# Patient Record
Sex: Female | Born: 1965 | Race: White | Hispanic: No | Smoking: Never smoker
Health system: Southern US, Community
[De-identification: ages and names within clinical notes are randomized; demographics above are authoritative.]

## PROBLEM LIST (undated history)

## (undated) DIAGNOSIS — C50919 Malignant neoplasm of unspecified site of unspecified female breast: Secondary | ICD-10-CM

## (undated) DIAGNOSIS — I509 Heart failure, unspecified: Secondary | ICD-10-CM

## (undated) DIAGNOSIS — D7581 Myelofibrosis: Secondary | ICD-10-CM

## (undated) DIAGNOSIS — C9590 Leukemia, unspecified not having achieved remission: Secondary | ICD-10-CM

## (undated) HISTORY — DX: Leukemia, unspecified not having achieved remission: C95.90

## (undated) HISTORY — PX: BREAST SURGERY: SHX581

## (undated) HISTORY — PX: ABDOMINAL HYSTERECTOMY: SHX81

---

## 1998-12-31 ENCOUNTER — Ambulatory Visit (HOSPITAL_COMMUNITY): Admission: RE | Admit: 1998-12-31 | Discharge: 1998-12-31 | Payer: Self-pay | Admitting: *Deleted

## 1999-01-09 ENCOUNTER — Inpatient Hospital Stay (HOSPITAL_COMMUNITY): Admission: RE | Admit: 1999-01-09 | Discharge: 1999-01-10 | Payer: Self-pay | Admitting: *Deleted

## 1999-02-18 ENCOUNTER — Encounter: Payer: Self-pay | Admitting: Oncology

## 1999-02-18 ENCOUNTER — Ambulatory Visit (HOSPITAL_COMMUNITY): Admission: RE | Admit: 1999-02-18 | Discharge: 1999-02-18 | Payer: Self-pay | Admitting: Oncology

## 1999-08-05 ENCOUNTER — Other Ambulatory Visit: Admission: RE | Admit: 1999-08-05 | Discharge: 1999-08-05 | Payer: Self-pay | Admitting: Obstetrics & Gynecology

## 1999-09-25 ENCOUNTER — Ambulatory Visit (HOSPITAL_BASED_OUTPATIENT_CLINIC_OR_DEPARTMENT_OTHER): Admission: RE | Admit: 1999-09-25 | Discharge: 1999-09-25 | Payer: Self-pay | Admitting: Plastic Surgery

## 2000-10-04 ENCOUNTER — Other Ambulatory Visit: Admission: RE | Admit: 2000-10-04 | Discharge: 2000-10-04 | Payer: Self-pay | Admitting: Obstetrics & Gynecology

## 2001-04-15 ENCOUNTER — Encounter (INDEPENDENT_AMBULATORY_CARE_PROVIDER_SITE_OTHER): Payer: Self-pay

## 2001-04-15 ENCOUNTER — Observation Stay (HOSPITAL_COMMUNITY): Admission: RE | Admit: 2001-04-15 | Discharge: 2001-04-16 | Payer: Self-pay | Admitting: Obstetrics & Gynecology

## 2002-08-17 ENCOUNTER — Other Ambulatory Visit: Admission: RE | Admit: 2002-08-17 | Discharge: 2002-08-17 | Payer: Self-pay | Admitting: Obstetrics & Gynecology

## 2003-10-05 ENCOUNTER — Other Ambulatory Visit: Admission: RE | Admit: 2003-10-05 | Discharge: 2003-10-05 | Payer: Self-pay | Admitting: Obstetrics & Gynecology

## 2004-10-28 ENCOUNTER — Other Ambulatory Visit: Admission: RE | Admit: 2004-10-28 | Discharge: 2004-10-28 | Payer: Self-pay | Admitting: Obstetrics & Gynecology

## 2004-10-29 ENCOUNTER — Ambulatory Visit: Payer: Self-pay | Admitting: Oncology

## 2005-04-29 ENCOUNTER — Ambulatory Visit: Payer: Self-pay | Admitting: Oncology

## 2005-06-26 ENCOUNTER — Ambulatory Visit: Payer: Self-pay | Admitting: Oncology

## 2005-09-10 ENCOUNTER — Ambulatory Visit: Payer: Self-pay | Admitting: Oncology

## 2005-11-23 DIAGNOSIS — C9590 Leukemia, unspecified not having achieved remission: Secondary | ICD-10-CM

## 2005-11-23 HISTORY — DX: Leukemia, unspecified not having achieved remission: C95.90

## 2005-12-15 ENCOUNTER — Other Ambulatory Visit: Admission: RE | Admit: 2005-12-15 | Discharge: 2005-12-15 | Payer: Self-pay | Admitting: Obstetrics & Gynecology

## 2006-01-04 ENCOUNTER — Ambulatory Visit: Payer: Self-pay | Admitting: Oncology

## 2006-02-19 ENCOUNTER — Ambulatory Visit: Payer: Self-pay | Admitting: Oncology

## 2006-05-17 ENCOUNTER — Ambulatory Visit: Payer: Self-pay | Admitting: Oncology

## 2006-05-20 LAB — CBC WITH DIFFERENTIAL/PLATELET
Basophils Absolute: 0 10*3/uL (ref 0.0–0.1)
Eosinophils Absolute: 0.1 10*3/uL (ref 0.0–0.5)
HCT: 37.5 % (ref 34.8–46.6)
HGB: 12.8 g/dL (ref 11.6–15.9)
MONO#: 0.4 10*3/uL (ref 0.1–0.9)
NEUT#: 5.4 10*3/uL (ref 1.5–6.5)
NEUT%: 70.7 % (ref 39.6–76.8)
WBC: 7.6 10*3/uL (ref 3.9–10.0)
lymph#: 1.8 10*3/uL (ref 0.9–3.3)

## 2006-09-14 ENCOUNTER — Ambulatory Visit: Payer: Self-pay | Admitting: Oncology

## 2006-09-16 LAB — CBC WITH DIFFERENTIAL/PLATELET
BASO%: 0.2 % (ref 0.0–2.0)
HCT: 36.3 % (ref 34.8–46.6)
LYMPH%: 22.5 % (ref 14.0–48.0)
MCH: 29 pg (ref 26.0–34.0)
MCHC: 34.2 g/dL (ref 32.0–36.0)
MONO#: 0.3 10*3/uL (ref 0.1–0.9)
NEUT%: 71.6 % (ref 39.6–76.8)
Platelets: 989 10*3/uL — ABNORMAL HIGH (ref 145–400)
WBC: 6.6 10*3/uL (ref 3.9–10.0)

## 2006-09-20 LAB — COMPREHENSIVE METABOLIC PANEL
ALT: 20 U/L (ref 0–40)
BUN: 11 mg/dL (ref 6–23)
CO2: 25 mEq/L (ref 19–32)
Creatinine, Ser: 0.65 mg/dL (ref 0.40–1.20)
Total Bilirubin: 0.7 mg/dL (ref 0.3–1.2)

## 2006-09-20 LAB — LACTATE DEHYDROGENASE: LDH: 440 U/L — ABNORMAL HIGH (ref 94–250)

## 2006-09-20 LAB — JAK2 GENOTYPR

## 2007-02-17 ENCOUNTER — Ambulatory Visit: Payer: Self-pay | Admitting: Oncology

## 2007-03-14 LAB — CBC WITH DIFFERENTIAL/PLATELET
Basophils Absolute: 0.1 10*3/uL (ref 0.0–0.1)
EOS%: 0.8 % (ref 0.0–7.0)
Eosinophils Absolute: 0.1 10*3/uL (ref 0.0–0.5)
HCT: 34.9 % (ref 34.8–46.6)
HGB: 12 g/dL (ref 11.6–15.9)
MCH: 29.4 pg (ref 26.0–34.0)
MCV: 85.4 fL (ref 81.0–101.0)
MONO%: 5.6 % (ref 0.0–13.0)
NEUT#: 5.4 10*3/uL (ref 1.5–6.5)
NEUT%: 73.7 % (ref 39.6–76.8)

## 2007-09-08 ENCOUNTER — Ambulatory Visit: Payer: Self-pay | Admitting: Oncology

## 2007-11-15 ENCOUNTER — Ambulatory Visit: Payer: Self-pay | Admitting: Oncology

## 2007-11-21 LAB — CBC WITH DIFFERENTIAL/PLATELET
Basophils Absolute: 0.1 10*3/uL (ref 0.0–0.1)
EOS%: 0.6 % (ref 0.0–7.0)
HGB: 11.2 g/dL — ABNORMAL LOW (ref 11.6–15.9)
LYMPH%: 14.6 % (ref 14.0–48.0)
MCH: 29.8 pg (ref 26.0–34.0)
MCV: 86.6 fL (ref 81.0–101.0)
MONO%: 4.1 % (ref 0.0–13.0)
Platelets: 502 10*3/uL — ABNORMAL HIGH (ref 145–400)
RBC: 3.77 10*6/uL (ref 3.70–5.32)
RDW: 17.9 % — ABNORMAL HIGH (ref 11.3–14.5)

## 2007-11-23 ENCOUNTER — Other Ambulatory Visit: Admission: RE | Admit: 2007-11-23 | Discharge: 2007-11-23 | Payer: Self-pay | Admitting: Oncology

## 2007-11-23 ENCOUNTER — Encounter: Payer: Self-pay | Admitting: Oncology

## 2007-11-23 LAB — COMPREHENSIVE METABOLIC PANEL
ALT: 12 U/L (ref 0–35)
AST: 16 U/L (ref 0–37)
Albumin: 4.4 g/dL (ref 3.5–5.2)
Alkaline Phosphatase: 50 U/L (ref 39–117)
BUN: 8 mg/dL (ref 6–23)
CO2: 25 mEq/L (ref 19–32)
Calcium: 9.3 mg/dL (ref 8.4–10.5)
Chloride: 104 mEq/L (ref 96–112)
Creatinine, Ser: 0.61 mg/dL (ref 0.40–1.20)
Glucose, Bld: 105 mg/dL — ABNORMAL HIGH (ref 70–99)
Potassium: 3.9 mEq/L (ref 3.5–5.3)
Sodium: 141 mEq/L (ref 135–145)
Total Bilirubin: 0.6 mg/dL (ref 0.3–1.2)
Total Protein: 7 g/dL (ref 6.0–8.3)

## 2007-12-23 LAB — CBC & DIFF AND RETIC
Basophils Absolute: 0 10*3/uL (ref 0.0–0.1)
Eosinophils Absolute: 0 10*3/uL (ref 0.0–0.5)
HCT: 33.7 % — ABNORMAL LOW (ref 34.8–46.6)
HGB: 11.3 g/dL — ABNORMAL LOW (ref 11.6–15.9)
LYMPH%: 19.3 % (ref 14.0–48.0)
MONO#: 0.3 10*3/uL (ref 0.1–0.9)
NEUT#: 5.4 10*3/uL (ref 1.5–6.5)
NEUT%: 75.4 % (ref 39.6–76.8)
Platelets: 555 10*3/uL — ABNORMAL HIGH (ref 145–400)
RBC: 3.88 10*6/uL (ref 3.70–5.32)
Retic %: 2.8 % — ABNORMAL HIGH (ref 0.4–2.3)
WBC: 7.1 10*3/uL (ref 3.9–10.0)

## 2007-12-23 LAB — CHCC SMEAR

## 2008-01-18 ENCOUNTER — Ambulatory Visit: Payer: Self-pay | Admitting: Oncology

## 2008-02-23 ENCOUNTER — Ambulatory Visit: Payer: Self-pay | Admitting: Internal Medicine

## 2008-02-23 DIAGNOSIS — L03211 Cellulitis of face: Secondary | ICD-10-CM

## 2008-02-23 DIAGNOSIS — L0201 Cutaneous abscess of face: Secondary | ICD-10-CM | POA: Insufficient documentation

## 2008-02-23 DIAGNOSIS — D7581 Myelofibrosis: Secondary | ICD-10-CM | POA: Insufficient documentation

## 2008-03-09 ENCOUNTER — Telehealth (INDEPENDENT_AMBULATORY_CARE_PROVIDER_SITE_OTHER): Payer: Self-pay | Admitting: *Deleted

## 2008-04-17 ENCOUNTER — Ambulatory Visit: Payer: Self-pay | Admitting: Oncology

## 2008-04-19 LAB — CBC WITH DIFFERENTIAL/PLATELET
Eosinophils Absolute: 0.1 10*3/uL (ref 0.0–0.5)
HCT: 32.4 % — ABNORMAL LOW (ref 34.8–46.6)
LYMPH%: 16.9 % (ref 14.0–48.0)
MONO#: 0.4 10*3/uL (ref 0.1–0.9)
NEUT#: 5.5 10*3/uL (ref 1.5–6.5)
NEUT%: 76.1 % (ref 39.6–76.8)
Platelets: 501 10*3/uL — ABNORMAL HIGH (ref 145–400)
WBC: 7.3 10*3/uL (ref 3.9–10.0)

## 2008-07-17 ENCOUNTER — Ambulatory Visit: Payer: Self-pay | Admitting: Oncology

## 2008-07-19 LAB — CBC WITH DIFFERENTIAL/PLATELET
Basophils Absolute: 0 10*3/uL (ref 0.0–0.1)
EOS%: 0.6 % (ref 0.0–7.0)
Eosinophils Absolute: 0.1 10*3/uL (ref 0.0–0.5)
HGB: 11.4 g/dL — ABNORMAL LOW (ref 11.6–15.9)
LYMPH%: 15.4 % (ref 14.0–48.0)
MCH: 30.3 pg (ref 26.0–34.0)
MCV: 87.8 fL (ref 81.0–101.0)
MONO%: 6.4 % (ref 0.0–13.0)
Platelets: 440 10*3/uL — ABNORMAL HIGH (ref 145–400)
RBC: 3.78 10*6/uL (ref 3.70–5.32)
RDW: 17.3 % — ABNORMAL HIGH (ref 11.3–14.5)

## 2008-10-05 ENCOUNTER — Ambulatory Visit: Payer: Self-pay | Admitting: Oncology

## 2008-10-09 ENCOUNTER — Ambulatory Visit (HOSPITAL_COMMUNITY): Admission: RE | Admit: 2008-10-09 | Discharge: 2008-10-09 | Payer: Self-pay | Admitting: Oncology

## 2008-10-09 LAB — CBC WITH DIFFERENTIAL/PLATELET
BASO%: 1 % (ref 0.0–2.0)
Basophils Absolute: 0.1 10*3/uL (ref 0.0–0.1)
EOS%: 0.6 % (ref 0.0–7.0)
HCT: 35.2 % (ref 34.8–46.6)
HGB: 11.8 g/dL (ref 11.6–15.9)
LYMPH%: 16.9 % (ref 14.0–48.0)
MCH: 29.4 pg (ref 26.0–34.0)
MCHC: 33.5 g/dL (ref 32.0–36.0)
MCV: 87.6 fL (ref 81.0–101.0)
NEUT%: 76.5 % (ref 39.6–76.8)
Platelets: 371 10*3/uL (ref 145–400)

## 2008-12-06 ENCOUNTER — Ambulatory Visit: Payer: Self-pay | Admitting: Oncology

## 2008-12-10 LAB — CBC WITH DIFFERENTIAL/PLATELET
Basophils Absolute: 0 10*3/uL (ref 0.0–0.1)
Eosinophils Absolute: 0.1 10*3/uL (ref 0.0–0.5)
HCT: 33.7 % — ABNORMAL LOW (ref 34.8–46.6)
HGB: 11.4 g/dL — ABNORMAL LOW (ref 11.6–15.9)
LYMPH%: 16.5 % (ref 14.0–48.0)
MCHC: 33.8 g/dL (ref 32.0–36.0)
MONO#: 0.1 10*3/uL (ref 0.1–0.9)
NEUT#: 5.9 10*3/uL (ref 1.5–6.5)
NEUT%: 80.4 % — ABNORMAL HIGH (ref 39.6–76.8)
Platelets: 421 10*3/uL — ABNORMAL HIGH (ref 145–400)
WBC: 7.3 10*3/uL (ref 3.9–10.0)

## 2009-03-15 ENCOUNTER — Ambulatory Visit: Payer: Self-pay | Admitting: Oncology

## 2009-03-15 LAB — CBC WITH DIFFERENTIAL/PLATELET
BASO%: 0 % (ref 0.0–2.0)
Basophils Absolute: 0 10*3/uL (ref 0.0–0.1)
EOS%: 0.3 % (ref 0.0–7.0)
HCT: 31.9 % — ABNORMAL LOW (ref 34.8–46.6)
HGB: 11 g/dL — ABNORMAL LOW (ref 11.6–15.9)
LYMPH%: 15.3 % (ref 14.0–49.7)
MCH: 29.6 pg (ref 25.1–34.0)
MCHC: 34.5 g/dL (ref 31.5–36.0)
NEUT%: 78.7 % — ABNORMAL HIGH (ref 38.4–76.8)
Platelets: 344 10*3/uL (ref 145–400)
lymph#: 1.1 10*3/uL (ref 0.9–3.3)

## 2009-06-06 ENCOUNTER — Ambulatory Visit: Payer: Self-pay | Admitting: Oncology

## 2009-06-25 LAB — CBC WITH DIFFERENTIAL/PLATELET
Basophils Absolute: 0 10*3/uL (ref 0.0–0.1)
Eosinophils Absolute: 0.1 10*3/uL (ref 0.0–0.5)
HCT: 35.2 % (ref 34.8–46.6)
HGB: 11.9 g/dL (ref 11.6–15.9)
LYMPH%: 17.4 % (ref 14.0–49.7)
MCV: 88.1 fL (ref 79.5–101.0)
MONO#: 0.5 10*3/uL (ref 0.1–0.9)
MONO%: 5.5 % (ref 0.0–14.0)
NEUT#: 6.6 10*3/uL — ABNORMAL HIGH (ref 1.5–6.5)
NEUT%: 76 % (ref 38.4–76.8)
Platelets: 393 10*3/uL (ref 145–400)
WBC: 8.7 10*3/uL (ref 3.9–10.3)

## 2009-10-22 ENCOUNTER — Ambulatory Visit: Payer: Self-pay | Admitting: Oncology

## 2009-10-24 LAB — CBC WITH DIFFERENTIAL/PLATELET
BASO%: 0.7 % (ref 0.0–2.0)
Basophils Absolute: 0.1 10*3/uL (ref 0.0–0.1)
Eosinophils Absolute: 0 10*3/uL (ref 0.0–0.5)
HCT: 33.6 % — ABNORMAL LOW (ref 34.8–46.6)
HGB: 10.9 g/dL — ABNORMAL LOW (ref 11.6–15.9)
LYMPH%: 15 % (ref 14.0–49.7)
MCHC: 32.4 g/dL (ref 31.5–36.0)
MONO#: 0.5 10*3/uL (ref 0.1–0.9)
NEUT%: 78.2 % — ABNORMAL HIGH (ref 38.4–76.8)
Platelets: 321 10*3/uL (ref 145–400)
WBC: 8.1 10*3/uL (ref 3.9–10.3)
lymph#: 1.2 10*3/uL (ref 0.9–3.3)

## 2009-12-11 ENCOUNTER — Ambulatory Visit: Payer: Self-pay | Admitting: Family Medicine

## 2009-12-11 DIAGNOSIS — R030 Elevated blood-pressure reading, without diagnosis of hypertension: Secondary | ICD-10-CM | POA: Insufficient documentation

## 2009-12-11 DIAGNOSIS — S20229A Contusion of unspecified back wall of thorax, initial encounter: Secondary | ICD-10-CM | POA: Insufficient documentation

## 2010-02-20 ENCOUNTER — Ambulatory Visit: Payer: Self-pay | Admitting: Oncology

## 2010-02-24 LAB — CBC WITH DIFFERENTIAL/PLATELET
HCT: 34.8 % (ref 34.8–46.6)
HGB: 12.1 g/dL (ref 11.6–15.9)
MCH: 31.6 pg (ref 25.1–34.0)
MCHC: 34.7 g/dL (ref 31.5–36.0)
MCV: 91.4 fL (ref 79.5–101.0)
Platelets: 369 10*3/uL (ref 145–400)
RBC: 3.81 10*6/uL (ref 3.70–5.45)
RDW: 17 % — ABNORMAL HIGH (ref 11.2–14.5)
WBC: 9.1 10*3/uL (ref 3.9–10.3)

## 2010-02-24 LAB — MANUAL DIFFERENTIAL
ALC: 1.1 10*3/uL (ref 0.9–3.3)
ANC (CHCC manual diff): 7.4 10*3/uL — ABNORMAL HIGH (ref 1.5–6.5)
Band Neutrophils: 16 % — ABNORMAL HIGH (ref 0–10)
Blasts: 1 % — ABNORMAL HIGH (ref 0–0)
LYMPH: 12 % — ABNORMAL LOW (ref 14–49)
Other Cell: 0 % (ref 0–0)
PROMYELO: 0 % (ref 0–0)
SEG: 53 % (ref 38–77)
Variant Lymph: 0 % (ref 0–0)
nRBC: 1 % — ABNORMAL HIGH (ref 0–0)

## 2010-02-24 LAB — CHCC SMEAR

## 2010-09-18 ENCOUNTER — Ambulatory Visit: Payer: Self-pay | Admitting: Oncology

## 2010-09-19 ENCOUNTER — Ambulatory Visit: Payer: Self-pay | Admitting: Internal Medicine

## 2010-09-22 LAB — MANUAL DIFFERENTIAL
ALC: 1.4 10*3/uL (ref 0.9–3.3)
ANC (CHCC manual diff): 7.5 10*3/uL — ABNORMAL HIGH (ref 1.5–6.5)
Band Neutrophils: 22 % — ABNORMAL HIGH (ref 0–10)
Basophil: 0 % (ref 0–2)
Blasts: 2 % — ABNORMAL HIGH (ref 0–0)
EOS: 0 % (ref 0–7)
LYMPH: 15 % (ref 14–49)
MONO: 5 % (ref 0–14)
Metamyelocytes: 8 % — ABNORMAL HIGH (ref 0–0)
Myelocytes: 8 % — ABNORMAL HIGH (ref 0–0)
Other Cell: 0 % (ref 0–0)
PLT EST: ADEQUATE
PROMYELO: 0 % (ref 0–0)
SEG: 40 % (ref 38–77)
Variant Lymph: 0 % (ref 0–0)
nRBC: 4 % — ABNORMAL HIGH (ref 0–0)

## 2010-09-22 LAB — CBC WITH DIFFERENTIAL/PLATELET
HCT: 37.4 % (ref 34.8–46.6)
HGB: 12.2 g/dL (ref 11.6–15.9)
MCH: 29.4 pg (ref 25.1–34.0)
MCHC: 32.6 g/dL (ref 31.5–36.0)
MCV: 90.1 fL (ref 79.5–101.0)
Platelets: 250 10*3/uL (ref 145–400)
RBC: 4.15 10*6/uL (ref 3.70–5.45)
RDW: 16.4 % — ABNORMAL HIGH (ref 11.2–14.5)
WBC: 9.6 10*3/uL (ref 3.9–10.3)

## 2010-12-11 ENCOUNTER — Ambulatory Visit: Payer: Self-pay | Admitting: Oncology

## 2010-12-15 LAB — CBC WITH DIFFERENTIAL/PLATELET
HCT: 33.8 % — ABNORMAL LOW (ref 34.8–46.6)
HGB: 11.4 g/dL — ABNORMAL LOW (ref 11.6–15.9)
MCH: 29.7 pg (ref 25.1–34.0)
MCHC: 33.9 g/dL (ref 31.5–36.0)
MCV: 87.6 fL (ref 79.5–101.0)
Platelets: 265 10*3/uL (ref 145–400)
RBC: 3.86 10*6/uL (ref 3.70–5.45)
RDW: 17 % — ABNORMAL HIGH (ref 11.2–14.5)
WBC: 9.4 10*3/uL (ref 3.9–10.3)

## 2010-12-15 LAB — MANUAL DIFFERENTIAL
ALC: 1.3 10*3/uL (ref 0.9–3.3)
ANC (CHCC manual diff): 7.4 10*3/uL — ABNORMAL HIGH (ref 1.5–6.5)
Band Neutrophils: 24 % — ABNORMAL HIGH (ref 0–10)
Basophil: 1 % (ref 0–2)
Blasts: 2 % — ABNORMAL HIGH (ref 0–0)
EOS: 2 % (ref 0–7)
LYMPH: 14 % (ref 14–49)
MONO: 2 % (ref 0–14)
Metamyelocytes: 4 % — ABNORMAL HIGH (ref 0–0)
Myelocytes: 5 % — ABNORMAL HIGH (ref 0–0)
Other Cell: 0 % (ref 0–0)
PLT EST: ADEQUATE
PROMYELO: 0 % (ref 0–0)
SEG: 46 % (ref 38–77)
Variant Lymph: 0 % (ref 0–0)
nRBC: 4 % — ABNORMAL HIGH (ref 0–0)

## 2010-12-25 NOTE — Assessment & Plan Note (Signed)
Summary: FALL / BACK PAIN // RS   Vital Signs:  Patient profile:   45 year old female BP sitting:   170 / 80  Serial Vital Signs/Assessments:  Time      Position  BP       Pulse  Resp  Temp     By                     170/80                         Evelena Peat MD   History of Present Illness: Acute visit. Larey Seat last Friday on ice and landed on her sacrum and lumbar area. Had some bruising and swelling right away. Pain is actually somewhat better today. Initially had some radiation down right lower extremity but no radiation now. Denies any numbness, weakness, or incontinence symptoms. Advil helps. Waking up frequently at night with pain at times when rolling over in bed.  Allergies: 1)  ! 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  2002 for uterine fibroids  Past Surgical History: Last updated: 02/23/2008 status post left mastectomy 2000 Hysterectomy 02 Mastectomy  00  Review of Systems      See HPI  Physical Exam  General:  Well-developed,well-nourished,in no acute distress; alert,appropriate and cooperative throughout examination Msk:  patient has fairly extensive bruising over the lower lumbar and sacral area. Localized diffuse tenderness. Straight leg raise is negative bilaterally. Able to flex and extend back. Neurologic:  full strength lower extremities and symmetric reflexes. Normal sensory function.   Impression & Recommendations:  Problem # 1:  CONTUSION OF BACK (ICD-922.31) suspect contusion. She is improved today compared a few days ago. Vicodin as needed for pain relief  Problem # 2:  ELEVATED BLOOD PRESSURE (ICD-796.2) Rec she have rechecked within the next couple of weeks and f/u with primary physician if still up.  Complete Medication  List: 1)  Avelox 400 Mg Tabs (Moxifloxacin hcl) 2)  Doxycycline Hyclate 100 Mg Caps (Doxycycline hyclate) .... One two times a day #20 3)  Hydrocodone-acetaminophen 5-325 Mg Tabs (Hydrocodone-acetaminophen) .Marland Kitchen.. 1-2 by mouth  q 4-6 hours as needed pain.  Patient Instructions: 1)  Followup with your primary physician in a couple weeks if no better. Prescriptions: HYDROCODONE-ACETAMINOPHEN 5-325 MG TABS (HYDROCODONE-ACETAMINOPHEN) 1-2 by mouth  q 4-6 hours as needed pain.  #30 x 0   Entered and Authorized by:   Evelena Peat MD   Signed by:   Evelena Peat MD on 12/11/2009   Method used:   Print then Give to Patient   RxID:   [id number]

## 2010-12-25 NOTE — Assessment & Plan Note (Signed)
Summary: BP F/U // RS----PT [facility] // RS   Vital Signs:  Patient profile:   45 year old female Weight:      165 pounds BMI:     25.94 Temp:     98.2 degrees F oral BP sitting:   162 / 84  (right arm) Cuff size:   regular  Vitals Entered By: Alfred Levins, CMA (September 19, 2010 1:04 PM) CC: bp f/u   CC:  bp f/u.  History of Present Illness: 45 year old patient who has enjoyed excellent health, although there have been some blood pressure concerns over the years.  She is followed closely by oncology due to myelofibrosis.  Serial blood pressure readings over the past couple weeks.  Have revealed high blood pressure readings approximately 50% of the time.  She has a strong family history of hypertension with her brother and both parents affected.  She describes her salt intake as generous  Current Medications (verified): 1)  None  Allergies (verified): 1)  ! Biaxin (Clarithromycin)  Past History:  Past Medical History: Reviewed history from 02/23/2008 and no changes required. 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  2002 for uterine fibroids  Past Surgical History: Reviewed history from 02/23/2008 and no changes required. status post left mastectomy 2000 Hysterectomy 02 Mastectomy  00  Physical Exam  General:  Well-developed,well-nourished,in no acute distress; alert,appropriate and cooperative throughout examination; 140/90 Head:  Normocephalic and atraumatic without obvious abnormalities. No apparent alopecia or balding. Eyes:  No corneal or conjunctival inflammation noted. EOMI. Perrla. Funduscopic exam benign, without hemorrhages, exudates or papilledema. Vision grossly normal. Mouth:  Oral mucosa and oropharynx without lesions or exudates.  Teeth in good repair. Neck:  No deformities, masses, or tenderness noted. Lungs:  Normal respiratory effort,  chest expands symmetrically. Lungs are clear to auscultation, no crackles or wheezes. Heart:  Normal rate and regular rhythm. S1 and S2 normal without gallop, murmur, click, rub or other extra sounds. Abdomen:  marked splenomegaly   Impression & Recommendations:  Problem # 1:  ELEVATED BLOOD PRESSURE (ICD-796.2) options were discussed, including initiating blood pressure medications today.  Have elected to moderate her salt intake and attempt to exercise more regularly.  She will continue to monitor her blood pressure carefully and if consistently higher than 150/9.  He will contact the office and initiate treatment.  Otherwise, will reassess in 6 weeks.  She has been asked to keep a diary of her blood pressure readings and to bring her home blood pressure monitor on her next visit  Patient Instructions: 1)  Limit your Sodium (Salt). 2)  It is important that you exercise regularly at least 20 minutes 5 times a week. If you develop chest pain, have severe difficulty breathing, or feel very tired , stop exercising immediately and seek medical attention. 3)  Check your Blood Pressure regularly. If it is above: 150/90 you should make an appointment. 4)  Please schedule a follow-up appointment in 2 months.   Orders Added: 1)  Est. Patient Level III [16109]

## 2011-03-23 ENCOUNTER — Other Ambulatory Visit: Payer: Self-pay | Admitting: Oncology

## 2011-03-23 ENCOUNTER — Encounter (HOSPITAL_BASED_OUTPATIENT_CLINIC_OR_DEPARTMENT_OTHER): Payer: PRIVATE HEALTH INSURANCE | Admitting: Oncology

## 2011-03-23 DIAGNOSIS — R161 Splenomegaly, not elsewhere classified: Secondary | ICD-10-CM

## 2011-03-23 DIAGNOSIS — Z853 Personal history of malignant neoplasm of breast: Secondary | ICD-10-CM

## 2011-03-23 DIAGNOSIS — D7581 Myelofibrosis: Secondary | ICD-10-CM

## 2011-03-23 LAB — MANUAL DIFFERENTIAL
Band Neutrophils: 23 % — ABNORMAL HIGH (ref 0–10)
LYMPH: 18 % (ref 14–49)
Metamyelocytes: 7 % — ABNORMAL HIGH (ref 0–0)
SEG: 37 % — ABNORMAL LOW (ref 38–77)

## 2011-03-23 LAB — CBC WITH DIFFERENTIAL/PLATELET
HCT: 31.6 % — ABNORMAL LOW (ref 34.8–46.6)
HGB: 10.7 g/dL — ABNORMAL LOW (ref 11.6–15.9)
MCH: 29.2 pg (ref 25.1–34.0)
MCV: 86.3 fL (ref 79.5–101.0)
Platelets: 264 10*3/uL (ref 145–400)
RDW: 16 % — ABNORMAL HIGH (ref 11.2–14.5)
WBC: 6.4 10*3/uL (ref 3.9–10.3)

## 2011-04-10 NOTE — Op Note (Signed)
[facility]  Patient:    [name], [name]                     MRN: [id number] Proc. Date: 04/15/01 Adm. Date:  [id number] Disc. Date: [id number] Attending:  Minette Headland                           Operative Report  PREOPERATIVE DIAGNOSES:       Uterine leiomyomata.  POSTOPERATIVE DIAGNOSES:      Uterine leiomyomata.  PROCEDURE:                    Laparoscopically assisted vaginal hysterectomy.  SURGEON:                      Freddy Finner, M.D.  ASSISTANT:                    Marcelle Overlie, M.D.  ESTIMATED BLOOD LOSS:         700 cc.  COMPLICATIONS:                None.  INDICATIONS:                  The patient is a 45 year old breast cancer survivor who has had significant dysfunctional bleeding with large uterine leiomyomata, particularly one which appeared to be degenerating and was approximately 10 cm and submucous in location.  She is admitted now for LAVH.  DESCRIPTION OF PROCEDURE:     Intraoperative findings were a very enlarged uterus, but no other abnormal pelvic findings were noted.  Tubes and ovaries were normal.  The appendix was normal.  There was no apparent abnormality of the upper abdomen.  The patient was admitted on the morning of surgery, brought to the operating room, placed under adequate general endotracheal anesthesia, placed in the dorsal lithotomy position using the Allen stirrups system.  Betadine prep of abdomen, perineum, and vagina was carried out in the usual fashion with scrub followed by solution.  Bladder was evacuated with a Robinson catheter.  A Hulka tenaculum was attached to the cervix.  Sterile drapes were applied.  A total of three incisions were made in the abdomen, one at the umbilicus, one just above the symphysis, and one in the right lower quadrant which was done while transilluminating the abdominal wall.  Through the umbilical incision an 11 mm disposable trocar was placed while elevating  the anterior abdominal wall manually.  Direct inspection revealed adequate placement with no evidence of injury on entry.  Pneumoperitoneum was allowed to accumulate.  A 5 mm trocar was placed through the lower incision and a blunt probe and Leonard grasping forceps used through this trocar site.  After assessing the abdominal and pelvic contents it was elected to proceed with the laparoscopic portion of the procedure and a third incision was made in the right lower quadrant through which a 5 mm trocar was placed under direct visualization and a 5 mm laparoscope used while using the LigaSure forceps at the umbilical port to develop the broad ligaments on each side.  This was done without significant difficulty.  Attention was then turned vaginally.  The large cervix compromised the exposure to some degree but with great care and persistence the procedure was completed vaginally.  Initially the cervix was grasped with a Christella Hartigan tenaculum.  A colpotomy incision was made while tenting the  mucosa posterior to the cervix.  The cervix was circumscribed with a scalpel to release the mucosa with progressive pedicles.  Using the LigaSure system, the uterosacrals, bladder pillars, cardinal ligament pedicles, and vessel pedicles were developed.  The peritoneum anteriorly was entered.  The uterus was morcellated and cored to allow complete removal.  This was eventually accomplished without significant complications.  There was a significant blood loss related to this but no other complications.  After removing the uterus, the angles of the vagina were anchored to the uterosacrals with 0 Monocryl and mattress suture.  Uterosacrals were plicated with 0 Monocryl suture closing the posterior peritoneum.  The cuff was closed vertically with figure-of-eights of 0 Monocryl.  A Foley catheter was placed.  ______ abdominally was carried out and a small bleeding source at the right uterosacral ligament area of  pedicles was controlled with the bipolar forceps. Irrigation was carried out using the Nezhat irrigation system.  With complete hemostasis, all instruments were removed, gas was allowed to escape from the abdomen.  The skin incisions were closed with interrupted subcuticular sutures of 3-0 Dexon. Steri-Strips were applied to the two 5 mm incisions also. Patient tolerated the procedure well and was taken to the recovery room in good condition. DD:  04/15/01 TD:  04/15/01 Job: [id number]

## 2011-04-10 NOTE — Discharge Summary (Signed)
[facility]  Patient:    [name], [name]                     MRN: [id number] Adm. Date:  [id number] Disc. Date: 04/16/01 Attending:  Minette Headland CC:         Leighton Roach. Truett Perna, M.D.   Discharge Summary  DISCHARGE DIAGNOSES:          Uterine leiomyomata.  PROCEDURE:                    Laparoscopically assisted vaginal hysterectomy.  COMPLICATIONS:                None.  DISPOSITION:                  Patient is in satisfactory and improved condition at the time of her discharge.  She is to have progressively increasing activity, but no vaginal entry, no heavy lifting.  She is to report fever above 100.  She is to report severe pain or heavy bleeding.  She is to return to my office in two weeks for postoperative followup.  She is to take a regular diet.  She is given Percocet 5 mg to be taken one to two q.4h. p.r.n. for pain.  She is to continue with her Tamoxifen.  SECONDARY DIAGNOSIS:          Breast cancer, treated with no evidence of recurrence.  HISTORY OF PRESENT ILLNESS:   Details of the present illness are recorded in the admission note.  Briefly, the patient has a history of dysfunctional bleeding and has large uterine leiomyomata including one which appeared to be degenerating and was measured at 10 cm.  PHYSICAL EXAMINATION  BREASTS:                      Her physical findings on admission were primarily remarkable for the reconstructed left breast.  Right breast was normal.  PELVIC:                       Other physical findings were normal except for the pelvic examination with marked enlargement of the uterus.  LABORATORIES:                 Normal CBC on admission with hemoglobin of 13.9, platelet count was slightly elevated at 410,000.  Postoperatively her hemoglobin was 10.4 on the day of surgery at approximately 4 p.m. which was approximately six hours postoperative.  On the following morning the first postoperative  day, the day of discharge the hemoglobin was 10.5.  Platelet count on both occasions was normal.  Admission prothrombin time, PTT, and INR were normal.  Admission urinalysis was normal.  HOSPITAL COURSE:              Patient was admitted on the morning of surgery. She was treated perioperatively with IV Cefotan.  She had PAS compression hose intraoperatively and postoperatively until the morning after surgery.  The above described procedure was accomplished without difficulty.  Her postoperative course was unremarkable except for some nausea which was fairly profound on the morning after surgery, but resolved with Zofran IV.  By the afternoon of the first postoperative day her condition was considered to be satisfactory for discharge and she was discharged home with disposition as noted above. DD:  04/16/01 TD:  04/16/01 Job: [id number]

## 2011-06-15 ENCOUNTER — Other Ambulatory Visit: Payer: Self-pay | Admitting: Oncology

## 2011-06-15 ENCOUNTER — Encounter (HOSPITAL_BASED_OUTPATIENT_CLINIC_OR_DEPARTMENT_OTHER): Payer: PRIVATE HEALTH INSURANCE | Admitting: Oncology

## 2011-06-15 DIAGNOSIS — D7581 Myelofibrosis: Secondary | ICD-10-CM

## 2011-06-15 DIAGNOSIS — R161 Splenomegaly, not elsewhere classified: Secondary | ICD-10-CM

## 2011-06-15 DIAGNOSIS — Z853 Personal history of malignant neoplasm of breast: Secondary | ICD-10-CM

## 2011-06-15 LAB — CBC WITH DIFFERENTIAL/PLATELET
HGB: 10.8 g/dL — ABNORMAL LOW (ref 11.6–15.9)
Platelets: 237 10*3/uL (ref 145–400)
RBC: 3.63 10*6/uL — ABNORMAL LOW (ref 3.70–5.45)
RDW: 16.9 % — ABNORMAL HIGH (ref 11.2–14.5)
WBC: 7.6 10*3/uL (ref 3.9–10.3)

## 2011-06-15 LAB — MANUAL DIFFERENTIAL
ALC: 1.1 10*3/uL (ref 0.9–3.3)
Band Neutrophils: 17 % — ABNORMAL HIGH (ref 0–10)
MONO: 3 % (ref 0–14)
Myelocytes: 6 % — ABNORMAL HIGH (ref 0–0)
Other Cell: 0 % (ref 0–0)
PROMYELO: 0 % (ref 0–0)
SEG: 51 % (ref 38–77)
Variant Lymph: 0 % (ref 0–0)

## 2011-08-28 LAB — CHROMOSOME ANALYSIS, BONE MARROW

## 2011-08-28 LAB — BONE MARROW EXAM

## 2011-10-13 ENCOUNTER — Other Ambulatory Visit: Payer: Self-pay | Admitting: Radiology

## 2011-10-19 ENCOUNTER — Other Ambulatory Visit (HOSPITAL_BASED_OUTPATIENT_CLINIC_OR_DEPARTMENT_OTHER): Payer: PRIVATE HEALTH INSURANCE | Admitting: Lab

## 2011-10-19 ENCOUNTER — Telehealth: Payer: Self-pay | Admitting: Oncology

## 2011-10-19 ENCOUNTER — Other Ambulatory Visit: Payer: Self-pay | Admitting: Oncology

## 2011-10-19 ENCOUNTER — Ambulatory Visit: Payer: PRIVATE HEALTH INSURANCE | Admitting: Oncology

## 2011-10-19 DIAGNOSIS — Z853 Personal history of malignant neoplasm of breast: Secondary | ICD-10-CM

## 2011-10-19 DIAGNOSIS — D7581 Myelofibrosis: Secondary | ICD-10-CM

## 2011-10-19 DIAGNOSIS — R161 Splenomegaly, not elsewhere classified: Secondary | ICD-10-CM

## 2011-10-19 LAB — MANUAL DIFFERENTIAL
ALC: 1.1 10*3/uL (ref 0.9–3.3)
ANC (CHCC manual diff): 5.9 10*3/uL (ref 1.5–6.5)
Blasts: 1 % — ABNORMAL HIGH (ref 0–0)
LYMPH: 14 % (ref 14–49)
Metamyelocytes: 5 % — ABNORMAL HIGH (ref 0–0)
Myelocytes: 3 % — ABNORMAL HIGH (ref 0–0)
Variant Lymph: 0 % (ref 0–0)

## 2011-10-19 LAB — CBC WITH DIFFERENTIAL/PLATELET
HCT: 32.5 % — ABNORMAL LOW (ref 34.8–46.6)
MCHC: 34.2 g/dL (ref 31.5–36.0)
WBC: 8.2 10*3/uL (ref 3.9–10.3)

## 2011-10-19 NOTE — Telephone Encounter (Signed)
Pt came in late and was not able to be seen. Pt had lbs and f/u appt was r/s to 1/18 @ 11:30 am. D/t per pt.

## 2011-10-27 ENCOUNTER — Telehealth: Payer: Self-pay | Admitting: *Deleted

## 2011-10-27 NOTE — Telephone Encounter (Signed)
Patient notified that 11/26 CBC is stable. Follow up as scheduled.

## 2011-11-20 ENCOUNTER — Ambulatory Visit: Payer: PRIVATE HEALTH INSURANCE | Admitting: Family Medicine

## 2011-11-29 ENCOUNTER — Other Ambulatory Visit: Payer: Self-pay | Admitting: Oncology

## 2011-11-29 DIAGNOSIS — Z853 Personal history of malignant neoplasm of breast: Secondary | ICD-10-CM

## 2011-11-29 DIAGNOSIS — D473 Essential (hemorrhagic) thrombocythemia: Secondary | ICD-10-CM

## 2011-12-11 ENCOUNTER — Ambulatory Visit: Payer: PRIVATE HEALTH INSURANCE | Admitting: Oncology

## 2012-02-19 ENCOUNTER — Other Ambulatory Visit: Payer: Self-pay | Admitting: Oncology

## 2012-02-22 ENCOUNTER — Telehealth: Payer: Self-pay | Admitting: Oncology

## 2012-02-22 ENCOUNTER — Other Ambulatory Visit: Payer: Self-pay | Admitting: *Deleted

## 2012-02-22 DIAGNOSIS — D7581 Myelofibrosis: Secondary | ICD-10-CM

## 2012-02-22 NOTE — Telephone Encounter (Signed)
pt called and r/s missed appt on 01/18 to 04/29

## 2012-03-21 ENCOUNTER — Telehealth: Payer: Self-pay | Admitting: Oncology

## 2012-03-21 ENCOUNTER — Ambulatory Visit: Payer: PRIVATE HEALTH INSURANCE | Admitting: Oncology

## 2012-03-21 ENCOUNTER — Ambulatory Visit (HOSPITAL_BASED_OUTPATIENT_CLINIC_OR_DEPARTMENT_OTHER): Payer: PRIVATE HEALTH INSURANCE | Admitting: Nurse Practitioner

## 2012-03-21 ENCOUNTER — Other Ambulatory Visit (HOSPITAL_BASED_OUTPATIENT_CLINIC_OR_DEPARTMENT_OTHER): Payer: PRIVATE HEALTH INSURANCE | Admitting: Lab

## 2012-03-21 VITALS — BP 153/91 | HR 76 | Temp 97.1°F | Ht 67.0 in | Wt 168.8 lb

## 2012-03-21 DIAGNOSIS — Z853 Personal history of malignant neoplasm of breast: Secondary | ICD-10-CM

## 2012-03-21 DIAGNOSIS — D7581 Myelofibrosis: Secondary | ICD-10-CM

## 2012-03-21 DIAGNOSIS — R509 Fever, unspecified: Secondary | ICD-10-CM

## 2012-03-21 DIAGNOSIS — M899 Disorder of bone, unspecified: Secondary | ICD-10-CM

## 2012-03-21 NOTE — Telephone Encounter (Signed)
lab was closed pt will come back in 05/01 to have labs done. gv pt appt for [id number]

## 2012-03-21 NOTE — Progress Notes (Signed)
OFFICE PROGRESS NOTE  Interval history:  [name] is a 46 year old woman with myelofibrosis. She also has a history of stage II breast cancer February 2000. She is seen today for scheduled followup.  [name] reports that overall she feels well. Aside from a "stomach bug", interim illnesses or infections. She has intermittent left-sided abdominal pain. No nausea or vomiting. No fevers or sweats. She continues to have diffuse bone pain. The pain is relieved with ibuprofen daily.   Objective: Blood pressure 153/91, pulse 76, temperature 97.1 F (36.2 C), temperature source Oral, height 5\' 7"  (1.702 m), weight 168 lb 12.8 oz (76.567 kg).  Oropharynx is without thrush. No palpable cervical, supraclavicular or axillary lymph nodes. Status post left mastectomy with implant in place. No evidence for chest wall tumor recurrence. No mass palpated in the right breast. Lungs clear. Regular cardiac rhythm. Abdomen is soft. No hepatomegaly. Liver is palpable throughout the left abdomen extending to one to 2 finger breaths below the umbilicus. Extremities are without edema.  Lab Results: Lab Results  Component Value Date   WBC 8.2 10/19/2011   HGB 11.1* 10/19/2011   HCT 32.5* 10/19/2011   MCV 84.7 10/19/2011   PLT 179 10/19/2011    Chemistry:    Chemistry      Component Value Date/Time   NA 141 11/23/2007 0935   K 3.9 11/23/2007 0935   CL 104 11/23/2007 0935   CO2 25 11/23/2007 0935   BUN 8 11/23/2007 0935   CREATININE 0.61 11/23/2007 0935      Component Value Date/Time   CALCIUM 9.3 11/23/2007 0935   ALKPHOS 50 11/23/2007 0935   AST 16 11/23/2007 0935   ALT 12 11/23/2007 0935   BILITOT 0.6 11/23/2007 0935       Studies/Results: No results found.  Medications: I have reviewed the patient's current medications.  Assessment/Plan:  1. Stage II left-sided breast cancer diagnosed in February 2000. Screening mammogram 10/05/2011 with suggestion of a subcentimeter oval mass on  the left superiorly and no suspicious masses, calcifications or architectural distortion in the right breast. She underwent biopsy of the "asymmetric density of concern in the superior aspect of the right breast" on 10/13/2011. Pathology showed a fibroadenoma. We will contact the mammogram facility for clarification on left versus right. 2. 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 She will return to the lab for a CBC today. 3. 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 4. Intermittent bone pain relieved with ibuprofen, likely a rheumatic manifestation of myelofibrosis. 5. History of intermittent low-grade fever and "night sweats," likely systemic manifestations of myelofibrosis.  Disposition-[name] appears stable. She will return to the lab today for a CBC. We will contact the mammogram facility for clarification on the mammogram report 10/05/2011 as noted above. She will return for a lab visit in 3 months and a followup visit in 6 months. She will contact the office in the interim with any problems.  Plan reviewed with Dr. Truett Perna.  Lonna Cobb ANP/GNP-BC

## 2012-03-23 ENCOUNTER — Other Ambulatory Visit: Payer: PRIVATE HEALTH INSURANCE | Admitting: Lab

## 2012-03-23 DIAGNOSIS — D7581 Myelofibrosis: Secondary | ICD-10-CM

## 2012-03-23 LAB — MANUAL DIFFERENTIAL
Basophil: 0 % (ref 0–2)
EOS: 0 % (ref 0–7)
Metamyelocytes: 6 % — ABNORMAL HIGH (ref 0–0)
Myelocytes: 3 % — ABNORMAL HIGH (ref 0–0)
Other Cell: 0 % (ref 0–0)
PLT EST: ADEQUATE
PROMYELO: 0 % (ref 0–0)
SEG: 44 % (ref 38–77)

## 2012-03-23 LAB — CBC WITH DIFFERENTIAL/PLATELET
MCHC: 32.8 g/dL (ref 31.5–36.0)
MCV: 84.6 fL (ref 79.5–101.0)
RBC: 3.89 10*6/uL (ref 3.70–5.45)
RDW: 17.4 % — ABNORMAL HIGH (ref 11.2–14.5)
WBC: 8.2 10*3/uL (ref 3.9–10.3)

## 2012-03-28 ENCOUNTER — Telehealth: Payer: Self-pay | Admitting: *Deleted

## 2012-03-28 NOTE — Telephone Encounter (Signed)
Notified of CBC results--stable. F/U as scheduled.

## 2012-05-11 ENCOUNTER — Other Ambulatory Visit: Payer: Self-pay | Admitting: Oncology

## 2012-05-11 DIAGNOSIS — D7581 Myelofibrosis: Secondary | ICD-10-CM

## 2012-06-24 ENCOUNTER — Other Ambulatory Visit: Payer: PRIVATE HEALTH INSURANCE | Admitting: Lab

## 2012-09-19 ENCOUNTER — Telehealth: Payer: Self-pay | Admitting: Oncology

## 2012-09-19 ENCOUNTER — Other Ambulatory Visit (HOSPITAL_BASED_OUTPATIENT_CLINIC_OR_DEPARTMENT_OTHER): Payer: PRIVATE HEALTH INSURANCE | Admitting: Lab

## 2012-09-19 ENCOUNTER — Ambulatory Visit (HOSPITAL_BASED_OUTPATIENT_CLINIC_OR_DEPARTMENT_OTHER): Payer: PRIVATE HEALTH INSURANCE | Admitting: Oncology

## 2012-09-19 VITALS — BP 122/75 | HR 80 | Temp 97.5°F | Resp 20 | Ht 67.0 in | Wt 166.7 lb

## 2012-09-19 DIAGNOSIS — D7581 Myelofibrosis: Secondary | ICD-10-CM

## 2012-09-19 DIAGNOSIS — M949 Disorder of cartilage, unspecified: Secondary | ICD-10-CM

## 2012-09-19 DIAGNOSIS — D649 Anemia, unspecified: Secondary | ICD-10-CM

## 2012-09-19 DIAGNOSIS — Z853 Personal history of malignant neoplasm of breast: Secondary | ICD-10-CM

## 2012-09-19 LAB — MANUAL DIFFERENTIAL
Basophil: 1 % (ref 0–2)
EOS: 1 % (ref 0–7)
MONO: 5 % (ref 0–14)
Metamyelocytes: 5 % — ABNORMAL HIGH (ref 0–0)
Myelocytes: 2 % — ABNORMAL HIGH (ref 0–0)

## 2012-09-19 LAB — CBC WITH DIFFERENTIAL/PLATELET
MCH: 28.8 pg (ref 25.1–34.0)
MCHC: 33.9 g/dL (ref 31.5–36.0)
MCV: 85.1 fL (ref 79.5–101.0)
Platelets: 157 10*3/uL (ref 145–400)
RBC: 3.96 10*6/uL (ref 3.70–5.45)

## 2012-09-19 NOTE — Progress Notes (Signed)
   [facility]    OFFICE PROGRESS NOTE   INTERVAL HISTORY:   She returns as scheduled. She continues to have diffuse "aching ". This is relieved with 2 ibuprofen tablets daily. Occasional night sweats. No fever, nausea, or abdominal pain. No change at the left chest wall.  Objective:  Vital signs in last 24 hours:  Blood pressure 122/75, pulse 80, temperature 97.5 F (36.4 C), temperature source Oral, resp. rate 20, height 5\' 7"  (1.702 m), weight 166 lb 11.2 oz (75.615 kg).    HEENT: Neck without mass Lymphatics: No cervical, supraclavicular, or axillary nodes Resp: Lungs clear bilaterally Cardio: Regular rate and rhythm GI: No hepatomegaly. The spleen is palpable throughout the left abdomen and extends to below the umbilicus. Vascular: No leg edema   Lab Results:  Lab Results  Component Value Date   WBC 8.3 09/19/2012   HGB 11.4* 09/19/2012   HCT 33.7* 09/19/2012   MCV 85.1 09/19/2012   PLT 157 09/19/2012   ANC 6.1   Medications: I have reviewed the patient's current medications.  Assessment/Plan: 1. Stage II left-sided breast cancer diagnosed in February 2000. Screening mammogram 10/05/2011 with suggestion of a subcentimeter oval mass on the left superiorly and no suspicious masses, calcifications or architectural distortion in the right breast. She underwent biopsy of the "asymmetric density of concern in the superior aspect of the right breast" on 10/13/2011. Pathology showed a fibroadenoma. We will contact the mammogram facility for clarification on left versus right. 2. 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 Stable. 3. 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 4. Intermittent bone pain relieved with ibuprofen, likely a rheumatic manifestation of myelofibrosis. 5. History of intermittent low-grade fever and "night sweats," likely systemic manifestations of myelofibrosis.   Disposition:  She is stable from a  hematologic standpoint. We again discussed the indication for [facility] therapy. She would like to hold on this for now. Ms. Marlett will return for an office visit and CBC in 6 months she will contact us in the interim for new symptoms.  She will schedule a right mammogram for November of 2013.   Thornton Papas, MD  09/19/2012  10:33 AM

## 2012-09-19 NOTE — Telephone Encounter (Signed)
Printed and gv pt appt schedule for April 2014 °

## 2012-10-28 ENCOUNTER — Other Ambulatory Visit: Payer: Self-pay | Admitting: Oncology

## 2012-11-07 ENCOUNTER — Emergency Department (HOSPITAL_BASED_OUTPATIENT_CLINIC_OR_DEPARTMENT_OTHER)
Admission: EM | Admit: 2012-11-07 | Discharge: 2012-11-08 | Disposition: A | Discharge status: Home / Self Care | Payer: No Typology Code available for payment source | Attending: Emergency Medicine | Admitting: Emergency Medicine

## 2012-11-07 ENCOUNTER — Emergency Department (HOSPITAL_BASED_OUTPATIENT_CLINIC_OR_DEPARTMENT_OTHER): Payer: No Typology Code available for payment source

## 2012-11-07 ENCOUNTER — Encounter (HOSPITAL_BASED_OUTPATIENT_CLINIC_OR_DEPARTMENT_OTHER): Payer: Self-pay | Admitting: *Deleted

## 2012-11-07 DIAGNOSIS — Z853 Personal history of malignant neoplasm of breast: Secondary | ICD-10-CM | POA: Insufficient documentation

## 2012-11-07 DIAGNOSIS — F411 Generalized anxiety disorder: Secondary | ICD-10-CM | POA: Insufficient documentation

## 2012-11-07 DIAGNOSIS — R0789 Other chest pain: Secondary | ICD-10-CM | POA: Insufficient documentation

## 2012-11-07 DIAGNOSIS — R161 Splenomegaly, not elsewhere classified: Secondary | ICD-10-CM | POA: Insufficient documentation

## 2012-11-07 DIAGNOSIS — R51 Headache: Secondary | ICD-10-CM | POA: Insufficient documentation

## 2012-11-07 DIAGNOSIS — R109 Unspecified abdominal pain: Secondary | ICD-10-CM | POA: Insufficient documentation

## 2012-11-07 DIAGNOSIS — D7581 Myelofibrosis: Secondary | ICD-10-CM | POA: Insufficient documentation

## 2012-11-07 HISTORY — DX: Malignant neoplasm of unspecified site of unspecified female breast: C50.919

## 2012-11-07 HISTORY — DX: Myelofibrosis: D75.81

## 2012-11-07 LAB — COMPREHENSIVE METABOLIC PANEL WITH GFR
ALT: 25 U/L (ref 0–35)
AST: 23 U/L (ref 0–37)
CO2: 27 meq/L (ref 19–32)
Calcium: 9.4 mg/dL (ref 8.4–10.5)
GFR calc non Af Amer: >90 mL/min (ref >90)
Potassium: 3.9 meq/L (ref 3.5–5.1)
Sodium: 139 meq/L (ref 135–145)
Total Protein: 7.5 g/dL (ref 6.0–8.3)

## 2012-11-07 LAB — CBC WITH DIFFERENTIAL/PLATELET
Basophils Absolute: 0.1 K/uL (ref 0.0–0.1)
Basophils Relative: 1 % (ref 0–1)
Eosinophils Absolute: 0.1 10*3/uL (ref 0.0–0.7)
Eosinophils Relative: 1 % (ref 0–5)
HCT: 33.9 % — ABNORMAL LOW (ref 36.0–46.0)
Hemoglobin: 11.3 g/dL — ABNORMAL LOW (ref 12.0–15.0)
Lymphocytes Relative: 16 % (ref 12–46)
Lymphs Abs: 1.9 10*3/uL (ref 0.7–4.0)
MCH: 28.6 pg (ref 26.0–34.0)
MCHC: 33.3 g/dL (ref 30.0–36.0)
MCV: 85.8 fL (ref 78.0–100.0)
Monocytes Absolute: 1.1 10*3/uL — ABNORMAL HIGH (ref 0.1–1.0)
Monocytes Relative: 9 % (ref 3–12)
Neutro Abs: 8.5 K/uL — ABNORMAL HIGH (ref 1.7–7.7)
Neutrophils Relative %: 73 % (ref 43–77)
Platelets: 176 10*3/uL (ref 150–400)
RBC: 3.95 MIL/uL (ref 3.87–5.11)
RDW: 17.1 % — ABNORMAL HIGH (ref 11.5–15.5)
WBC: 11.7 K/uL — ABNORMAL HIGH (ref 4.0–10.5)

## 2012-11-07 LAB — COMPREHENSIVE METABOLIC PANEL
Albumin: 4.1 g/dL (ref 3.5–5.2)
Alkaline Phosphatase: 62 U/L (ref 39–117)
BUN: 9 mg/dL (ref 6–23)
Chloride: 103 mEq/L (ref 96–112)
Creatinine, Ser: 0.7 mg/dL (ref 0.50–1.10)
GFR calc Af Amer: >90 mL/min (ref >90)
Glucose, Bld: 104 mg/dL — ABNORMAL HIGH (ref 70–99)
Total Bilirubin: 0.5 mg/dL (ref 0.3–1.2)

## 2012-11-07 LAB — TROPONIN I
Troponin I: <0.3 ng/mL (ref <0.30)
Troponin I: <0.3 ng/mL (ref <0.30)

## 2012-11-07 MED ORDER — IBUPROFEN 800 MG PO TABS
800.0000 mg | ORAL_TABLET | Freq: Once | ORAL | Status: AC
  Administered 2012-11-07: 800 mg via ORAL
  Filled 2012-11-07: qty 1

## 2012-11-07 NOTE — ED Notes (Signed)
Pt requested med for HA-advised unable to give any meds until seen by EDP

## 2012-11-07 NOTE — ED Notes (Signed)
MD at bedside. 

## 2012-11-07 NOTE — ED Notes (Signed)
Pt c/o chest pain x 1 hr with SOB .

## 2012-11-07 NOTE — ED Provider Notes (Signed)
History   This chart was scribed for Olivia Mackie, MD, by Frederik Pear, ER scribe. The patient was seen in room MH09/MH09 and the patient's care was started at 2300.    CSN: [id number]  Arrival date & time 11/07/12  2122   First MD Initiated Contact with Patient 11/07/12 2300      Chief Complaint  Patient presents with  . Chest Pain    (Consider location/radiation/quality/duration/timing/severity/associated sxs/prior treatment) HPI Comments: [name] is a 46 y.o. female who presents to the Emergency Department complaining of moderate, constant central chest pain with associated abdominal pain that began at 20:00 and lasted for 20 minutes. She reports a moderate headache that has persisted all day. She treated herself with 2 xanax PTA because she thought the pain was anxiety related. She denies any diaphoresis, swelling in her legs, and pain in her calves. She has a h/o of myelofibrosis, anxiety, and breast cancer in 2000. She reports that her mylofibrosis flares when she gets stressed out, and she states that she She denies any h/o of panic attacks. Her husband reports that she has been experiencing nocturnal hyperhidrosis for the past 7-8 months. She denies any issues with cancer since then. She is a nonsmoker and denies ETOH use. She denies that she is in menopause.    PCP is Dr. Amador Cunas. OBGYN is Dr. Jennette Kettle Oncologist is Dr. Truett Perna      Past Medical History  Diagnosis Date  . Breast cancer   . Myelofibrosis     Past Surgical History  Procedure Date  . Breast surgery   . Abdominal hysterectomy     History reviewed. No pertinent family history.  History  Substance Use Topics  . Smoking status: Never Smoker   . Smokeless tobacco: Not on file  . Alcohol Use: No    OB History    Grav Para Term Preterm Abortions TAB SAB Ect Mult Living                  Review of Systems  Gastrointestinal: Positive for abdominal pain.  Neurological: Positive for  headaches.  All other systems reviewed and are negative.    Allergies  Clarithromycin  Home Medications   Current Outpatient Rx  Name  Route  Sig  Dispense  Refill  . ALPRAZOLAM 2 MG PO TABS   Oral   Take 2 mg by mouth at bedtime as needed.         . IBUPROFEN 800 MG PO TABS      TAKE 1 TABLET BY MOUTH TWICE A DAY WITH FOOD AS NEEDED FOR PAIN   180 tablet   1     BP 154/89  Pulse 102  Temp 98.4 F (36.9 C) (Oral)  Resp 16  Ht 5\' 9"  (1.753 m)  Wt 165 lb (74.844 kg)  BMI 24.37 kg/m2  SpO2 100%  Physical Exam  Nursing note and vitals reviewed. Constitutional: She is oriented to person, place, and time. She appears well-developed and well-nourished. No distress.  HENT:  Head: Normocephalic and atraumatic.  Eyes: EOM are normal. Pupils are equal, round, and reactive to light.  Neck: Normal range of motion. Neck supple. No tracheal deviation present.  Cardiovascular: Normal rate.   Pulmonary/Chest: Effort normal. No respiratory distress.  Abdominal: Soft. She exhibits no distension. There is splenomegaly.  Musculoskeletal: Normal range of motion. She exhibits no edema.  Neurological: She is alert and oriented to person, place, and time.  Skin: Skin is warm and  dry.  Psychiatric: She has a normal mood and affect. Her behavior is normal.    ED Course  Procedures (including critical care time)  DIAGNOSTIC STUDIES: Oxygen Saturation is 100% on room air, normal by my interpretation.    COORDINATION OF CARE:  23:10- Discussed planned course of treatment with the patient, including blood work and troponin, who is agreeable at this time.  Labs Reviewed  CBC WITH DIFFERENTIAL - Abnormal; Notable for the following:    WBC 11.7 (*)     Hemoglobin 11.3 (*)     HCT 33.9 (*)     RDW 17.1 (*)     Neutro Abs 8.5 (*)     Monocytes Absolute 1.1 (*)     All other components within normal limits  COMPREHENSIVE METABOLIC PANEL - Abnormal; Notable for the following:     Glucose, Bld 104 (*)     All other components within normal limits  TROPONIN I   Dg Chest 2 View  11/07/2012  *RADIOLOGY REPORT*  Clinical Data: Chest pain  CHEST - 2 VIEW  Comparison: 10/09/2008  Findings: Heart size and mediastinal contours within normal range. Surgical clips left axilla.  No pleural effusion or pneumothorax. No confluent airspace opacity.  No interval osseous change.  IMPRESSION: No radiographic evidence of acute cardiopulmonary process.   Original Report Authenticated By: Jearld Lesch, M.D.      1. Atypical chest pain   2. Headache       MDM  30 y o female with 20 minutes of chest pressure, sob that resolved with xanax.  No specific risks for CAD -no htn, dm, family history, smoking.  Do not feel sxs are due to PE.  Two sets of cardiac markers negative.  Pt has good f/u with pcm.  I personally performed the services described in this documentation, which was scribed in my presence. The recorded information has been reviewed and is accurate.         Olivia Mackie, MD 11/08/12 [phone]

## 2012-11-08 LAB — TROPONIN I: Troponin I: <0.3 ng/mL (ref <0.30)

## 2013-01-07 ENCOUNTER — Other Ambulatory Visit: Payer: Self-pay

## 2013-03-06 ENCOUNTER — Telehealth: Payer: Self-pay | Admitting: Oncology

## 2013-03-06 NOTE — Telephone Encounter (Signed)
s.w. pt and advised on appt moved to 5.9.14 due to Dr. Truett Perna being out of the office....pt ok and aware

## 2013-03-17 ENCOUNTER — Other Ambulatory Visit: Payer: PRIVATE HEALTH INSURANCE | Admitting: Lab

## 2013-03-17 ENCOUNTER — Ambulatory Visit: Payer: PRIVATE HEALTH INSURANCE | Admitting: Oncology

## 2013-03-31 ENCOUNTER — Telehealth: Payer: Self-pay | Admitting: Oncology

## 2013-03-31 ENCOUNTER — Other Ambulatory Visit (HOSPITAL_BASED_OUTPATIENT_CLINIC_OR_DEPARTMENT_OTHER): Payer: PRIVATE HEALTH INSURANCE | Admitting: Lab

## 2013-03-31 ENCOUNTER — Ambulatory Visit (HOSPITAL_BASED_OUTPATIENT_CLINIC_OR_DEPARTMENT_OTHER): Payer: PRIVATE HEALTH INSURANCE | Admitting: Oncology

## 2013-03-31 VITALS — BP 137/77 | HR 71 | Temp 96.8°F | Resp 18 | Ht 69.0 in | Wt 164.7 lb

## 2013-03-31 DIAGNOSIS — D649 Anemia, unspecified: Secondary | ICD-10-CM

## 2013-03-31 DIAGNOSIS — Z853 Personal history of malignant neoplasm of breast: Secondary | ICD-10-CM

## 2013-03-31 DIAGNOSIS — D7581 Myelofibrosis: Secondary | ICD-10-CM

## 2013-03-31 DIAGNOSIS — M899 Disorder of bone, unspecified: Secondary | ICD-10-CM

## 2013-03-31 LAB — CBC WITH DIFFERENTIAL/PLATELET
HCT: 30.1 % — ABNORMAL LOW (ref 34.8–46.6)
MCH: 28.1 pg (ref 25.1–34.0)
MCHC: 33.7 g/dL (ref 31.5–36.0)
RBC: 3.61 10*6/uL — ABNORMAL LOW (ref 3.70–5.45)
RDW: 17 % — ABNORMAL HIGH (ref 11.2–14.5)

## 2013-03-31 LAB — MANUAL DIFFERENTIAL
ALC: 1.3 10*3/uL (ref 0.9–3.3)
ANC (CHCC manual diff): 5.2 10*3/uL (ref 1.5–6.5)
Basophil: 0 % (ref 0–2)
Blasts: 1 % — ABNORMAL HIGH (ref 0–0)
LYMPH: 20 % (ref 14–49)
Metamyelocytes: 7 % — ABNORMAL HIGH (ref 0–0)
Myelocytes: 2 % — ABNORMAL HIGH (ref 0–0)
PLT EST: ADEQUATE
PROMYELO: 0 % (ref 0–0)
Variant Lymph: 0 % (ref 0–0)

## 2013-03-31 NOTE — Telephone Encounter (Signed)
gv and printed appt sched and avs for pt for Aug °

## 2013-03-31 NOTE — Progress Notes (Signed)
OFFICE PROGRESS NOTE  Interval history:  [name] returns as scheduled. Overall she feels well. She continues to note diffuse "aching" discomfort periodically. She takes ibuprofen with relief. She has a good appetite. She denies abdominal pain. No nausea or vomiting. Bowels moving regularly. No changes over the chest wall. She denies any fevers. No sweats.   Objective: Blood pressure 137/77, pulse 71, temperature 96.8 F (36 C), temperature source Oral, resp. rate 18, height 5\' 9"  (1.753 m), weight 164 lb 11.2 oz (74.707 kg).  And thrush or ulceration. No palpable cervical, supraclavicular or axillary lymph nodes. Lungs are clear. Regular cardiac rhythm. No hepatomegaly. The spleen is palpable throughout the left upper abdomen extending to just below the level of the umbilicus and to midline. Extremities without edema. No mass palpated right breast. Left breast implant.  Lab Results: Lab Results  Component Value Date   WBC 6.7 03/31/2013   HGB 10.1* 03/31/2013   HCT 30.1* 03/31/2013   MCV 83.4 03/31/2013   PLT 157 03/31/2013    Chemistry:    Chemistry      Component Value Date/Time   NA 139 11/07/2012 2150   K 3.9 11/07/2012 2150   CL 103 11/07/2012 2150   CO2 27 11/07/2012 2150   BUN 9 11/07/2012 2150   CREATININE 0.70 11/07/2012 2150      Component Value Date/Time   CALCIUM 9.4 11/07/2012 2150   ALKPHOS 62 11/07/2012 2150   AST 23 11/07/2012 2150   ALT 25 11/07/2012 2150   BILITOT 0.5 11/07/2012 2150       Studies/Results: No results found.  Medications: I have reviewed the patient's current medications.  Assessment/Plan:  1. Stage II left-sided breast cancer diagnosed in February 2000. Screening mammogram 10/05/2011 with suggestion of a subcentimeter oval mass on the left superiorly and no suspicious masses, calcifications or architectural distortion in the right breast. She underwent biopsy of the "asymmetric density of concern in the superior aspect of the right  breast" on 10/13/2011. Pathology showed a fibroadenoma.  2. 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  3. 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 4. Intermittent bone pain relieved with ibuprofen, likely a rheumatic manifestation of myelofibrosis. 5. History of intermittent low-grade fever and "night sweats," likely systemic manifestations of myelofibrosis.  Disposition-Ms. Giddings appears stable. Her hemoglobin is down slightly from baseline. We will have her return in 3 months to repeat a CBC.  She is due for a mammogram. She plans to schedule it in the near future.  We will see her in followup in 3 months. She will contact the office in the interim with any problems.  Patient seen with Dr. Truett Perna.  [name] ANP/GNP-BC

## 2013-04-06 ENCOUNTER — Other Ambulatory Visit: Payer: Self-pay | Admitting: Oncology

## 2013-04-06 DIAGNOSIS — D7581 Myelofibrosis: Secondary | ICD-10-CM

## 2013-06-26 ENCOUNTER — Telehealth: Payer: Self-pay | Admitting: Oncology

## 2013-06-26 ENCOUNTER — Ambulatory Visit (HOSPITAL_BASED_OUTPATIENT_CLINIC_OR_DEPARTMENT_OTHER): Payer: PRIVATE HEALTH INSURANCE | Admitting: Oncology

## 2013-06-26 ENCOUNTER — Other Ambulatory Visit (HOSPITAL_BASED_OUTPATIENT_CLINIC_OR_DEPARTMENT_OTHER): Payer: PRIVATE HEALTH INSURANCE | Admitting: Lab

## 2013-06-26 VITALS — BP 138/83 | HR 73 | Temp 97.9°F | Resp 18 | Ht 69.0 in | Wt 162.9 lb

## 2013-06-26 DIAGNOSIS — R040 Epistaxis: Secondary | ICD-10-CM

## 2013-06-26 DIAGNOSIS — N959 Unspecified menopausal and perimenopausal disorder: Secondary | ICD-10-CM

## 2013-06-26 DIAGNOSIS — D7581 Myelofibrosis: Secondary | ICD-10-CM

## 2013-06-26 DIAGNOSIS — R161 Splenomegaly, not elsewhere classified: Secondary | ICD-10-CM

## 2013-06-26 DIAGNOSIS — M949 Disorder of cartilage, unspecified: Secondary | ICD-10-CM

## 2013-06-26 LAB — MANUAL DIFFERENTIAL
ALC: 0.9 10*3/uL (ref 0.9–3.3)
ANC (CHCC manual diff): 4.3 10*3/uL (ref 1.5–6.5)
Blasts: 1 % — ABNORMAL HIGH (ref 0–0)
LYMPH: 16 % (ref 14–49)
MONO: 2 % (ref 0–14)
Metamyelocytes: 13 % — ABNORMAL HIGH (ref 0–0)
Other Cell: 0 % (ref 0–0)
SEG: 45 % (ref 38–77)
Variant Lymph: 0 % (ref 0–0)

## 2013-06-26 LAB — CBC WITH DIFFERENTIAL/PLATELET
HCT: 31 % — ABNORMAL LOW (ref 34.8–46.6)
HGB: 10.5 g/dL — ABNORMAL LOW (ref 11.6–15.9)
MCHC: 33.9 g/dL (ref 31.5–36.0)
Platelets: 151 10*3/uL (ref 145–400)
WBC: 5.4 10*3/uL (ref 3.9–10.3)

## 2013-06-26 NOTE — Progress Notes (Signed)
   [facility]    OFFICE PROGRESS NOTE   INTERVAL HISTORY:   She returns as scheduled. She has developed increased night sweats over the past month. She reports having sweats 3-4 nights per week. No fever or anorexia. No abdominal pain. She has a nosebleed every 3-4 weeks. The bleeding is from the right nostril. No other bleeding."aching "is relieved with twice daily ibuprofen.  Objective:  Vital signs in last 24 hours:  Blood pressure 138/83, pulse 73, temperature 97.9 F (36.6 C), temperature source Oral, resp. rate 18, height 5\' 9"  (1.753 m), weight 162 lb 14.4 oz (73.891 kg). Resp: lungs clear bilaterally Cardio: regular rate and rhythm GI: the spleen is palpable in the left abdomen extending below the umbilicus and to the right of the midline, no hepatomegaly Vascular: no leg edema   Lab Results:  Lab Results  Component Value Date   WBC 5.4 06/26/2013   HGB 10.5* 06/26/2013   HCT 31.0* 06/26/2013   MCV 85.2 06/26/2013   PLT 151 06/26/2013      Medications: I have reviewed the patient's current medications.  Assessment/Plan: 1. Stage II left-sided breast cancer diagnosed in February 2000. Screening mammogram 10/05/2011 with suggestion of a subcentimeter oval mass on the left superiorly and no suspicious masses, calcifications or architectural distortion in the right breast. She underwent biopsy of the "asymmetric density of concern in the superior aspect of the right breast" on 10/13/2011. Pathology showed a fibroadenoma.  2. 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  3. 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 4. Intermittent bone pain relieved with ibuprofen, likely a rheumatic manifestation of myelofibrosis. 5. History of intermittent low-grade fever and "night sweats," likely systemic manifestations of myelofibrosis.the night sweats have increased. 6. Nose bleeding-? Related to platelet dysfunction complicated by ibuprofen  use   Disposition:  She is stable from a hematologic standpoint. The increased night sweats are likely related to myelofibrosis. She does not wish to begin systemic therapy. She will discontinue ibuprofen and try twice daily naproxen for relief of the night sweats/bone pain.  She will contact us if her symptoms do not improve. We will make an ENT referral if she has continued nose bleeding.  Ms. Feltes will return for an office visit in 3 months. We will check an FSH and estradiol level when she returns in 3 months.   Thornton Papas, MD  06/26/2013  8:16 PM

## 2013-06-26 NOTE — Telephone Encounter (Signed)
gv and printed appt sched and avs of rpt. °

## 2013-09-28 ENCOUNTER — Other Ambulatory Visit: Payer: Self-pay

## 2013-10-02 ENCOUNTER — Ambulatory Visit: Payer: PRIVATE HEALTH INSURANCE | Admitting: Internal Medicine

## 2013-10-02 ENCOUNTER — Other Ambulatory Visit: Payer: PRIVATE HEALTH INSURANCE | Admitting: Lab

## 2013-10-02 ENCOUNTER — Other Ambulatory Visit: Payer: PRIVATE HEALTH INSURANCE

## 2013-10-04 ENCOUNTER — Telehealth: Payer: Self-pay | Admitting: Oncology

## 2013-10-04 NOTE — Telephone Encounter (Signed)
returned call to pt who had left vm stating wanted to rs 11/17 appts RS to 12/04/2013 sw pt and mailed cal shh

## 2013-10-09 ENCOUNTER — Other Ambulatory Visit: Payer: PRIVATE HEALTH INSURANCE | Admitting: Lab

## 2013-10-09 ENCOUNTER — Ambulatory Visit: Payer: PRIVATE HEALTH INSURANCE | Admitting: Oncology

## 2013-10-23 ENCOUNTER — Other Ambulatory Visit: Payer: Self-pay | Admitting: *Deleted

## 2013-10-23 DIAGNOSIS — D7581 Myelofibrosis: Secondary | ICD-10-CM

## 2013-10-23 MED ORDER — IBUPROFEN 800 MG PO TABS: ORAL_TABLET | ORAL | Status: DC

## 2013-12-04 ENCOUNTER — Telehealth: Payer: Self-pay | Admitting: Oncology

## 2013-12-04 ENCOUNTER — Other Ambulatory Visit (HOSPITAL_BASED_OUTPATIENT_CLINIC_OR_DEPARTMENT_OTHER): Payer: PRIVATE HEALTH INSURANCE

## 2013-12-04 ENCOUNTER — Ambulatory Visit (HOSPITAL_BASED_OUTPATIENT_CLINIC_OR_DEPARTMENT_OTHER): Payer: PRIVATE HEALTH INSURANCE | Admitting: Oncology

## 2013-12-04 VITALS — BP 149/79 | HR 72 | Temp 98.3°F | Resp 20 | Ht 69.0 in | Wt 166.0 lb

## 2013-12-04 DIAGNOSIS — D7581 Myelofibrosis: Secondary | ICD-10-CM

## 2013-12-04 DIAGNOSIS — M949 Disorder of cartilage, unspecified: Secondary | ICD-10-CM

## 2013-12-04 DIAGNOSIS — R161 Splenomegaly, not elsewhere classified: Secondary | ICD-10-CM

## 2013-12-04 DIAGNOSIS — Z853 Personal history of malignant neoplasm of breast: Secondary | ICD-10-CM

## 2013-12-04 DIAGNOSIS — M899 Disorder of bone, unspecified: Secondary | ICD-10-CM

## 2013-12-04 LAB — CBC WITH DIFFERENTIAL/PLATELET
HCT: 35.7 % (ref 34.8–46.6)
HEMOGLOBIN: 11.7 g/dL (ref 11.6–15.9)
MCH: 28.2 pg (ref 25.1–34.0)
MCHC: 32.8 g/dL (ref 31.5–36.0)
MCV: 86 fL (ref 79.5–101.0)
Platelets: 191 10*3/uL (ref 145–400)
RBC: 4.15 10*6/uL (ref 3.70–5.45)
RDW: 16.8 % — AB (ref 11.2–14.5)
WBC: 7.1 10*3/uL (ref 3.9–10.3)

## 2013-12-04 LAB — COMPREHENSIVE METABOLIC PANEL (CC13)
ALK PHOS: 59 U/L (ref 40–150)
ALT: 22 U/L (ref 0–55)
AST: 20 U/L (ref 5–34)
Albumin: 4.2 g/dL (ref 3.5–5.0)
Anion Gap: 10 mEq/L (ref 3–11)
BILIRUBIN TOTAL: 0.66 mg/dL (ref 0.20–1.20)
BUN: 11.8 mg/dL (ref 7.0–26.0)
CO2: 24 mEq/L (ref 22–29)
CREATININE: 0.8 mg/dL (ref 0.6–1.1)
Calcium: 9.4 mg/dL (ref 8.4–10.4)
Chloride: 105 mEq/L (ref 98–109)
Glucose: 75 mg/dl (ref 70–140)
POTASSIUM: 4.3 meq/L (ref 3.5–5.1)
Sodium: 139 mEq/L (ref 136–145)
Total Protein: 7 g/dL (ref 6.4–8.3)

## 2013-12-04 LAB — MANUAL DIFFERENTIAL
ALC: 0.9 10*3/uL (ref 0.9–3.3)
ANC (CHCC MAN DIFF): 5.6 10*3/uL (ref 1.5–6.5)
BAND NEUTROPHILS: 12 % — AB (ref 0–10)
BLASTS: 2 % — AB (ref 0–0)
Basophil: 1 % (ref 0–2)
EOS: 1 % (ref 0–7)
LYMPH: 13 % — AB (ref 14–49)
MONO: 4 % (ref 0–14)
Metamyelocytes: 4 % — ABNORMAL HIGH (ref 0–0)
Myelocytes: 4 % — ABNORMAL HIGH (ref 0–0)
NRBC: 3 % — AB (ref 0–0)
Other Cell: 0 % (ref 0–0)
PLT EST: ADEQUATE
PROMYELO: 0 % (ref 0–0)
SEG: 59 % (ref 38–77)
VARIANT LYMPH: 0 % (ref 0–0)

## 2013-12-04 LAB — LACTATE DEHYDROGENASE (CC13): LDH: 756 U/L — AB (ref 125–245)

## 2013-12-04 LAB — FOLLICLE STIMULATING HORMONE: FSH: 77.8 m[IU]/mL

## 2013-12-04 NOTE — Progress Notes (Signed)
   [place]    OFFICE PROGRESS NOTE   INTERVAL HISTORY:   She returns for scheduled follow up of myelofibrosis. She continues to have intermittent night sweats and bone pain. The bone pain is relieved with twice daily ibuprofen. She notes splenomegaly, but this is not bothersome. She reports having a mammogram in November.  She requests we consider BRCA testing sense several additional family members have a history of breast cancer. A maternal aunt, maternal great, and maternal cousin have a history of breast cancer. No family history of ovarian or uterine cancer.  Objective:  Vital signs in last 24 hours:  Blood pressure 149/79, pulse 72, temperature 98.3 F (36.8 C), temperature source Oral, resp. rate 20, height _0  (1.753 m), weight 166 lb (75.297 kg).    Lymphatics: No cervical, supraclavicular, or axillary nodes Resp: Lungs clear bilaterally Cardio: Regular rate and rhythm GI: The spleen is palpable in the left lower abdomen. No hepatomegaly Vascular: No leg edema    Lab Results:  Lab Results  Component Value Date   WBC 7.1 12/04/2013   HGB 11.7 12/04/2013   HCT 35.7 12/04/2013   MCV 86.0 12/04/2013   PLT 191 12/04/2013   NEUTROABS 8.5* 11/07/2012      Medications: I have reviewed the patient's current medications.  Assessment/Plan: 1. Stage II left-sided breast cancer diagnosed in February 2000. Screening mammogram 10/05/2011 with suggestion of a subcentimeter oval mass on the left superiorly and no suspicious masses, calcifications or architectural distortion in the right breast. She underwent biopsy of the "asymmetric density of concern in the superior aspect of the right breast" on 10/13/2011. Pathology showed a fibroadenoma.  2. 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  3. 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 4. Intermittent bone pain relieved with ibuprofen, likely a rheumatic manifestation of  myelofibrosis. 5. History of intermittent low-grade fever and "night sweats," likely systemic manifestations of myelofibrosis. 6. History of Nose bleeding-she did not report nose bleeding today 7. Personal and family history breast cancer-we made a referral to the genetics screening clinic to consider the indication for genetic test  Disposition:  She is stable from a hematologic standpoint. She does not wish to begin systemic therapy for the myelofibrosis at present. She will continue ibuprofen as needed for the bone pain and night sweats. Ms. Show will return for an office visit and CBC in 4 months. we made a referral to the genetic screening clinic.   Betsy Coder, MD  12/04/2013  11:13 AM

## 2013-12-04 NOTE — Telephone Encounter (Signed)
gave pt appt for lab,md and genetics for march and May 2015

## 2013-12-13 LAB — ESTRADIOL, ULTRA SENS: ESTRADIOL, ULTRA SENSITIVE: 5 pg/mL

## 2013-12-14 ENCOUNTER — Telehealth: Payer: Self-pay | Admitting: *Deleted

## 2013-12-14 NOTE — Telephone Encounter (Signed)
Called pt with hormone test results: Consistent with menopause, per Dr. Benay Spice. Pt is asking if there is anything she can take for the night sweats? Per Dr. Benay Spice: See GYN to discuss hormones. Treatment of myelofibrosis may help the night sweats. Pt voiced understanding. She will contact GYN.

## 2014-01-02 ENCOUNTER — Other Ambulatory Visit: Payer: Self-pay | Admitting: Oncology

## 2014-01-02 DIAGNOSIS — D7581 Myelofibrosis: Secondary | ICD-10-CM

## 2014-02-15 ENCOUNTER — Other Ambulatory Visit: Payer: 59

## 2014-02-15 ENCOUNTER — Encounter: Payer: Self-pay | Admitting: Genetic Counselor

## 2014-02-15 ENCOUNTER — Ambulatory Visit (HOSPITAL_BASED_OUTPATIENT_CLINIC_OR_DEPARTMENT_OTHER): Payer: PRIVATE HEALTH INSURANCE | Admitting: Genetic Counselor

## 2014-02-15 DIAGNOSIS — IMO0002 Reserved for concepts with insufficient information to code with codable children: Secondary | ICD-10-CM

## 2014-02-15 DIAGNOSIS — Z803 Family history of malignant neoplasm of breast: Secondary | ICD-10-CM

## 2014-02-15 DIAGNOSIS — Z853 Personal history of malignant neoplasm of breast: Secondary | ICD-10-CM

## 2014-02-15 DIAGNOSIS — D7581 Myelofibrosis: Secondary | ICD-10-CM

## 2014-02-15 DIAGNOSIS — C50919 Malignant neoplasm of unspecified site of unspecified female breast: Secondary | ICD-10-CM | POA: Insufficient documentation

## 2014-02-15 DIAGNOSIS — Z806 Family history of leukemia: Secondary | ICD-10-CM

## 2014-02-15 NOTE — Progress Notes (Signed)
Dr.  Betsy Coder requested a consultation for genetic counseling and risk assessment for [name], a 48 y.o. female, for discussion of her personal history of breast cancer and mylefibrosis.  She presents to clinic today to discuss the possibility of a genetic predisposition to cancer, and to further clarify her risks, as well as her family members' risks for cancer.   HISTORY OF PRESENT ILLNESS: In 1997, at the age of 12, [name] was diagnosed with breast cancer of the left breast. This was treated with chemotherapy and mastectomy.  She took tamoxifen for 5 years.  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  This has been followed by blood work, but is not currently being treated.  She has never had a colonoscopy.    Past Medical History  Diagnosis Date  . Myelofibrosis   . 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 Breast cancer     Past Surgical History  Procedure Laterality Date  . Breast surgery    . Abdominal hysterectomy      History   Social History  . Marital Status: Married    Spouse Name: N/A    Number of Children: 1  . Years of Education: N/A   Occupational History  . SECRETARY    Social History Main Topics  . Smoking status: Never Smoker   . Smokeless tobacco: None  . Alcohol Use: No  . Drug Use: No  . Sexual Activity: None   Other Topics Concern  . None   Social History Narrative  . None    REPRODUCTIVE HISTORY AND PERSONAL RISK ASSESSMENT FACTORS: Menarche was at age 43.   perimenopausal Uterus Intact: no Ovaries Intact: yes G1P1A0, first live birth at age 75  She has not previously undergone treatment for infertility.   Oral Contraceptive use: 7 years   She has not used HRT in the past.    FAMILY HISTORY:  We obtained a detailed, 4-generation family history.  Significant diagnoses are listed below: Family History  Problem Relation Age of Onset  . Breast cancer Maternal Aunt     dx in her 47s  . Lung cancer  Maternal Grandmother     dx in her 40x - smoker  . Lung cancer Maternal Aunt     dx in her 8s - smoker  . Leukemia Cousin 4    maternal first cousin - child of aunt with breast cancer  . Breast cancer Cousin     dx in her 25s - daughter of aunt with lung cancer  . Colon cancer Cousin     paternal first cousin dx in his 88s  . Brain cancer Cousin 23    paternal cousin  . Breast cancer Other     MGM's sister dx in her 44s  . Breast cancer Other     mother's maternal first cousin dx in her 29s - BRCA neg  . Prostate cancer Other     MGM's brother    Patient's ancestors are of Caucasian descent. There is no reported Ashkenazi Jewish ancestry. There is no known consanguinity.  GENETIC COUNSELING ASSESSMENT: [name] is a 48 y.o. female with a personal history of early onset breast cancer and mylefibrosis which somewhat suggestive of a hereditary cancer syndrome and predisposition to cancer. We, therefore, discussed and recommended the following at today's visit.   DISCUSSION: We reviewed the characteristics, features and inheritance patterns of hereditary cancer syndromes. We also discussed  genetic testing, including the appropriate family members to test, the process of testing, insurance coverage and turn-around-time for results. We reviewed BRCA mutations as the most common form of hereditary breast cancer.  However, individuals who are diagnosed at age 85 or younger with breast cancer are at an increased risk for having a TP53 mutation.  Since there are several breast cancers and leukemias in her family we also reviewed ATM mutations.    PLAN: After considering the risks, benefits, and limitations, [name] provided informed consent to pursue genetic testing and the blood sample will be sent to [facility] for analysis of the Breast/Ovarian Cancer Panel. We discussed the implications of a positive, negative and/ or variant of uncertain significance genetic test  result. Results should be available within approximately 3 weeks' time, at which point they will be disclosed by telephone to [name], as will any additional recommendations warranted by these results. [name] will receive a summary of her genetic counseling visit and a copy of her results once available. This information will also be available in Epic. We encouraged [name] to remain in contact with cancer genetics annually so that we can continuously update the family history and inform her of any changes in cancer genetics and testing that may be of benefit for her family. [name]'s questions were answered to her satisfaction today. Our contact information was provided should additional questions or concerns arise.  The patient was seen for a total of 45 minutes, greater than 50% of which was spent face-to-face counseling.  This note will also be sent to the referring provider via the electronic medical record. The patient will be supplied with a summary of this genetic counseling discussion as well as educational information on the discussed hereditary cancer syndromes following the conclusion of their visit.   Patient was discussed with Dr. Marcy Panning.   _______________________________________________________________________ For Office Staff:  Number of people involved in session: 1 Was an Intern/ student involved with case: no

## 2014-03-09 ENCOUNTER — Encounter: Payer: Self-pay | Admitting: Genetic Counselor

## 2014-03-09 NOTE — Progress Notes (Signed)
HISTORY OF PRESENT ILLNESS: HPI:  Ms. Skiff was previously seen in the [place] clinic due to a personal and family history of cancer and concerns regarding a hereditary predisposition to cancer. Please refer to our prior cancer genetics clinic note for more information regarding Ms. Weisensel's medical, social and family histories, and our assessment and recommendations, at the time. Ms. Lingenfelter recent genetic test results were disclosed to her, as were recommendations warranted by these results. These results and recommendations are discussed in more detail below.  GENETIC TEST RESULTS: At the time of Ms. Sidener's visit, we recommended she pursue genetic testing of the Breast/Ovarian cancer gene panel. This test, which included sequencing and deletion/duplication analysis of the genes listed on the test report, was performed at [facility]. 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 Based on these results, we discussed and recommended the following for Ms. Swaney.   CANCER SCREENING AND RECOMMENDATIONS:  1. We first discussed with Ms. Dinino that we are generally reassured by her genetic test results, in that she does not have a pathogenic mutation in one of the known, highly penetrant hereditary cancer genes, such as BRCA1, BRCA2 or TP53. Therefore, we do not feel she is at a significant increased genetic risk for cancer related to a mutation in one of these highly penetrant genes.   2. With respect to her test results indicating she carries one ATM deleterious mutation, we discussed that currently, there are no specific medical management guidelines for cancer risk in ATM heterozygotes. In addition, while it is thought that there is an elevated risk for breast cancer, and potentially pancreatic cancer, in ATM heterozygotes, the  exact cancer risks for these types of cancer have not been well defined. Thus, there remains uncertainty in the medical community in how to interpret these results, and we must be careful to not over interpret such results.   3. We discussed that given the uncertainty of the implications of this ATM gene mutation regarding cancer risk, Ms. Gradel should continue to follow cancer screening guidelines based on her personal and family history of cancer, at least until we are able to learn more about cancer risks in ATM heterozygotes. We, therefore recommend Ms. Kishi meet with a provider to discuss high risk breast cancer risk surveillance. Consideration of annual breast MRI in addition to annual mammograms beginning in the 20s is warranted in this family and on Ms. Stillson's maternal side of the family, given the early onset breast cancer diagnosis.  At this time, screening for pancreatic cancer is not recommended, as again, the exact risk for pancreatic cancer in ATM heterozygotes is unknown, and there is no pancreatic screening shown to be effective in changing prognosis. It would be reasonable for family members to have a baseline colonoscopy in the 7s given the maternal family history of early-onset colon cancer.   4. Genetic testing can sort out who in this family may have inherited the ATM pathogenic mutation and who has not. We would be happy to help meet with and coordinate genetic testing for any relative that is interested in testing.  5. At this time, we would not change medical management due to the BARD1 variant identified. We suspect, with time, this variant will be reclassified by the lab and we will try to recontact Ms. Crochet at that time to discuss implications, if any, of this reclassified variant.  Ms. Gammon understood and agreed with our conversation today, and her questions were answered to her satisfaction. We strongly encouraged Ms. Lingle to remain in contact with cancer  genetics annually so that we can continuously update the family history and inform her of any changes in cancer genetics and testing that may be of benefit for her and her family. Our contact information was provided should additional questions or concerns arise.   Catherine A. Fine, MS, [facility] [employer] [phone]

## 2014-04-06 ENCOUNTER — Other Ambulatory Visit (HOSPITAL_BASED_OUTPATIENT_CLINIC_OR_DEPARTMENT_OTHER): Payer: PRIVATE HEALTH INSURANCE

## 2014-04-06 ENCOUNTER — Ambulatory Visit (HOSPITAL_BASED_OUTPATIENT_CLINIC_OR_DEPARTMENT_OTHER): Payer: PRIVATE HEALTH INSURANCE | Admitting: Oncology

## 2014-04-06 ENCOUNTER — Telehealth: Payer: Self-pay | Admitting: Oncology

## 2014-04-06 VITALS — BP 141/71 | HR 76 | Temp 98.6°F | Resp 18 | Ht 69.0 in | Wt 170.7 lb

## 2014-04-06 DIAGNOSIS — D7581 Myelofibrosis: Secondary | ICD-10-CM

## 2014-04-06 DIAGNOSIS — Z853 Personal history of malignant neoplasm of breast: Secondary | ICD-10-CM

## 2014-04-06 LAB — CBC WITH DIFFERENTIAL/PLATELET
BASO%: 1.1 % (ref 0.0–2.0)
Basophils Absolute: 0.1 10*3/uL (ref 0.0–0.1)
EOS ABS: 0 10*3/uL (ref 0.0–0.5)
EOS%: 0.5 % (ref 0.0–7.0)
HEMATOCRIT: 32.6 % — AB (ref 34.8–46.6)
HGB: 10.5 g/dL — ABNORMAL LOW (ref 11.6–15.9)
LYMPH#: 1.1 10*3/uL (ref 0.9–3.3)
LYMPH%: 19.5 % (ref 14.0–49.7)
MCH: 27.7 pg (ref 25.1–34.0)
MCHC: 32.2 g/dL (ref 31.5–36.0)
MCV: 86 fL (ref 79.5–101.0)
MONO#: 0.4 10*3/uL (ref 0.1–0.9)
MONO%: 7.3 % (ref 0.0–14.0)
NEUT%: 71.6 % (ref 38.4–76.8)
NEUTROS ABS: 4.1 10*3/uL (ref 1.5–6.5)
NRBC: 2 % — AB (ref 0–0)
PLATELETS: 157 10*3/uL (ref 145–400)
RBC: 3.79 10*6/uL (ref 3.70–5.45)
RDW: 16.7 % — ABNORMAL HIGH (ref 11.2–14.5)
WBC: 5.7 10*3/uL (ref 3.9–10.3)

## 2014-04-06 LAB — TECHNOLOGIST REVIEW

## 2014-04-06 NOTE — Telephone Encounter (Signed)
gv dn prnted appt sched and avs for ptf or SEpt

## 2014-04-06 NOTE — Progress Notes (Signed)
  [place] OFFICE PROGRESS NOTE   Diagnosis: Breast cancer, myelofibrosis  INTERVAL HISTORY:   [name] returns as scheduled. The arthralgias and bone pain are relieved with twice daily ibuprofen. She continues to have intermittent night sweats and daytime "hot flashes ". She is working. She reports malaise after working 4 hours. No change in the splenomegaly. No fever.  Objective:  Vital signs in last 24 hours:  Blood pressure 141/71, pulse 76, temperature 98.6 F (37 C), temperature source Oral, resp. rate 18, height $RemoveBe'5\' 9"'UPPkFKCko$  (1.753 m), weight 170 lb 11.2 oz (77.429 kg), SpO2 99.00%.    HEENT: Neck without mass Lymphatics: No cervical, supraclavicular, or axillary nodes Resp: Lungs clear bilaterally Cardio: Regular rate and rhythm GI: No hepatomegaly, the spleen is palpable throughout the left abdomen extending to the umbilicus and across the midline in the upper abdomen Vascular: No leg edema Breasts: Right breast without mass. Status post left mastectomy with an implant in place. No evidence for chest wall tumor recurrence.    Lab Results:  Lab Results  Component Value Date   WBC 5.7 04/06/2014   HGB 10.5* 04/06/2014   HCT 32.6* 04/06/2014   MCV 86.0 04/06/2014   PLT 157 04/06/2014   NEUTROABS 4.1 04/06/2014    Medications: I have reviewed the patient's current medications.  Assessment/Plan: 1. Stage II left-sided breast cancer diagnosed in February 2000. Screening mammogram 10/05/2011 with suggestion of a subcentimeter oval mass on the left superiorly and no suspicious masses, calcifications or architectural distortion in the right breast. She underwent biopsy of the "asymmetric density of concern in the superior aspect of the right breast" on 10/13/2011. Pathology showed a fibroadenoma.  2. 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  3. 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 4. Intermittent bone pain relieved with ibuprofen,  likely a rheumatic manifestation of myelofibrosis. 5. History of intermittent low-grade fever and "night sweats," likely systemic manifestations of myelofibrosis. 6. History of Nose bleeding-she did not report nose bleeding today       7.   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    Disposition:  [name] is stable from a hematologic standpoint. We discussed Jak inhibitor therapy. This could improve the hot flashes, bone pain, and splenomegaly. She does not wish to consider systemic therapy for the myelofibrosis at present.  She continues yearly mammography. She will share the genetic testing results with her family and they're physicians.  [name] will return for an office visit in 4 months.  Ladell Pier, MD  04/06/2014  10:31 AM

## 2014-05-28 ENCOUNTER — Encounter: Payer: Self-pay | Admitting: Internal Medicine

## 2014-05-28 ENCOUNTER — Ambulatory Visit (INDEPENDENT_AMBULATORY_CARE_PROVIDER_SITE_OTHER): Payer: PRIVATE HEALTH INSURANCE | Admitting: Internal Medicine

## 2014-05-28 VITALS — BP 168/90 | HR 81 | Temp 97.9°F | Resp 20 | Ht 69.0 in | Wt 175.0 lb

## 2014-05-28 DIAGNOSIS — C50919 Malignant neoplasm of unspecified site of unspecified female breast: Secondary | ICD-10-CM

## 2014-05-28 DIAGNOSIS — C50912 Malignant neoplasm of unspecified site of left female breast: Secondary | ICD-10-CM

## 2014-05-28 DIAGNOSIS — R030 Elevated blood-pressure reading, without diagnosis of hypertension: Secondary | ICD-10-CM

## 2014-05-28 DIAGNOSIS — D7581 Myelofibrosis: Secondary | ICD-10-CM

## 2014-05-28 MED ORDER — LISINOPRIL-HYDROCHLOROTHIAZIDE 20-25 MG PO TABS: 1.0000 | ORAL_TABLET | Freq: Every day | ORAL | Status: DC

## 2014-05-28 NOTE — Patient Instructions (Addendum)
Limit your sodium (Salt) intake  Please check your blood pressure on a regular basis.  If it is consistently greater than 150/90, please make an office appointment.    It is important that you exercise regularly, at least 20 minutes 3 to 4 times per week.  If you develop chest pain or shortness of breath seek  medical attention.  Return in 6 weeks for followupDASH Eating Plan DASH stands for "Dietary Approaches to Stop Hypertension." The DASH eating plan is a healthy eating plan that has been shown to reduce high blood pressure (hypertension). Additional health benefits may include reducing the risk of type 2 diabetes mellitus, heart disease, and stroke. The DASH eating plan may also help with weight loss. WHAT DO I NEED TO KNOW ABOUT THE DASH EATING PLAN? For the DASH eating plan, you will follow these general guidelines:  Choose foods with a percent daily value for sodium of less than 5% (as listed on the food label).  Use salt-free seasonings or herbs instead of table salt or sea salt.  Check with your health care provider or pharmacist before using salt substitutes.  Eat lower-sodium products, often labeled as "lower sodium" or "no salt added."  Eat fresh foods.  Eat more vegetables, fruits, and low-fat dairy products.  Choose whole grains. Look for the word "whole" as the first word in the ingredient list.  Choose fish and skinless chicken or [place] more often than red meat. Limit fish, poultry, and meat to 6 oz (170 g) each day.  Limit sweets, desserts, sugars, and sugary drinks.  Choose heart-healthy fats.  Limit cheese to 1 oz (28 g) per day.  Eat more home-cooked food and less restaurant, buffet, and fast food.  Limit fried foods.  Cook foods using methods other than frying.  Limit canned vegetables. If you do use them, rinse them well to decrease the sodium.  When eating at a restaurant, ask that your food be prepared with less salt, or no salt if possible. WHAT  FOODS CAN I EAT? Seek help from a dietitian for individual calorie needs. Grains Whole grain or whole wheat bread. Brown rice. Whole grain or whole wheat pasta. Quinoa, bulgur, and whole grain cereals. Low-sodium cereals. Corn or whole wheat flour tortillas. Whole grain cornbread. Whole grain crackers. Low-sodium crackers. Vegetables Fresh or frozen vegetables (raw, steamed, roasted, or grilled). Low-sodium or reduced-sodium tomato and vegetable juices. Low-sodium or reduced-sodium tomato sauce and paste. Low-sodium or reduced-sodium canned vegetables.  Fruits All fresh, canned (in natural juice), or frozen fruits. Meat and Other Protein Products Ground beef (85% or leaner), grass-fed beef, or beef trimmed of fat. Skinless chicken or [place]. Ground chicken or [place]. Pork trimmed of fat. All fish and seafood. Eggs. Dried beans, peas, or lentils. Unsalted nuts and seeds. Unsalted canned beans. Dairy Low-fat dairy products, such as skim or 1% milk, 2% or reduced-fat cheeses, low-fat ricotta or cottage cheese, or plain low-fat yogurt. Low-sodium or reduced-sodium cheeses. Fats and Oils Tub margarines without trans fats. Light or reduced-fat mayonnaise and salad dressings (reduced sodium). Avocado. Safflower, olive, or canola oils. Natural peanut or almond butter. Other Unsalted popcorn and pretzels. The items listed above may not be a complete list of recommended foods or beverages. Contact your dietitian for more options. WHAT FOODS ARE NOT RECOMMENDED? Grains White bread. White pasta. White rice. Refined cornbread. Bagels and croissants. Crackers that contain trans fat. Vegetables Creamed or fried vegetables. Vegetables in a cheese sauce. Regular canned vegetables. Regular canned tomato  sauce and paste. Regular tomato and vegetable juices. Fruits Dried fruits. Canned fruit in light or heavy syrup. Fruit juice. Meat and Other Protein Products Fatty cuts of meat. Ribs, chicken wings, bacon,  sausage, bologna, salami, chitterlings, fatback, hot dogs, bratwurst, and packaged luncheon meats. Salted nuts and seeds. Canned beans with salt. Dairy Whole or 2% milk, cream, half-and-half, and cream cheese. Whole-fat or sweetened yogurt. Full-fat cheeses or blue cheese. Nondairy creamers and whipped toppings. Processed cheese, cheese spreads, or cheese curds. Condiments Onion and garlic salt, seasoned salt, table salt, and sea salt. Canned and packaged gravies. Worcestershire sauce. Tartar sauce. Barbecue sauce. Teriyaki sauce. Soy sauce, including reduced sodium. Steak sauce. Fish sauce. Oyster sauce. Cocktail sauce. Horseradish. Ketchup and mustard. Meat flavorings and tenderizers. Bouillon cubes. Hot sauce. Tabasco sauce. Marinades. Taco seasonings. Relishes. Fats and Oils Butter, stick margarine, lard, shortening, ghee, and bacon fat. Coconut, palm kernel, or palm oils. Regular salad dressings. Other Pickles and olives. Salted popcorn and pretzels. The items listed above may not be a complete list of foods and beverages to avoid. Contact your dietitian for more information. WHERE CAN I FIND MORE INFORMATION? National Heart, Lung, and Blood Institute: travelstabloid.com Document Released: 10/29/2011 Document Revised: 11/14/2013 Document Reviewed: 09/13/2013 [facility] Patient Information 2015 [place], [place]. This information is not intended to replace advice given to you by your health care provider. Make sure you discuss any questions you have with your health care provider.

## 2014-05-28 NOTE — Progress Notes (Signed)
Pre visit review using our clinic review tool, if applicable. No additional management support is needed unless otherwise documented below in the visit note. 

## 2014-05-28 NOTE — Progress Notes (Signed)
Subjective:    Patient ID: [name], female    DOB: 07/21/1966, 48 y.o.   MRN: [id number]  HPI  48 year old patient who has been a hypertensive suspect for some time.  She was last seen in 2011 due to intermittently elevated blood pressure readings.  It was elected to try a nonpharmacologic approach at that time.  She has a strong family history of hypertension.  Will restart blood pressure readings have been quite elevated as high as 192 over 103.  This past weekend.  She had 4 days of headaches associated with hypertension.  Today she is headache free.  She is followed by oncology and, occasionally, she'll I am with elevated blood pressure readings.  Past Medical History  Diagnosis Date  . Myelofibrosis   . 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 Breast cancer     History   Social History  . Marital Status: Married    Spouse Name: N/A    Number of Children: 1  . Years of Education: N/A   Occupational History  . SECRETARY    Social History Main Topics  . Smoking status: Never Smoker   . Smokeless tobacco: Not on file  . Alcohol Use: No  . Drug Use: No  . Sexual Activity: Not on file   Other Topics Concern  . Not on file   Social History Narrative  . No narrative on file    Past Surgical History  Procedure Laterality Date  . Breast surgery    . Abdominal hysterectomy      Family History  Problem Relation Age of Onset  . Breast cancer Maternal Aunt     dx in her 70s  . Lung cancer Maternal Grandmother     dx in her 28x - smoker  . Lung cancer Maternal Aunt     dx in her 19s - smoker  . Leukemia Cousin 4    maternal first cousin - child of aunt with breast cancer  . Breast cancer Cousin     dx in her 22s - daughter of aunt with lung cancer  . Colon cancer Cousin     paternal first cousin dx in his 76s  . Brain cancer Cousin 62    paternal cousin  . Breast cancer Other     MGM's sister dx in her 59s  . Breast cancer Other     mother's maternal first  cousin dx in her 30s - BRCA neg  . Prostate cancer Other     MGM's brother    Allergies  Allergen Reactions  . Clarithromycin Rash    Current Outpatient Prescriptions on File Prior to Visit  Medication Sig Dispense Refill  . alprazolam (XANAX) 2 MG tablet Take 2 mg by mouth at bedtime as needed.      Marland Kitchen ibuprofen (ADVIL,MOTRIN) 800 MG tablet TAKE 1 TABLET BY MOUTH TWICE A DAY AS NEEDED FOR PAIN WITH FOOD  180 tablet  1  . zolpidem (AMBIEN) 10 MG tablet 1 tablet at bedtime as needed.       No current facility-administered medications on file prior to visit.    BP 168/90  Pulse 81  Temp(Src) 97.9 F (36.6 C) (Oral)  Resp 20  Ht 5' 9"  (1.753 m)  Wt 175 lb (79.379 kg)  BMI 25.83 kg/m2  SpO2 99%       Review of Systems  Constitutional: Negative.   HENT: Negative for congestion, dental problem, hearing loss, rhinorrhea, sinus pressure, sore  throat and tinnitus.   Eyes: Negative for pain, discharge and visual disturbance.  Respiratory: Negative for cough and shortness of breath.   Cardiovascular: Negative for chest pain, palpitations and leg swelling.  Gastrointestinal: Negative for nausea, vomiting, abdominal pain, diarrhea, constipation, blood in stool and abdominal distention.  Genitourinary: Negative for dysuria, urgency, frequency, hematuria, flank pain, vaginal bleeding, vaginal discharge, difficulty urinating, vaginal pain and pelvic pain.  Musculoskeletal: Negative for arthralgias, gait problem and joint swelling.  Skin: Negative for rash.  Neurological: Negative for dizziness, syncope, speech difficulty, weakness, numbness and headaches.  Hematological: Negative for adenopathy.  Psychiatric/Behavioral: Negative for behavioral problems, dysphoric mood and agitation. The patient is not nervous/anxious.        Objective:   Physical Exam  Constitutional: She is oriented to person, place, and time. She appears well-developed and well-nourished.  Blood pressure 180/94    HENT:  Head: Normocephalic.  Right Ear: External ear normal.  Left Ear: External ear normal.  Mouth/Throat: Oropharynx is clear and moist.  Eyes: Conjunctivae and EOM are normal. Pupils are equal, round, and reactive to light.  Neck: Normal range of motion. Neck supple. No thyromegaly present.  Cardiovascular: Normal rate, regular rhythm, normal heart sounds and intact distal pulses.   Pulmonary/Chest: Effort normal and breath sounds normal.  Abdominal: Soft. Bowel sounds are normal. She exhibits no mass. There is no tenderness.  Marked splenomegaly  Musculoskeletal: Normal range of motion.  Lymphadenopathy:    She has no cervical adenopathy.  Neurological: She is alert and oriented to person, place, and time.  Skin: Skin is warm and dry. No rash noted.  Psychiatric: She has a normal mood and affect. Her behavior is normal.          Assessment & Plan:   Hypertension.  We'll place on lisinopril, hydrochlorothiazide.  Low salt diet recommended.  Recheck 6 weeks History left breast CA History of myelofibrosis.  Followup hematology/oncology.  She is on a possible bone marrow transplant list at [facility] ([facility]) and is considering chemotherapy.  Due to increase in spleen size

## 2014-06-08 ENCOUNTER — Telehealth: Payer: Self-pay | Admitting: Internal Medicine

## 2014-06-08 MED ORDER — HYDROCHLOROTHIAZIDE 25 MG PO TABS: 25.0000 mg | ORAL_TABLET | Freq: Every day | ORAL | Status: DC

## 2014-06-08 NOTE — Telephone Encounter (Signed)
Patient Information:  Caller Name: [facility]  Phone: [phone]  Patient: [name], [name]  Gender: Female  DOB: 27-Sep-1966  Age: 48 Years  PCP: Bluford Kaufmann (Family Practice > 57yrs old)  Pregnant: No  Office Follow Up:  Does the office need to follow up with this patient?: Yes  Instructions For The Office: Please f/u with pt concerning medication and/or need for appt. Thank you.   Symptoms  Reason For Call & Symptoms: Pt reports she was given Rx Lisinopril 25 mg 05/28/14 for high blood pressure. Pt c/o having no energy, with intermittent  dizziness, lightheadness, SOB with excertion, and BP  21/30 - 110/64.  Pt would like to switch medication.  Reviewed Health History In EMR: Yes  Reviewed Medications In EMR: Yes  Reviewed Allergies In EMR: Yes  Reviewed Surgeries / Procedures: Yes  Date of Onset of Symptoms: 05/28/2014 OB / GYN:  LMP: Unknown  Guideline(s) Used:  No Protocol Available - Sick Adult  Disposition Per Guideline:   Discuss with PCP and Callback by Nurse Today  Reason For Disposition Reached:   Nursing judgment  Advice Given:  N/A  Patient Refused Recommendation:  Patient Requests Prescription  Pt would like different medication.

## 2014-06-08 NOTE — Telephone Encounter (Signed)
Spoke to pt told her to discontinue lisinopril/HCTZ combination and start HCTZ 25 mg one tablet daily. Rx sent to pharmacy. Pt verbalized understanding.

## 2014-06-08 NOTE — Telephone Encounter (Signed)
Noted, Triage note sent to Dr. Raliegh Ip.

## 2014-06-08 NOTE — Telephone Encounter (Signed)
Pt said lisinopril-hydrochlorothiazide (PRINZIDE,ZESTORETIC) 20-25 MG per tablet is making blood pressure really low the lowest she has had was 90/60 . Pt also said she has sob,dizziness,feeling light headed. This started 06/06/14 .  I transferred patient to triage

## 2014-06-08 NOTE — Telephone Encounter (Signed)
Left message on voicemail to call office.  

## 2014-06-08 NOTE — Telephone Encounter (Signed)
Please advise 

## 2014-06-08 NOTE — Telephone Encounter (Signed)
Discontinue hydrochlorothiazide/lisinopril combination  Start hydrochlorothiazide 25 mg, #90 one daily

## 2014-06-11 ENCOUNTER — Other Ambulatory Visit: Payer: Self-pay

## 2014-06-11 ENCOUNTER — Ambulatory Visit (HOSPITAL_BASED_OUTPATIENT_CLINIC_OR_DEPARTMENT_OTHER): Payer: PRIVATE HEALTH INSURANCE | Admitting: Nurse Practitioner

## 2014-06-11 ENCOUNTER — Telehealth: Payer: Self-pay | Admitting: Oncology

## 2014-06-11 ENCOUNTER — Telehealth: Payer: Self-pay | Admitting: Medical Oncology

## 2014-06-11 ENCOUNTER — Telehealth: Payer: Self-pay | Admitting: Nurse Practitioner

## 2014-06-11 ENCOUNTER — Ambulatory Visit: Payer: 59

## 2014-06-11 ENCOUNTER — Telehealth: Payer: Self-pay | Admitting: *Deleted

## 2014-06-11 VITALS — BP 128/84 | HR 93 | Temp 98.0°F | Resp 18 | Ht 69.0 in | Wt 169.3 lb

## 2014-06-11 DIAGNOSIS — N289 Disorder of kidney and ureter, unspecified: Secondary | ICD-10-CM

## 2014-06-11 DIAGNOSIS — C50912 Malignant neoplasm of unspecified site of left female breast: Secondary | ICD-10-CM

## 2014-06-11 DIAGNOSIS — R079 Chest pain, unspecified: Secondary | ICD-10-CM | POA: Insufficient documentation

## 2014-06-11 DIAGNOSIS — D7581 Myelofibrosis: Secondary | ICD-10-CM

## 2014-06-11 DIAGNOSIS — R161 Splenomegaly, not elsewhere classified: Secondary | ICD-10-CM

## 2014-06-11 DIAGNOSIS — R0602 Shortness of breath: Secondary | ICD-10-CM

## 2014-06-11 DIAGNOSIS — Z853 Personal history of malignant neoplasm of breast: Secondary | ICD-10-CM

## 2014-06-11 DIAGNOSIS — D649 Anemia, unspecified: Secondary | ICD-10-CM

## 2014-06-11 LAB — CBC WITH DIFFERENTIAL/PLATELET
HEMATOCRIT: 33.8 % — AB (ref 34.8–46.6)
HEMOGLOBIN: 11 g/dL — AB (ref 11.6–15.9)
MCH: 27.7 pg (ref 25.1–34.0)
MCHC: 32.6 g/dL (ref 31.5–36.0)
MCV: 84.8 fL (ref 79.5–101.0)
Platelets: 216 10*3/uL (ref 145–400)
RBC: 3.99 10*6/uL (ref 3.70–5.45)
RDW: 16.7 % — AB (ref 11.2–14.5)
WBC: 13.3 10*3/uL — AB (ref 3.9–10.3)

## 2014-06-11 LAB — MANUAL DIFFERENTIAL
ALC: 2.5 10*3/uL (ref 0.9–3.3)
ANC (CHCC manual diff): 9.2 10*3/uL — ABNORMAL HIGH (ref 1.5–6.5)
BASOPHIL: 2 % (ref 0–2)
Band Neutrophils: 18 % — ABNORMAL HIGH (ref 0–10)
Blasts: 2 % — ABNORMAL HIGH (ref 0–0)
EOS%: 2 % (ref 0–7)
LYMPH: 19 % (ref 14–49)
METAMYELOCYTES PCT: 3 % — AB (ref 0–0)
MONO: 6 % (ref 0–14)
MYELOCYTES: 4 % — AB (ref 0–0)
OTHER CELL: 0 % (ref 0–0)
PLT EST: ADEQUATE
PROMYELO: 0 % (ref 0–0)
SEG: 44 % (ref 38–77)
Variant Lymph: 0 % (ref 0–0)
nRBC: 0 % (ref 0–0)

## 2014-06-11 LAB — COMPREHENSIVE METABOLIC PANEL (CC13)
ALBUMIN: 4.1 g/dL (ref 3.5–5.0)
ALT: 21 U/L (ref 0–55)
ANION GAP: 8 meq/L (ref 3–11)
AST: 18 U/L (ref 5–34)
Alkaline Phosphatase: 62 U/L (ref 40–150)
BILIRUBIN TOTAL: 0.34 mg/dL (ref 0.20–1.20)
BUN: 39.6 mg/dL — ABNORMAL HIGH (ref 7.0–26.0)
CALCIUM: 9.2 mg/dL (ref 8.4–10.4)
CHLORIDE: 108 meq/L (ref 98–109)
CO2: 20 meq/L — AB (ref 22–29)
Creatinine: 1.6 mg/dL — ABNORMAL HIGH (ref 0.6–1.1)
GLUCOSE: 90 mg/dL (ref 70–140)
Potassium: 4.4 mEq/L (ref 3.5–5.1)
SODIUM: 137 meq/L (ref 136–145)
TOTAL PROTEIN: 7.2 g/dL (ref 6.4–8.3)

## 2014-06-11 LAB — PRO B NATRIURETIC PEPTIDE: PRO B NATRI PEPTIDE: 146 pg/mL — AB (ref <126)

## 2014-06-11 NOTE — Telephone Encounter (Signed)
Per Dr. Benay Spice; notified pt that [name], NP can see pt today at 2:30.  Pt verbalized understanding and confirmed she will be here at 2:30.

## 2014-06-11 NOTE — Telephone Encounter (Signed)
added pt appt....done...pt ok and aware

## 2014-06-11 NOTE — Telephone Encounter (Signed)
Pt called states "my PCP has put me on BP med and last week my BP dropped to 90/60; they did change my medicine but for 2 months now I have been SOB when walking and just giving out"  Pt states this has been getting worse over 2 month period and request to see Dr. Benay Spice sooner than 08/06/14.  Pt states "I'm coughing just a little but all the way around my chest it hurts and I'm scared the breast cancer is spreading"  Pt states pain wakes her up at night.  Right now she rates her pain "8" on scale of 1-10 and has taken Advil for the pain. Note to Dr. Benay Spice.

## 2014-06-11 NOTE — Telephone Encounter (Signed)
gv adn printed pt avs adn sent to lab

## 2014-06-11 NOTE — Telephone Encounter (Signed)
Pt called in and left a voice message and it was sent to the wrong providers nurse. I forwarded this message to Dr. Gearldine Shown nurse.

## 2014-06-12 ENCOUNTER — Other Ambulatory Visit: Payer: Self-pay | Admitting: Nurse Practitioner

## 2014-06-12 ENCOUNTER — Encounter: Payer: Self-pay | Admitting: Nurse Practitioner

## 2014-06-12 ENCOUNTER — Telehealth: Payer: Self-pay | Admitting: *Deleted

## 2014-06-12 DIAGNOSIS — N289 Disorder of kidney and ureter, unspecified: Secondary | ICD-10-CM

## 2014-06-12 NOTE — Progress Notes (Signed)
[place]   Chief Complaint  Patient presents with  . Chest Pain    HPI: [name] 48 y.o. female diagnosed with breast cancer in 2000.  She had a left mastectomy; and has completed 5 years of tamoxifen.  Just been diagnosed in the past with myelofibrosis; and continues to hold on any myelofibrosis treatment at this time.  Patient called the cancer Center today requested an urgent care visit.  She reports that she has been experiencing some mild shortness of breath with exertion for the past 2 months.  She states that she has been experiencing some circumferential low chest wall pain for the past week or so.  She states that this chest pain is nonradiating.  She denies any chest pressure whatsoever.  She also denies any fever or chills.  She denies any recent edema to her extremities.  She denies any pain with inspiration as well.  Patient also notes that her primary provider I did recently discontinue her lisinopril; stating that her blood pressure has actually become to low.  She had was having episodes of increased dizziness and weakness.  Curriculum writer has switched to HCTZ alone; but the patient has not been taking since her blood pressure has been stable with a systolic blood pressure in the 120s.   CURRENT THERAPY: No active treatment plan for patient.   Past Medical History  Diagnosis Date  . Myelofibrosis   . 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 Breast cancer     Past Surgical History  Procedure Laterality Date  . Breast surgery    . Abdominal hysterectomy      has Myelofibrosis; CELLULITIS, FACE; ELEVATED BLOOD PRESSURE; CONTUSION OF BACK; Breast cancer; Chest pain; and Renal insufficiency, mild on her problem list.     is allergic to clarithromycin.    Medication List       This list is accurate as of: 06/11/14 11:59 PM.  Always use your most recent med list.               alprazolam 2 MG tablet  Commonly known as:  XANAX  Take 2 mg  by mouth at bedtime as needed.     hydrochlorothiazide 25 MG tablet  Commonly known as:  HYDRODIURIL  Take 1 tablet (25 mg total) by mouth daily.     ibuprofen 800 MG tablet  Commonly known as:  ADVIL,MOTRIN  TAKE 1 TABLET BY MOUTH TWICE A DAY AS NEEDED FOR PAIN WITH FOOD     zolpidem 10 MG tablet  Commonly known as:  AMBIEN  1 tablet at bedtime as needed.          PHYSICAL EXAMINATION  Filed Vitals:   06/11/14 1445  BP: 128/84  Pulse: 93  Temp: 98 F (36.7 C)  Resp: 18    GENERAL:alert, healthy, no distress, well nourished, well developed and anxious SKIN: skin color, texture, turgor are normal, no rashes or significant lesions HEAD: Normocephalic, No masses, lesions, tenderness or abnormalities EYES: PERRLA, Conjunctiva are pink and non-injected OROPHARYNX:lips, buccal mucosa, and tongue normal  NECK: supple, no adenopathy, no bruits, no JVD LYMPH:  no palpable lymphadenopathy, splenomegaly noted BREAST:breasts appear normal, no suspicious masses, no skin or nipple changes or axillary nodes, left breast implant intact. LUNGS: negative findings:  chest symmetric with normal A/P diameter, no chest deformities noted, normal respiratory rate and rhythm, no chest wall tenderness, diaphragmatic excursion normal, lungs clear to auscultation HEART: regular rate & rhythm,  no murmurs and no gallops ABDOMEN:abdomen soft, non-tender, normal bowel sounds and no masses or organomegaly BACK: No CVA tenderness, Range of motion is normal EXTREMITIES:no edema, no clubbing, no cyanosis  NEURO: alert & oriented x 3 with fluent speech, gait normal  LABORATORY DATA:. CBC  Lab Results  Component Value Date   WBC 13.3* 06/11/2014   RBC 3.99 06/11/2014   HGB 11.0* 06/11/2014   HCT 33.8* 06/11/2014   PLT 216 06/11/2014   MCV 84.8 06/11/2014   MCH 27.7 06/11/2014   MCHC 32.6 06/11/2014   RDW 16.7* 06/11/2014   LYMPHSABS 1.1 04/06/2014   MONOABS 0.4 04/06/2014   EOSABS 0.0 04/06/2014    BASOSABS 0.1 04/06/2014     CMET  Lab Results  Component Value Date   NA 137 06/11/2014   K 4.4 06/11/2014   CL 103 11/07/2012   CO2 20* 06/11/2014   GLUCOSE 90 06/11/2014   BUN 39.6* 06/11/2014   CREATININE 1.6* 06/11/2014   CALCIUM 9.2 06/11/2014   PROT 7.2 06/11/2014   ALBUMIN 4.1 06/11/2014   AST 18 06/11/2014   ALT 21 06/11/2014   ALKPHOS 62 06/11/2014   BILITOT 0.34 06/11/2014   GFRNONAA >90 11/07/2012   GFRAA >90 11/07/2012   EKG:  Rate 93, Normal sinus rhythm, ? AV block, QTC 354.  RADIOGRAPHIC STUDIES: none   ASSESSMENT/PLAN:    Myelofibrosis  Assessment & Plan Patient has history of chronic myelofibrosis.  The patient has chosen to hold on any systemic therapy for her myelofibrosis at this time.  She is noted to have an elevated white count to 13.3  today; but continues to deny any recent fever/chills/other infectious type symptoms.   Breast cancer  Assessment & Plan The patient is status post left mastectomy.  She has also completed 5 years of tamoxifen.  She will continue with yearly mammograms.   Chest pain  Assessment & Plan EKG obtained today shows a rate of 93.  Normal sinus rhythm with a questionable AV block.  QTC was 354.  It appeared in no distress whatsoever today during exam.  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   Renal insufficiency, mild  Assessment & Plan The patient's creatinine has increased from 0.8-1.6.  The patient continues to deny any issues with dehydration.  As she states that she has been urinating as baseline for her.  Chills denies any recent dysuria or hematuria.  Given patient's history of chronic splenomegaly-and new onset circumferential low chest wall pain- will order a renal ultrasound for further evaluation.  Considerable amount of time spent with patient  today reviewing all of her symptoms.  Advised patient to call or go directly to the emergency department if she has any worsening symptoms whatsoever.  Also advised patient that she would need to return for a repeat labs in approximately 7 days.     Patient stated understanding of all instructions; and was in agreement with this plan of care. The patient knows to call the clinic with any problems, questions or concerns.   Review/collaboration with Dr. Benay Spice regarding all aspects of patient's visit today.   Total time spent with patient was 40 minutes;  with greater than 75 percent of that time spent in face to face counseling regarding patient's symptoms, review of EKG results, consult with Dr. Benay Spice,  and coordination of care and follow up.  Disclaimer: This note was dictated with voice recognition software. Similar sounding words can inadvertently be transcribed and may not be corrected upon review.   Drue Second, NP 06/12/2014

## 2014-06-12 NOTE — Assessment & Plan Note (Signed)
The patient's creatinine has increased from 0.8-1.6.  The patient continues to deny any issues with dehydration.  As she states that she has been urinating as baseline for her.  Chills denies any recent dysuria or hematuria.  Given patient's history of chronic splenomegaly-and new onset circumferential low chest wall pain- will order a renal ultrasound for further evaluation.  Considerable amount of time spent with patient today reviewing all of her symptoms.  Advised patient to call or go directly to the emergency department if she has any worsening symptoms whatsoever.  Also advised patient that she would need to return for a repeat labs in approximately 7 days.

## 2014-06-12 NOTE — Assessment & Plan Note (Signed)
Patient has history of chronic myelofibrosis.  The patient has chosen to hold on any systemic therapy for her myelofibrosis at this time.  She is noted to have an elevated white count to 13.3  today; but continues to deny any recent fever/chills/other infectious type symptoms.

## 2014-06-12 NOTE — Assessment & Plan Note (Signed)
EKG obtained today shows a rate of 93.  Normal sinus rhythm with a questionable AV block.  QTC was 354.  It appeared in no distress whatsoever today during exam.  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

## 2014-06-12 NOTE — Telephone Encounter (Signed)
PT. IS STILL HAVING SOME CHEST DISCOMFORT BUT IS HAS NOT WORSEN. SHE IS AWARE OF HER RENAL ULTRASOUND TOMORROW.

## 2014-06-12 NOTE — Progress Notes (Signed)
EKG dtd 06/11/14 sent to scan.

## 2014-06-12 NOTE — Assessment & Plan Note (Signed)
The patient is status post left mastectomy.  She has also completed 5 years of tamoxifen.  She will continue with yearly mammograms.

## 2014-06-13 ENCOUNTER — Ambulatory Visit (HOSPITAL_COMMUNITY)
Admission: RE | Admit: 2014-06-13 | Discharge: 2014-06-13 | Disposition: A | Discharge status: Home / Self Care | Payer: 59 | Source: Ambulatory Visit | Attending: Nurse Practitioner | Admitting: Nurse Practitioner

## 2014-06-13 DIAGNOSIS — R161 Splenomegaly, not elsewhere classified: Secondary | ICD-10-CM | POA: Diagnosis not present

## 2014-06-13 DIAGNOSIS — D7581 Myelofibrosis: Secondary | ICD-10-CM | POA: Insufficient documentation

## 2014-06-13 DIAGNOSIS — N281 Cyst of kidney, acquired: Secondary | ICD-10-CM | POA: Diagnosis not present

## 2014-06-13 DIAGNOSIS — C9590 Leukemia, unspecified not having achieved remission: Secondary | ICD-10-CM | POA: Diagnosis not present

## 2014-06-13 DIAGNOSIS — C50919 Malignant neoplasm of unspecified site of unspecified female breast: Secondary | ICD-10-CM | POA: Diagnosis not present

## 2014-06-13 DIAGNOSIS — N289 Disorder of kidney and ureter, unspecified: Secondary | ICD-10-CM | POA: Diagnosis not present

## 2014-06-13 DIAGNOSIS — K8020 Calculus of gallbladder without cholecystitis without obstruction: Secondary | ICD-10-CM | POA: Insufficient documentation

## 2014-06-14 ENCOUNTER — Telehealth: Payer: Self-pay | Admitting: Oncology

## 2014-06-14 NOTE — Telephone Encounter (Signed)
s.w. pt and advised on July lab....pt ok and aware

## 2014-06-20 ENCOUNTER — Telehealth: Payer: Self-pay | Admitting: *Deleted

## 2014-06-20 NOTE — Telephone Encounter (Signed)
Call from pt requesting renal US results. Per Dr. Benay Spice: [place] was normal, needs repeat BMET. Pt reports she can't come in for lab until 7/31 as scheduled.

## 2014-06-22 ENCOUNTER — Other Ambulatory Visit (HOSPITAL_BASED_OUTPATIENT_CLINIC_OR_DEPARTMENT_OTHER): Payer: PRIVATE HEALTH INSURANCE

## 2014-06-22 ENCOUNTER — Telehealth: Payer: Self-pay | Admitting: *Deleted

## 2014-06-22 DIAGNOSIS — D7581 Myelofibrosis: Secondary | ICD-10-CM

## 2014-06-22 DIAGNOSIS — N289 Disorder of kidney and ureter, unspecified: Secondary | ICD-10-CM

## 2014-06-22 DIAGNOSIS — C50912 Malignant neoplasm of unspecified site of left female breast: Secondary | ICD-10-CM

## 2014-06-22 DIAGNOSIS — R079 Chest pain, unspecified: Secondary | ICD-10-CM

## 2014-06-22 LAB — CBC WITH DIFFERENTIAL/PLATELET
BASO%: 0.9 % (ref 0.0–2.0)
BASOS ABS: 0.1 10*3/uL (ref 0.0–0.1)
EOS%: 0.6 % (ref 0.0–7.0)
Eosinophils Absolute: 0 10*3/uL (ref 0.0–0.5)
HEMATOCRIT: 27.7 % — AB (ref 34.8–46.6)
HEMOGLOBIN: 9 g/dL — AB (ref 11.6–15.9)
LYMPH%: 15.5 % (ref 14.0–49.7)
MCH: 28 pg (ref 25.1–34.0)
MCHC: 32.5 g/dL (ref 31.5–36.0)
MCV: 85.9 fL (ref 79.5–101.0)
MONO#: 0.4 10*3/uL (ref 0.1–0.9)
MONO%: 6.3 % (ref 0.0–14.0)
NEUT#: 5.2 10*3/uL (ref 1.5–6.5)
NEUT%: 76.7 % (ref 38.4–76.8)
PLATELETS: 158 10*3/uL (ref 145–400)
RBC: 3.23 10*6/uL — ABNORMAL LOW (ref 3.70–5.45)
RDW: 16.5 % — ABNORMAL HIGH (ref 11.2–14.5)
WBC: 6.8 10*3/uL (ref 3.9–10.3)
lymph#: 1 10*3/uL (ref 0.9–3.3)

## 2014-06-22 LAB — TECHNOLOGIST REVIEW

## 2014-06-22 LAB — BASIC METABOLIC PANEL (CC13)
Anion Gap: 10 mEq/L (ref 3–11)
BUN: 17.3 mg/dL (ref 7.0–26.0)
CALCIUM: 9.2 mg/dL (ref 8.4–10.4)
CHLORIDE: 110 meq/L — AB (ref 98–109)
CO2: 21 mEq/L — ABNORMAL LOW (ref 22–29)
CREATININE: 1.3 mg/dL — AB (ref 0.6–1.1)
Glucose: 76 mg/dl (ref 70–140)
Potassium: 3.6 mEq/L (ref 3.5–5.1)
Sodium: 141 mEq/L (ref 136–145)

## 2014-06-22 NOTE — Telephone Encounter (Signed)
Called to request lab results: made her aware that creatinine has improved from 1.6 to 1.3 today. Of note, her Hgb today was lower at 9.0, and was 11.0 1 week ago. She reports more fatigue of recent. WBC normal and platelets normal. Informed her that nothing warrants any immediate action-will probably need to monitor. Will let MD know she is aware of results and asking what should be follow up.

## 2014-06-25 ENCOUNTER — Telehealth: Payer: Self-pay | Admitting: *Deleted

## 2014-06-25 DIAGNOSIS — N289 Disorder of kidney and ureter, unspecified: Secondary | ICD-10-CM

## 2014-06-25 NOTE — Telephone Encounter (Signed)
Left message on voicemail informing pt schedulers will contact her with Lab/office visit in one month. Orders entered.

## 2014-06-27 ENCOUNTER — Telehealth: Payer: Self-pay | Admitting: Oncology

## 2014-06-27 NOTE — Telephone Encounter (Signed)
Lft msg labs/ov per 08/03 POF, mailed AVS to pt.Marland Kitchen..KJ

## 2014-06-28 ENCOUNTER — Other Ambulatory Visit: Payer: Self-pay | Admitting: *Deleted

## 2014-06-28 ENCOUNTER — Telehealth: Payer: Self-pay | Admitting: *Deleted

## 2014-06-28 NOTE — Telephone Encounter (Signed)
Call from pt asking why she has been scheduled for weekly lab. Reviewed notes, schedule. Pt should only be scheduled for Lab/office later this month. Called pt with 8/28 appts. Will cancel weekly labs. She voiced understanding.

## 2014-06-29 ENCOUNTER — Other Ambulatory Visit: Payer: PRIVATE HEALTH INSURANCE

## 2014-07-06 ENCOUNTER — Other Ambulatory Visit: Payer: PRIVATE HEALTH INSURANCE

## 2014-07-09 ENCOUNTER — Ambulatory Visit: Payer: PRIVATE HEALTH INSURANCE | Admitting: Internal Medicine

## 2014-07-13 ENCOUNTER — Other Ambulatory Visit: Payer: PRIVATE HEALTH INSURANCE

## 2014-07-16 ENCOUNTER — Telehealth: Payer: Self-pay | Admitting: Oncology

## 2014-07-16 NOTE — Telephone Encounter (Signed)
Pt cld to cancel 08/28, wants to keep 09/14 apt w/Dr. Victorino Sparrow only.Marland Kitchen..KJ

## 2014-07-17 ENCOUNTER — Other Ambulatory Visit: Payer: Self-pay | Admitting: Obstetrics & Gynecology

## 2014-07-18 LAB — CYTOLOGY - PAP

## 2014-07-20 ENCOUNTER — Other Ambulatory Visit: Payer: PRIVATE HEALTH INSURANCE

## 2014-07-20 ENCOUNTER — Ambulatory Visit: Payer: PRIVATE HEALTH INSURANCE | Admitting: Nurse Practitioner

## 2014-08-06 ENCOUNTER — Other Ambulatory Visit (HOSPITAL_BASED_OUTPATIENT_CLINIC_OR_DEPARTMENT_OTHER): Payer: PRIVATE HEALTH INSURANCE

## 2014-08-06 ENCOUNTER — Telehealth: Payer: Self-pay | Admitting: Oncology

## 2014-08-06 ENCOUNTER — Ambulatory Visit (HOSPITAL_BASED_OUTPATIENT_CLINIC_OR_DEPARTMENT_OTHER): Payer: PRIVATE HEALTH INSURANCE | Admitting: Oncology

## 2014-08-06 ENCOUNTER — Ambulatory Visit (HOSPITAL_COMMUNITY)
Admission: RE | Admit: 2014-08-06 | Discharge: 2014-08-06 | Disposition: A | Discharge status: Home / Self Care | Payer: 59 | Source: Ambulatory Visit | Attending: Oncology | Admitting: Oncology

## 2014-08-06 ENCOUNTER — Other Ambulatory Visit: Payer: Self-pay | Admitting: *Deleted

## 2014-08-06 VITALS — BP 160/77 | HR 66 | Temp 98.4°F | Resp 18 | Ht 69.0 in | Wt 171.8 lb

## 2014-08-06 DIAGNOSIS — M79609 Pain in unspecified limb: Secondary | ICD-10-CM | POA: Diagnosis not present

## 2014-08-06 DIAGNOSIS — C50919 Malignant neoplasm of unspecified site of unspecified female breast: Secondary | ICD-10-CM | POA: Insufficient documentation

## 2014-08-06 DIAGNOSIS — M949 Disorder of cartilage, unspecified: Secondary | ICD-10-CM

## 2014-08-06 DIAGNOSIS — D7581 Myelofibrosis: Secondary | ICD-10-CM

## 2014-08-06 DIAGNOSIS — C50912 Malignant neoplasm of unspecified site of left female breast: Secondary | ICD-10-CM

## 2014-08-06 DIAGNOSIS — N289 Disorder of kidney and ureter, unspecified: Secondary | ICD-10-CM

## 2014-08-06 DIAGNOSIS — D630 Anemia in neoplastic disease: Secondary | ICD-10-CM

## 2014-08-06 DIAGNOSIS — M899 Disorder of bone, unspecified: Secondary | ICD-10-CM

## 2014-08-06 DIAGNOSIS — Z853 Personal history of malignant neoplasm of breast: Secondary | ICD-10-CM

## 2014-08-06 LAB — MANUAL DIFFERENTIAL
ALC: 1.3 10*3/uL (ref 0.9–3.3)
ANC (CHCC MAN DIFF): 4.7 10*3/uL (ref 1.5–6.5)
BLASTS: 1 % — AB (ref 0–0)
Band Neutrophils: 1 % (ref 0–10)
Basophil: 0 % (ref 0–2)
EOS: 1 % (ref 0–7)
LYMPH: 20 % (ref 14–49)
MONO: 5 % (ref 0–14)
MYELOCYTES: 5 % — AB (ref 0–0)
Metamyelocytes: 2 % — ABNORMAL HIGH (ref 0–0)
OTHER CELL: 0 % (ref 0–0)
PLT EST: ADEQUATE
PROMYELO: 0 % (ref 0–0)
SEG: 65 % (ref 38–77)
VARIANT LYMPH: 0 % (ref 0–0)
nRBC: 2 % — ABNORMAL HIGH (ref 0–0)

## 2014-08-06 LAB — COMPREHENSIVE METABOLIC PANEL (CC13)
ALK PHOS: 60 U/L (ref 40–150)
ALT: 14 U/L (ref 0–55)
AST: 17 U/L (ref 5–34)
Albumin: 3.8 g/dL (ref 3.5–5.0)
Anion Gap: 9 mEq/L (ref 3–11)
BILIRUBIN TOTAL: 0.55 mg/dL (ref 0.20–1.20)
BUN: 11.9 mg/dL (ref 7.0–26.0)
CO2: 25 mEq/L (ref 22–29)
Calcium: 9.4 mg/dL (ref 8.4–10.4)
Chloride: 108 mEq/L (ref 98–109)
Creatinine: 0.8 mg/dL (ref 0.6–1.1)
Glucose: 90 mg/dl (ref 70–140)
POTASSIUM: 3.6 meq/L (ref 3.5–5.1)
SODIUM: 142 meq/L (ref 136–145)
Total Protein: 6.7 g/dL (ref 6.4–8.3)

## 2014-08-06 LAB — CBC WITH DIFFERENTIAL/PLATELET
HEMATOCRIT: 32.4 % — AB (ref 34.8–46.6)
HGB: 10.3 g/dL — ABNORMAL LOW (ref 11.6–15.9)
MCH: 28.1 pg (ref 25.1–34.0)
MCHC: 31.8 g/dL (ref 31.5–36.0)
MCV: 88.5 fL (ref 79.5–101.0)
PLATELETS: 169 10*3/uL (ref 145–400)
RBC: 3.66 10*6/uL — AB (ref 3.70–5.45)
RDW: 17.4 % — ABNORMAL HIGH (ref 11.2–14.5)
WBC: 6.4 10*3/uL (ref 3.9–10.3)

## 2014-08-06 MED ORDER — TRAMADOL HCL 50 MG PO TABS: 50.0000 mg | ORAL_TABLET | Freq: Four times a day (QID) | ORAL | Status: DC | PRN

## 2014-08-06 NOTE — Progress Notes (Addendum)
[facility] OFF[facility] PROGRESS NOTE   Diagnosis: Breast cancer, myelofibrosis  INTERVAL HISTORY:   She returns as scheduled. She reports increased diffuse bone pain, especially at the upper arms. She also has pain in the neck and back. She takes ibuprofen twice daily. No fever or sweats. She was noted to have an elevated creatinine when she was here in July for evaluation chest discomfort. A renal ultrasound was negative other than splenomegaly.  She feels the spleen is stable in size.  Objective:  Vital signs in last 24 hours:  Blood pressure 160/77, pulse 66, temperature 98.4 F (36.9 C), temperature source Oral, resp. rate 18, height 5' 9" (1.753 m), weight 171 lb 12.8 oz (77.928 kg).    HEENT: Neck without mass Lymphatics: No cervical, supraclavicular, or axillary nodes Resp: Lungs clear bilaterally Cardio: Regular rate and rhythm GI: No hepatomegaly, the spleen is palpable throughout the left abdomen extending across the midline and below the umbilicus. Vascular: No leg edema  Breasts: Right breast without mass, status post left mastectomy with implant in place. No evidence for chest wall tumor recurrence.    Lab Results:  Lab Results  Component Value Date   WBC 6.4 08/06/2014   HGB 10.3* 08/06/2014   HCT 32.4* 08/06/2014   MCV 88.5 08/06/2014   PLT 169 08/06/2014   NEUTROABS 5.2 06/22/2014     Imaging:  Dg Humerus Left  08/06/2014   CLINICAL DATA:  Pain; history of breast carcinoma  EXAM: LEFT HUMERUS - 2+ VIEW  COMPARISON:  None.  FINDINGS: Frontal and lateral views were obtained. No fracture or dislocation. No blastic or lytic bone lesions. No abnormal periosteal reaction. Joint spaces appear intact. There are surgical clips in the left axillary region.  IMPRESSION: No fracture. No blastic or lytic bone lesions. No abnormal periosteal reaction.   Electronically Signed   By: Lowella Grip M.D.   On: 08/06/2014 11:33   Dg Humerus Right  08/06/2014    CLINICAL DATA:  Arm pain; history of breast carcinoma  EXAM: RIGHT HUMERUS - 2+ VIEW  COMPARISON:  None.  FINDINGS: Frontal and lateral views were obtained. No fracture or dislocation. No blastic or lytic bone lesions. No abnormal periosteal reaction. Joint spaces appear intact.  IMPRESSION: No abnormality noted.   Electronically Signed   By: Lowella Grip M.D.   On: 08/06/2014 11:32    Medications: I have reviewed the patient's current medications.  Assessment/Plan: 1. Stage II left-sided breast cancer diagnosed in February 2000. Screening mammogram 10/05/2011 with suggestion of a subcentimeter oval mass on the left superiorly and no suspicious masses, calcifications or architectural distortion in the right breast. She underwent biopsy of the "asymmetric density of concern in the superior aspect of the right breast" on 10/13/2011. Pathology showed a fibroadenoma.  2. 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  3. 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 4. Intermittent bone pain relieved with ibuprofen, likely a rheumatic manifestation of myelofibrosis. 5. History of intermittent low-grade fever and "night sweats," likely systemic manifestations of myelofibrosis. 6. History of Nose bleeding-she did not report nose bleeding today       7.  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        8.  Elevated creatinine July 2015-normal 08/06/2014, potentially related to blood pressure medication   Disposition:  The bone pain is likely secondary to myelofibrosis. She agrees to a trial of ruxolitinib if her insurance will pay for this. If not we will consider hydroxyurea therapy. She will continue ibuprofen as needed for pain.  Ms. Amer will return for an office visit in one  month.    SHERRILL, GARY, MD  08/06/2014  5:34 PM    

## 2014-08-06 NOTE — Telephone Encounter (Signed)
gv adn printed appt sched and avs for pr to OCT....sent pt to ct

## 2014-08-07 ENCOUNTER — Telehealth: Payer: Self-pay | Admitting: *Deleted

## 2014-08-07 NOTE — Telephone Encounter (Signed)
Message copied by Tania Ade on Tue Aug 07, 2014  3:45 PM ------      Message from: Betsy Coder B      Created: Mon Aug 06, 2014  5:53 PM       Please call patient, [name] are negative and creatinine is normal ------

## 2014-08-07 NOTE — Telephone Encounter (Signed)
Notified of negative xrays and normal renal function results.

## 2014-08-24 ENCOUNTER — Other Ambulatory Visit: Payer: Self-pay | Admitting: *Deleted

## 2014-08-24 DIAGNOSIS — C50912 Malignant neoplasm of unspecified site of left female breast: Secondary | ICD-10-CM

## 2014-08-24 DIAGNOSIS — D7581 Myelofibrosis: Secondary | ICD-10-CM

## 2014-08-24 MED ORDER — RUXOLITINIB PHOSPHATE 15 MG PO TABS: 15.0000 mg | ORAL_TABLET | Freq: Two times a day (BID) | ORAL | Status: DC

## 2014-08-24 MED ORDER — TRAMADOL HCL 50 MG PO TABS: 50.0000 mg | ORAL_TABLET | Freq: Four times a day (QID) | ORAL | Status: DC | PRN

## 2014-08-24 NOTE — Progress Notes (Signed)
Per Dr. Benay Spice : Process script for Jakafi 15 mg bid to determine patient's out of pocket cost. Patient will not agree to this therapy until she knows cost. Sent to [facility].

## 2014-08-27 ENCOUNTER — Encounter: Payer: Self-pay | Admitting: *Deleted

## 2014-08-27 NOTE — Progress Notes (Signed)
RECEIVED A FAX FROM [facility] CONCERNING A CONFIRMATION OF FACSIMILE RECEIPT FOR PT. REFERRAL. 

## 2014-08-29 ENCOUNTER — Telehealth: Payer: Self-pay | Admitting: *Deleted

## 2014-08-29 NOTE — Telephone Encounter (Signed)
Called [facility] re: [name] cost. Rep stated they were unable to reach patient, co-pay is $75 per 30 day supply, with co-pay assistance available if needed. Information relayed to patient, and also gave direct number to fill prescription: [phone]. Patient voices understanding and voiced that she would able to fill prescription. Did not specify when.

## 2014-09-04 ENCOUNTER — Ambulatory Visit (HOSPITAL_BASED_OUTPATIENT_CLINIC_OR_DEPARTMENT_OTHER): Payer: PRIVATE HEALTH INSURANCE | Admitting: Oncology

## 2014-09-04 ENCOUNTER — Other Ambulatory Visit: Payer: Self-pay | Admitting: *Deleted

## 2014-09-04 ENCOUNTER — Telehealth: Payer: Self-pay | Admitting: Oncology

## 2014-09-04 ENCOUNTER — Other Ambulatory Visit (HOSPITAL_BASED_OUTPATIENT_CLINIC_OR_DEPARTMENT_OTHER): Payer: PRIVATE HEALTH INSURANCE

## 2014-09-04 VITALS — BP 152/84 | HR 98 | Temp 98.6°F | Resp 20 | Ht 69.0 in | Wt 170.9 lb

## 2014-09-04 DIAGNOSIS — D7581 Myelofibrosis: Secondary | ICD-10-CM

## 2014-09-04 DIAGNOSIS — C50912 Malignant neoplasm of unspecified site of left female breast: Secondary | ICD-10-CM

## 2014-09-04 LAB — MANUAL DIFFERENTIAL
ALC: 0.8 10*3/uL — ABNORMAL LOW (ref 0.9–3.3)
ANC (CHCC manual diff): 5.4 10*3/uL (ref 1.5–6.5)
BLASTS: 1 % — AB (ref 0–0)
Band Neutrophils: 13 % — ABNORMAL HIGH (ref 0–10)
Basophil: 0 % (ref 0–2)
EOS: 0 % (ref 0–7)
LYMPH: 12 % — ABNORMAL LOW (ref 14–49)
MONO: 4 % (ref 0–14)
Metamyelocytes: 9 % — ABNORMAL HIGH (ref 0–0)
Myelocytes: 2 % — ABNORMAL HIGH (ref 0–0)
NRBC: 0 % (ref 0–0)
Other Cell: 0 % (ref 0–0)
PLT EST: ADEQUATE
PROMYELO: 0 % (ref 0–0)
SEG: 59 % (ref 38–77)
VARIANT LYMPH: 0 % (ref 0–0)

## 2014-09-04 LAB — CBC WITH DIFFERENTIAL/PLATELET
HEMATOCRIT: 34.6 % — AB (ref 34.8–46.6)
HGB: 11 g/dL — ABNORMAL LOW (ref 11.6–15.9)
MCH: 27.6 pg (ref 25.1–34.0)
MCHC: 31.8 g/dL (ref 31.5–36.0)
MCV: 86.7 fL (ref 79.5–101.0)
Platelets: 171 10*3/uL (ref 145–400)
RBC: 3.99 10*6/uL (ref 3.70–5.45)
RDW: 16.3 % — ABNORMAL HIGH (ref 11.2–14.5)
WBC: 6.5 10*3/uL (ref 3.9–10.3)

## 2014-09-04 LAB — BASIC METABOLIC PANEL (CC13)
ANION GAP: 9 meq/L (ref 3–11)
BUN: 10.5 mg/dL (ref 7.0–26.0)
CO2: 23 mEq/L (ref 22–29)
Calcium: 9.7 mg/dL (ref 8.4–10.4)
Chloride: 107 mEq/L (ref 98–109)
Creatinine: 0.8 mg/dL (ref 0.6–1.1)
Glucose: 91 mg/dl (ref 70–140)
Potassium: 3.6 mEq/L (ref 3.5–5.1)
SODIUM: 139 meq/L (ref 136–145)

## 2014-09-04 MED ORDER — HYDROCODONE-ACETAMINOPHEN 5-325 MG PO TABS: 1.0000 | ORAL_TABLET | ORAL | Status: DC | PRN

## 2014-09-04 MED ORDER — HYDROXYUREA 500 MG PO CAPS: 500.0000 mg | ORAL_CAPSULE | ORAL | Status: DC

## 2014-09-04 NOTE — Telephone Encounter (Signed)
Pt confirmed labs/ov per 10/13 POF, gave pt AVS.... KJ °

## 2014-09-04 NOTE — Progress Notes (Signed)
  [facility] OFFICE PROGRESS NOTE   Diagnosis: Myelofibrosis  INTERVAL HISTORY:   [name] returns as scheduled. She continues to have bone pain and night sweats. Tramadol did not relieve the pain. Ibuprofen helps. She has been unable to obtain the Ruxolitinib secondary to cost. She reports the confusion recent nosebleeds.  Objective:  Vital signs in last 24 hours:  Blood pressure 152/84, pulse 98, temperature 98.6 F (37 C), temperature source Oral, resp. rate 20, height $RemoveBe'5\' 9"'FnwhdgNBt$  (1.753 m), weight 170 lb 14.4 oz (77.52 kg), SpO2 99.00%.    HEENT: Neck without mass Lymphatics: No cervical or supraclavicular nodes Resp: Lungs clear bilateral Cardio: Regular rate and rhythm GI: No hepatomegaly. The spleen is palpable a few fingers right of the midline and to the level of the umbilicus Vascular: No leg edema   Lab Results:  Lab Results  Component Value Date   WBC 6.4 08/06/2014   HGB 10.3* 08/06/2014   HCT 32.4* 08/06/2014   MCV 88.5 08/06/2014   PLT 169 08/06/2014   NEUTROABS 5.2 06/22/2014    Medications: I have reviewed the patient's current medications.  Assessment/Plan: 1. Stage II left-sided breast cancer diagnosed in February 2000. Screening mammogram 10/05/2011 with suggestion of a subcentimeter oval mass on the left superiorly and no suspicious masses, calcifications or architectural distortion in the right breast. She underwent biopsy of the "asymmetric density of concern in the superior aspect of the right breast" on 10/13/2011. Pathology showed a fibroadenoma.  2. 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  3. 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 4. Bone pain relieved with ibuprofen, likely a rheumatic manifestation of myelofibrosis. 5. History of intermittent low-grade fever and "night sweats," likely systemic manifestations of myelofibrosis. 6. History of Nose bleeding-likely related to myelofibrosis 7. 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  8. Elevated creatinine July 2015-normal 08/06/2014, potentially related to blood pressure medication    Disposition:  She remains asymptomatic from the myelofibrosis with bone pain and sweats. She has been unable to obtain the Ruxolitinib secondary to insurance cost. We decided to begin a trial of hydroxyurea. She will begin hydroxyurea dose of 500 mg every other day. We reviewed the potential toxicities associated with hydroxyurea including a chance for hematologic toxicity, nausea, mucositis, and elevated liver enzymes. She agrees to proceed.  [name] will return for a CBC in 2 weeks and an office visit in one month. We will consider pursuing patient assistance for the Roxolitinib if her symptoms do not improve with hydroxyurea. She will try hydrocodone for bone pain.  Betsy Coder, MD  09/04/2014  9:41 AM

## 2014-09-18 ENCOUNTER — Other Ambulatory Visit: Payer: PRIVATE HEALTH INSURANCE

## 2014-09-18 ENCOUNTER — Telehealth: Payer: Self-pay | Admitting: Oncology

## 2014-09-18 NOTE — Telephone Encounter (Signed)
pt called and r/s 10/27 appt to 11/3 - pt has new d/t

## 2014-09-25 ENCOUNTER — Telehealth: Payer: Self-pay | Admitting: *Deleted

## 2014-09-25 ENCOUNTER — Ambulatory Visit (HOSPITAL_BASED_OUTPATIENT_CLINIC_OR_DEPARTMENT_OTHER): Payer: BLUE CROSS/BLUE SHIELD

## 2014-09-25 DIAGNOSIS — D7581 Myelofibrosis: Secondary | ICD-10-CM

## 2014-09-25 LAB — CBC WITH DIFFERENTIAL/PLATELET
HCT: 32.6 % — ABNORMAL LOW (ref 34.8–46.6)
HGB: 10.5 g/dL — ABNORMAL LOW (ref 11.6–15.9)
MCH: 27.8 pg (ref 25.1–34.0)
MCHC: 32.2 g/dL (ref 31.5–36.0)
MCV: 86.3 fL (ref 79.5–101.0)
PLATELETS: 161 10*3/uL (ref 145–400)
RBC: 3.78 10*6/uL (ref 3.70–5.45)
RDW: 16.6 % — ABNORMAL HIGH (ref 11.2–14.5)
WBC: 5 10*3/uL (ref 3.9–10.3)

## 2014-09-25 LAB — MANUAL DIFFERENTIAL
ALC: 0.8 10*3/uL — AB (ref 0.9–3.3)
ANC (CHCC manual diff): 3.8 10*3/uL (ref 1.5–6.5)
BLASTS: 1 % — AB (ref 0–0)
Band Neutrophils: 24 % — ABNORMAL HIGH (ref 0–10)
LYMPH: 16 % (ref 14–49)
MONO: 7 % (ref 0–14)
MYELOCYTES: 5 % — AB (ref 0–0)
Metamyelocytes: 7 % — ABNORMAL HIGH (ref 0–0)
PLT EST: ADEQUATE
SEG: 40 % (ref 38–77)

## 2014-09-25 MED ORDER — HYDROCODONE-ACETAMINOPHEN 5-325 MG PO TABS: 1.0000 | ORAL_TABLET | ORAL | Status: DC | PRN

## 2014-09-25 NOTE — Telephone Encounter (Addendum)
Spoke with pt, she understands to continue Hydrea 500 mg QOD. Follow up 11/17 as scheduled. She voiced understanding. Requested refill on Hydrocodone. Rx left in prescription book for pick up.

## 2014-09-25 NOTE — Telephone Encounter (Signed)
-----   Message from Ladell Pier, MD sent at 09/25/2014  1:56 PM EST ----- Please call patient, same hydrea, f/u as scheduled

## 2014-10-02 ENCOUNTER — Emergency Department (HOSPITAL_COMMUNITY)
Admission: EM | Admit: 2014-10-02 | Discharge: 2014-10-02 | Disposition: A | Discharge status: Home / Self Care | Payer: 59 | Attending: Emergency Medicine | Admitting: Emergency Medicine

## 2014-10-02 ENCOUNTER — Encounter (HOSPITAL_COMMUNITY): Payer: Self-pay

## 2014-10-02 ENCOUNTER — Telehealth: Payer: Self-pay | Admitting: *Deleted

## 2014-10-02 DIAGNOSIS — R040 Epistaxis: Secondary | ICD-10-CM | POA: Diagnosis not present

## 2014-10-02 DIAGNOSIS — Z853 Personal history of malignant neoplasm of breast: Secondary | ICD-10-CM | POA: Insufficient documentation

## 2014-10-02 DIAGNOSIS — Z862 Personal history of diseases of the blood and blood-forming organs and certain disorders involving the immune mechanism: Secondary | ICD-10-CM | POA: Insufficient documentation

## 2014-10-02 DIAGNOSIS — Z79899 Other long term (current) drug therapy: Secondary | ICD-10-CM | POA: Insufficient documentation

## 2014-10-02 DIAGNOSIS — Z856 Personal history of leukemia: Secondary | ICD-10-CM | POA: Diagnosis not present

## 2014-10-02 LAB — CBC WITH DIFFERENTIAL/PLATELET
Basophils Absolute: 0.1 10*3/uL (ref 0.0–0.1)
Basophils Relative: 1 % (ref 0–1)
Eosinophils Absolute: 0 10*3/uL (ref 0.0–0.7)
Eosinophils Relative: 0 % (ref 0–5)
HCT: 35.7 % — ABNORMAL LOW (ref 36.0–46.0)
Hemoglobin: 11.5 g/dL — ABNORMAL LOW (ref 12.0–15.0)
Lymphocytes Relative: 23 % (ref 12–46)
Lymphs Abs: 3 10*3/uL (ref 0.7–4.0)
MCH: 28.4 pg (ref 26.0–34.0)
MCHC: 32.2 g/dL (ref 30.0–36.0)
MCV: 88.1 fL (ref 78.0–100.0)
Monocytes Absolute: 1 10*3/uL (ref 0.1–1.0)
Monocytes Relative: 8 % (ref 3–12)
Neutro Abs: 8.9 10*3/uL — ABNORMAL HIGH (ref 1.7–7.7)
Neutrophils Relative %: 68 % (ref 43–77)
Platelets: 313 10*3/uL (ref 150–400)
RBC: 4.05 MIL/uL (ref 3.87–5.11)
RDW: 17.2 % — ABNORMAL HIGH (ref 11.5–15.5)
WBC: 13 10*3/uL — ABNORMAL HIGH (ref 4.0–10.5)
nRBC: 3 /100 WBC — ABNORMAL HIGH

## 2014-10-02 MED ORDER — SODIUM CHLORIDE 0.9 % IV BOLUS (SEPSIS)
1000.0000 mL | Freq: Once | INTRAVENOUS | Status: AC
  Administered 2014-10-02: 1000 mL via INTRAVENOUS

## 2014-10-02 MED ORDER — ONDANSETRON 4 MG PO TBDP
4.0000 mg | ORAL_TABLET | Freq: Once | ORAL | Status: AC
  Administered 2014-10-02: 4 mg via ORAL
  Filled 2014-10-02: qty 1

## 2014-10-02 MED ORDER — ONDANSETRON HCL 4 MG/2ML IJ SOLN
4.0000 mg | Freq: Once | INTRAMUSCULAR | Status: AC
  Administered 2014-10-02: 4 mg via INTRAVENOUS
  Filled 2014-10-02: qty 2

## 2014-10-02 MED ORDER — CEPHALEXIN 500 MG PO CAPS: 500.0000 mg | ORAL_CAPSULE | Freq: Two times a day (BID) | ORAL | Status: DC

## 2014-10-02 NOTE — ED Notes (Signed)
Family at bedside. 

## 2014-10-02 NOTE — ED Provider Notes (Signed)
CSN: [id number]     Arrival date & time 10/02/14  1038 History   First MD Initiated Contact with Patient 10/02/14 1045     Chief Complaint  Patient presents with  . Epistaxis     (Consider location/radiation/quality/duration/timing/severity/associated sxs/prior Treatment) HPI   48 year old female with epistaxis. Onset this morning. Patient is unable to control it with direct pressure. Has had mild nosebleeds previously, but nothing to this degree. Denies any acute trauma. Denies any bleeding from anywhere else. Feels very nauseated.   Past Medical History  Diagnosis Date  . Myelofibrosis   . 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 Breast cancer    Past Surgical History  Procedure Laterality Date  . Breast surgery    . Abdominal hysterectomy     Family History  Problem Relation Age of Onset  . Breast cancer Maternal Aunt     dx in her 14s  . Lung cancer Maternal Grandmother     dx in her 52x - smoker  . Lung cancer Maternal Aunt     dx in her 60s - smoker  . Leukemia Cousin 4    maternal first cousin - child of aunt with breast cancer  . Breast cancer Cousin     dx in her 9s - daughter of aunt with lung cancer  . Colon cancer Cousin     paternal first cousin dx in his 42s  . Brain cancer Cousin 69    paternal cousin  . Breast cancer Other     MGM's sister dx in her 38s  . Breast cancer Other     mother's maternal first cousin dx in her 59s - BRCA neg  . Prostate cancer Other     MGM's brother   History  Substance Use Topics  . Smoking status: Never Smoker   . Smokeless tobacco: Not on file  . Alcohol Use: No   OB History    No data available     Review of Systems  All systems reviewed and negative, other than as noted in HPI.   Allergies  Clarithromycin  Home Medications   Prior to Admission medications   Medication Sig Start Date End Date Taking? Authorizing Provider  ALPRAZolam Duanne Moron) 0.5 MG tablet Take 0.5 mg by mouth 3 (three) times daily.   Yes  Historical Provider, MD  HYDROcodone-acetaminophen (NORCO/VICODIN) 5-325 MG per tablet Take 1 tablet by mouth every 4 (four) hours as needed for moderate pain. 09/25/14  Yes Ladell Pier, MD  hydroxyurea (HYDREA) 500 MG capsule Take 1 capsule (500 mg total) by mouth every other day. May take with food to minimize GI side effects. 09/04/14  Yes Ladell Pier, MD  ibuprofen (ADVIL,MOTRIN) 800 MG tablet Take 800 mg by mouth 2 (two) times daily.   Yes Historical Provider, MD  zolpidem (AMBIEN) 10 MG tablet Take 10 mg by mouth at bedtime as needed.  06/02/13  Yes Historical Provider, MD  alprazolam Duanne Moron) 2 MG tablet Take 2 mg by mouth at bedtime as needed.    Historical Provider, MD  cephALEXin (KEFLEX) 500 MG capsule Take 1 capsule (500 mg total) by mouth 2 (two) times daily. 10/02/14   Virgel Manifold, MD  ibuprofen (ADVIL,MOTRIN) 800 MG tablet TAKE 1 TABLET BY MOUTH TWICE A DAY AS NEEDED FOR PAIN WITH FOOD Patient not taking: Reported on 10/02/2014    Ladell Pier, MD  ruxolitinib phosphate (JAKAFI) 15 MG tablet Take 1 tablet (15 mg total) by mouth 2 (  two) times daily. Patient not taking: Reported on 10/02/2014 08/24/14   Ladell Pier, MD  traMADol (ULTRAM) 50 MG tablet Take 1 tablet (50 mg total) by mouth every 6 (six) hours as needed. Patient not taking: Reported on 10/02/2014 08/24/14   Ladell Pier, MD   BP 104/58 mmHg  Pulse 76  Temp(Src) 98.2 F (36.8 C) (Oral)  Resp 13  SpO2 97% Physical Exam  Constitutional: She appears well-developed and well-nourished. No distress.  HENT:  Head: Normocephalic and atraumatic.  Nose: Epistaxis is observed.  Very brisk bleeding from R nare. Little/no clot. Could not suction quick enough to identify discrete source of bleeding. Blood noted in posterior pharynx.   Eyes: Conjunctivae are normal. Right eye exhibits no discharge. Left eye exhibits no discharge.  Neck: Neck supple.  Cardiovascular: Normal rate, regular rhythm and normal heart  sounds.  Exam reveals no gallop and no friction rub.   No murmur heard. Pulmonary/Chest: Effort normal and breath sounds normal. No respiratory distress.  Abdominal: Soft. She exhibits no distension. There is no tenderness.  Musculoskeletal: She exhibits no edema or tenderness.  Neurological: She is alert.  Skin: Skin is warm and dry.  Psychiatric: She has a normal mood and affect. Her behavior is normal. Thought content normal.  Nursing note and vitals reviewed.   ED Course  EPISTAXIS MANAGEMENT Date/Time: 10/02/2014 2:07 PM Performed by: Virgel Manifold Authorized by: Virgel Manifold Consent: Verbal consent obtained. Risks and benefits: risks, benefits and alternatives were discussed Consent given by: patient Patient identity confirmed: verbally with patient and provided demographic data Local anesthetic: topical anesthetic Repair method: suction and merocel sponge Post-procedure assessment: bleeding stopped Treatment complexity: complex Comments: Bleeding from R nare. Suctioned but brisk bleeding to degree that couldn't identify exact source. Did not respond to pressure and administration of Afrin. Ended up packing with 10cm merocel. Bleeding controlled. Observed and no evidence of significant continued bleeding prior to discharge.    (including critical care time) Labs Review Labs Reviewed  CBC WITH DIFFERENTIAL - Abnormal; Notable for the following:    WBC 13.0 (*)    Hemoglobin 11.5 (*)    HCT 35.7 (*)    RDW 17.2 (*)    nRBC 3 (*)    Neutro Abs 8.9 (*)    All other components within normal limits    Imaging Review No results found.   EKG Interpretation None      MDM   Final diagnoses:  Epistaxis    48 year old female with epistaxis. Had to be packed. No continued bleeding on rechecks. She was recently started on hydroxyurea. May have potentially developed epistaxis related to this. I think reasonable to stop for short term until she can discuss further with  her hematologist. Suspect near syncope in ED related to n/v and then discomfort with placement of packing. HD stable. Ambulated prior to DC with no complaints. H/H stable to slightly better than baseline. Currently feeling much better.  Abx while packing in place. ENT FU.    Virgel Manifold, MD 10/04/14 [phone]

## 2014-10-02 NOTE — Discharge Instructions (Signed)

## 2014-10-02 NOTE — Telephone Encounter (Signed)
Pt left message on voicemail at [phone]- nosebleed that won't stop. Noted pt is being seen in ED. Will make Dr. Benay Spice aware.

## 2014-10-02 NOTE — ED Notes (Signed)
This Probation officer assessed this pt for the first time at 10:58am. Did not perform initial triage to room however was performed under this writer's name.

## 2014-10-02 NOTE — ED Notes (Addendum)
Pt. Walked well to the restroom she stated that she felt well. She did not feel any dizziness or sickness from walking.

## 2014-10-02 NOTE — ED Notes (Signed)
MD at bedside. 

## 2014-10-02 NOTE — ED Notes (Signed)
Per pt, states nose bleed that started this am-started on new cancer med

## 2014-10-02 NOTE — ED Notes (Signed)
MD notified, ENT cart at bedside

## 2014-10-05 ENCOUNTER — Telehealth: Payer: Self-pay | Admitting: Oncology

## 2014-10-05 ENCOUNTER — Telehealth: Payer: Self-pay | Admitting: *Deleted

## 2014-10-05 MED ORDER — HYDROCODONE-ACETAMINOPHEN 5-325 MG PO TABS: 1.0000 | ORAL_TABLET | ORAL | Status: DC | PRN

## 2014-10-05 NOTE — Telephone Encounter (Signed)
Call from pt requesting refill on Hydrocodone. She has not noticed any change in bone pain since beginning Hydrea. She reports the nosebleed on 11/10 was worse than any she'd had in the past. It was packed in ED, packing to be removed 11/16. Was told to [facility] during ED visit.  Dr. Benay Spice made aware. Rx will be left in prescription book for pick up.

## 2014-10-05 NOTE — Telephone Encounter (Signed)
Pt lft msg cancelling labs/ov per 11/17, pt will c/b to r/s .Marland Kitchen... KJ

## 2014-10-09 ENCOUNTER — Ambulatory Visit: Payer: PRIVATE HEALTH INSURANCE | Admitting: Oncology

## 2014-10-09 ENCOUNTER — Telehealth: Payer: Self-pay | Admitting: Internal Medicine

## 2014-10-09 ENCOUNTER — Other Ambulatory Visit: Payer: PRIVATE HEALTH INSURANCE

## 2014-10-09 NOTE — Telephone Encounter (Signed)
Has had bad side effects to new chemo med that is oral and she had bad nose bleeds. Has had a severe headache and they are not sure if it is a side effect of the new chemo med or if it is related to her high blood pressure. She has two medications that DR K has rx'd for her elevated BP and needs to know the name of the most recent medication to start taking it again.

## 2014-10-09 NOTE — Telephone Encounter (Signed)
Severe nose bleed that lasted 7 hours.  Pt went to the ED and a nose plug was inserted.  Pt went to ENT and they took nose plug out yesterday.  Pt has had a severe headache and at first they thought it was due to all the trama from the ED and ENT but pt was told her blood pressure was borderline.  Pt has felt better after practicing a low sodium diet but still wants to make sure.  Pt has two prescriptions Lisnopril-hctz 20-25 and HCTZ 25 mg.  Pt is not sure which one to use.  Per last note from Dr. Raliegh Ip on 7.6.15 Lisnopril-HCTZ was discontinued and changed. Advised pt's daughter that she needed to come in to be evaluated.  Called and spoke with pt's daughter and she is aware of appt on 10/10/2014 at 11:15 am.

## 2014-10-10 ENCOUNTER — Encounter: Payer: Self-pay | Admitting: Internal Medicine

## 2014-10-10 ENCOUNTER — Ambulatory Visit (INDEPENDENT_AMBULATORY_CARE_PROVIDER_SITE_OTHER): Payer: PRIVATE HEALTH INSURANCE | Admitting: Internal Medicine

## 2014-10-10 VITALS — BP 156/90 | HR 104 | Temp 98.3°F | Resp 18 | Ht 69.0 in | Wt 164.0 lb

## 2014-10-10 DIAGNOSIS — R030 Elevated blood-pressure reading, without diagnosis of hypertension: Secondary | ICD-10-CM

## 2014-10-10 MED ORDER — HYDROCHLOROTHIAZIDE 25 MG PO TABS: 25.0000 mg | ORAL_TABLET | Freq: Every day | ORAL | Status: DC

## 2014-10-10 MED ORDER — HYDROCODONE-ACETAMINOPHEN 5-325 MG PO TABS: 1.0000 | ORAL_TABLET | ORAL | Status: DC | PRN

## 2014-10-10 MED ORDER — TRAMADOL HCL 50 MG PO TABS: 50.0000 mg | ORAL_TABLET | Freq: Four times a day (QID) | ORAL | Status: DC | PRN

## 2014-10-10 NOTE — Progress Notes (Signed)
Pre visit review using our clinic review tool, if applicable. No additional management support is needed unless otherwise documented below in the visit note. 

## 2014-10-10 NOTE — Progress Notes (Signed)
Subjective:    Patient ID: [name], female    DOB: July 08, 1966, 48 y.o.   MRN: [id number]  HPI BP Readings from Last 3 Encounters:  10/10/14 156/90  10/02/14 122/68  09/04/14 75/40   48 year old patient who has a history of labile hypertension.  She was placed on combination lisinopril, hydrochlorothiazide, in July due to stage II hypertension.  She developed low readings and was switched to hydrochlorothiazide only.  She self discontinued this medication later. More recently she has been seen at the urgent care for right-sided epistaxis.  She was seen in follow-up by ENT 2 days ago.  Her packing was removed and area was cauterized.  For the past week she has had some right-sided headaches and general sense of unwellness. She has a history of myelofibrosis and is followed by oncology.  Most recent blood pressure reading was normal, although she has been intermittently  slightly elevated.  She has been on hydrocodone with minimal improvement of her pain.  No fever or purulent drainage  Past Medical History  Diagnosis Date  . Myelofibrosis   . 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 Breast cancer     History   Social History  . Marital Status: Married    Spouse Name: N/A    Number of Children: 1  . Years of Education: N/A   Occupational History  . SECRETARY    Social History Main Topics  . Smoking status: Never Smoker   . Smokeless tobacco: Not on file  . Alcohol Use: No  . Drug Use: No  . Sexual Activity: Not on file   Other Topics Concern  . Not on file   Social History Narrative    Past Surgical History  Procedure Laterality Date  . Breast surgery    . Abdominal hysterectomy      Family History  Problem Relation Age of Onset  . Breast cancer Maternal Aunt     dx in her 2s  . Lung cancer Maternal Grandmother     dx in her 54x - smoker  . Lung cancer Maternal Aunt     dx in her 24s - smoker  . Leukemia Cousin 4    maternal first cousin - child of aunt  with breast cancer  . Breast cancer Cousin     dx in her 98s - daughter of aunt with lung cancer  . Colon cancer Cousin     paternal first cousin dx in his 41s  . Brain cancer Cousin 21    paternal cousin  . Breast cancer Other     MGM's sister dx in her 68s  . Breast cancer Other     mother's maternal first cousin dx in her 58s - BRCA neg  . Prostate cancer Other     MGM's brother    Allergies  Allergen Reactions  . Clarithromycin Rash    Current Outpatient Prescriptions on File Prior to Visit  Medication Sig Dispense Refill  . ALPRAZolam (XANAX) 0.5 MG tablet Take 0.5 mg by mouth 3 (three) times daily.    Marland Kitchen zolpidem (AMBIEN) 10 MG tablet Take 10 mg by mouth at bedtime as needed.      No current facility-administered medications on file prior to visit.    BP 156/90 mmHg  Pulse 104  Temp(Src) 98.3 F (36.8 C) (Oral)  Resp 18  Ht 5' 9"  (1.753 m)  Wt 164 lb (74.39 kg)  BMI 24.21 kg/m2  SpO2 98%  Review of Systems  HENT: Positive for nosebleeds and sinus pressure. Negative for congestion, dental problem, hearing loss, rhinorrhea, sore throat and tinnitus.   Eyes: Negative for pain, discharge and visual disturbance.  Respiratory: Negative for cough and shortness of breath.   Cardiovascular: Negative for chest pain, palpitations and leg swelling.  Gastrointestinal: Negative for nausea, vomiting, abdominal pain, diarrhea, constipation, blood in stool and abdominal distention.  Genitourinary: Negative for dysuria, urgency, frequency, hematuria, flank pain, vaginal bleeding, vaginal discharge, difficulty urinating, vaginal pain and pelvic pain.  Musculoskeletal: Negative for joint swelling, arthralgias and gait problem.  Skin: Negative for rash.  Neurological: Positive for weakness and headaches. Negative for dizziness, syncope, speech difficulty and numbness.  Hematological: Negative for adenopathy.  Psychiatric/Behavioral: Negative for behavioral problems, dysphoric  mood and agitation. The patient is not nervous/anxious.        Objective:   Physical Exam  Constitutional: She is oriented to person, place, and time. She appears well-developed and well-nourished.  Blood pressure 164/84  HENT:  Head: Normocephalic.  Right Ear: External ear normal.  Left Ear: External ear normal.  Mouth/Throat: Oropharynx is clear and moist.  Some dry blood in the right nares  Eyes: Conjunctivae and EOM are normal. Pupils are equal, round, and reactive to light.  Neck: Normal range of motion. Neck supple. No thyromegaly present.  Cardiovascular: Normal rate, regular rhythm, normal heart sounds and intact distal pulses.   Pulmonary/Chest: Effort normal and breath sounds normal.  Abdominal: Soft. Bowel sounds are normal. She exhibits no mass. There is no tenderness.  Marked splenic enlargement  Musculoskeletal: Normal range of motion.  Lymphadenopathy:    She has no cervical adenopathy.  Neurological: She is alert and oriented to person, place, and time.  Skin: Skin is warm and dry. No rash noted.  Psychiatric: She has a normal mood and affect. Her behavior is normal.          Assessment & Plan:   Hypertension.  Will resume hydrochlorothiazide 25 mg daily Recent epistaxis Headaches.  Will continue symptomatic treatment Myelofibrosis.  Follow-up for ENT  Home blood pressure readings recommended Low-salt diet.  Encouraged Recheck 3 months or as needed

## 2014-10-10 NOTE — Patient Instructions (Signed)
Limit your sodium (Salt) intake  Please check your blood pressure on a regular basis.  If it is consistently greater than 150/90, please make an office appointment.  Return in 3 months for follow-up  

## 2014-10-16 ENCOUNTER — Other Ambulatory Visit: Payer: Self-pay | Admitting: Oncology

## 2014-10-22 ENCOUNTER — Other Ambulatory Visit: Payer: Self-pay | Admitting: Internal Medicine

## 2014-11-27 ENCOUNTER — Telehealth: Payer: Self-pay | Admitting: *Deleted

## 2014-11-27 NOTE — Telephone Encounter (Signed)
Received fax from [facility] that they have been trying to reach patient to arrange delivery of her Jakafi 15 mg. Called her and confirmed she never started this treatment-was trying the [facility] for now. However, she stopped the Hydrea 500 mg qod in November due to nausea and nosebleeds. Reports she was told by ER physician to stop the Hydrea until the nosebleeds resolve. She is willing to resume the Hydrea at a lower dose if possible. Feels 500 mg qod was too much-asking if she can break the pill in half and take it qod? Will follow up with pharmacy regarding breaking in pill in half ( do not do this for now ). She agrees to reschedule the missed appointment she had. Can come any day around 0900 if possible. Will make her request known to MD.

## 2014-11-29 ENCOUNTER — Telehealth: Payer: Self-pay | Admitting: *Deleted

## 2014-11-29 DIAGNOSIS — D7581 Myelofibrosis: Secondary | ICD-10-CM

## 2014-11-29 NOTE — Telephone Encounter (Signed)
Per Dr. Benay Spice: Continue Hydrea 500 mg QOD. Epistaxis is not due to Hydrea. Platelets are not working properly. Lab/office visit in one week. Pt voiced understanding. Order sent to schedulers for appt.

## 2014-11-30 ENCOUNTER — Telehealth: Payer: Self-pay | Admitting: Oncology

## 2014-11-30 NOTE — Telephone Encounter (Signed)
S/w pt confirming labs/ov per 01/07 POF..... KJ

## 2014-12-07 ENCOUNTER — Telehealth: Payer: Self-pay | Admitting: Oncology

## 2014-12-07 ENCOUNTER — Ambulatory Visit (HOSPITAL_BASED_OUTPATIENT_CLINIC_OR_DEPARTMENT_OTHER): Payer: BLUE CROSS/BLUE SHIELD | Admitting: Oncology

## 2014-12-07 ENCOUNTER — Other Ambulatory Visit: Payer: Self-pay | Admitting: *Deleted

## 2014-12-07 ENCOUNTER — Other Ambulatory Visit (HOSPITAL_BASED_OUTPATIENT_CLINIC_OR_DEPARTMENT_OTHER): Payer: BLUE CROSS/BLUE SHIELD

## 2014-12-07 VITALS — BP 136/75 | HR 81 | Temp 98.2°F | Resp 18 | Ht 69.0 in | Wt 168.1 lb

## 2014-12-07 DIAGNOSIS — D7581 Myelofibrosis: Secondary | ICD-10-CM

## 2014-12-07 LAB — COMPREHENSIVE METABOLIC PANEL (CC13)
ALT: 18 U/L (ref 0–55)
AST: 18 U/L (ref 5–34)
Albumin: 4.1 g/dL (ref 3.5–5.0)
Alkaline Phosphatase: 68 U/L (ref 40–150)
Anion Gap: 8 mEq/L (ref 3–11)
BUN: 12 mg/dL (ref 7.0–26.0)
CO2: 25 mEq/L (ref 22–29)
Calcium: 9.4 mg/dL (ref 8.4–10.4)
Chloride: 107 mEq/L (ref 98–109)
Creatinine: 0.8 mg/dL (ref 0.6–1.1)
EGFR: 83 mL/min/{1.73_m2} — AB (ref >90)
Glucose: 85 mg/dl (ref 70–140)
Potassium: 3.9 mEq/L (ref 3.5–5.1)
Sodium: 139 mEq/L (ref 136–145)
TOTAL PROTEIN: 6.8 g/dL (ref 6.4–8.3)
Total Bilirubin: 0.48 mg/dL (ref 0.20–1.20)

## 2014-12-07 LAB — CBC WITH DIFFERENTIAL/PLATELET
BASO%: 0.9 % (ref 0.0–2.0)
Basophils Absolute: 0.1 10*3/uL (ref 0.0–0.1)
EOS%: 0.5 % (ref 0.0–7.0)
Eosinophils Absolute: 0 10*3/uL (ref 0.0–0.5)
HCT: 33.4 % — ABNORMAL LOW (ref 34.8–46.6)
HGB: 10.5 g/dL — ABNORMAL LOW (ref 11.6–15.9)
LYMPH%: 20.7 % (ref 14.0–49.7)
MCH: 26.9 pg (ref 25.1–34.0)
MCHC: 31.4 g/dL — ABNORMAL LOW (ref 31.5–36.0)
MCV: 85.4 fL (ref 79.5–101.0)
MONO#: 0.5 10*3/uL (ref 0.1–0.9)
MONO%: 7.8 % (ref 0.0–14.0)
NEUT#: 4.6 10*3/uL (ref 1.5–6.5)
NEUT%: 70.1 % (ref 38.4–76.8)
Platelets: 228 10*3/uL (ref 145–400)
RBC: 3.91 10*6/uL (ref 3.70–5.45)
RDW: 17.5 % — ABNORMAL HIGH (ref 11.2–14.5)
WBC: 6.6 10*3/uL (ref 3.9–10.3)
lymph#: 1.4 10*3/uL (ref 0.9–3.3)

## 2014-12-07 LAB — TECHNOLOGIST REVIEW: Technologist Review: 1

## 2014-12-07 MED ORDER — HYDROCODONE-ACETAMINOPHEN 5-325 MG PO TABS: 1.0000 | ORAL_TABLET | ORAL | Status: DC | PRN

## 2014-12-07 MED ORDER — HYDROXYUREA 500 MG PO CAPS: 500.0000 mg | ORAL_CAPSULE | Freq: Every day | ORAL | Status: DC

## 2014-12-07 NOTE — Telephone Encounter (Signed)
gv and printeda ppt sched and avs for pt for Jan and Feb. °

## 2014-12-07 NOTE — Progress Notes (Signed)
  [place] OFFICE PROGRESS NOTE   Diagnosis: Myelofibrosis  INTERVAL HISTORY:   She was started on hydroxyurea in October. She reports taking the hydroxyurea for approximately 1 month. She noted nausea several weeks into the course of hydroxyurea. She discontinued hydroxyurea which he developed a nosebleed 10/02/2014 that required packing in the emergency room followed by a "cautery procedure "by ENT. She discontinued hydroxyurea because she felt this could be contributing to bleeding.  She reports easy bruising. No further nose bleeding. Hydrocodone helps the bone pain. She continues to have sweats and aching in the bones.  Objective:  Vital signs in last 24 hours:  Blood pressure 136/75, pulse 81, temperature 98.2 F (36.8 C), temperature source Oral, resp. rate 18, height $RemoveBe'5\' 9"'JZvdleiHt$  (1.753 m), weight 168 lb 1.6 oz (76.25 kg), SpO2 100 %.    HEENT: No thrush or ulcers Resp: Clear bilaterally Cardio: Regular rate and rhythm GI: The spleen is palpable in the left abdomen extending to the umbilicus and slightly across the midline Vascular: No leg edema   Lab Results:  Lab Results  Component Value Date   WBC 13.0* 10/02/2014   HGB 11.5* 10/02/2014   HCT 35.7* 10/02/2014   MCV 88.1 10/02/2014   PLT 313 10/02/2014   NEUTROABS 8.9* 10/02/2014   Medications: I have reviewed the patient's current medications.  Assessment/Plan: 1. Stage II left-sided breast cancer diagnosed in February 2000. Screening mammogram 10/05/2011 with suggestion of a subcentimeter oval mass on the left superiorly and no suspicious masses, calcifications or architectural distortion in the right breast. She underwent biopsy of the "asymmetric density of concern in the superior aspect of the right breast" on 10/13/2011. Pathology showed a fibroadenoma.  2. 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  3. 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 4. Bone pain, likely a  rheumatic manifestation of myelofibrosis. 5. History of intermittent low-grade fever and "night sweats," likely systemic manifestations of myelofibrosis. 6. History of Nose bleeding-likely related to myelofibrosis with dysfunctional platelets 7. 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  8. Elevated creatinine July 2015-normal 08/06/2014, potentially related to blood pressure medication    Disposition:  She is stable from a hematologic standpoint. I explained the nose bleeding was unlikely related to hydroxyurea, but most likely secondary to dysfunctional platelets. I recommended she resume hydroxyurea at a dose of 500 mg daily in an effort to improve the splenomegaly and systemic symptoms related to myelofibrosis. She will return for a CBC in 2 weeks and an office visit in 4 weeks.  She will proceed with an attempt to obtain insurance coverage for Ruxolitinib if the hydroxyurea is not helpful.  Betsy Coder, MD  12/07/2014  8:52 AM

## 2014-12-21 ENCOUNTER — Telehealth: Payer: Self-pay | Admitting: *Deleted

## 2014-12-21 ENCOUNTER — Other Ambulatory Visit (HOSPITAL_BASED_OUTPATIENT_CLINIC_OR_DEPARTMENT_OTHER): Payer: BLUE CROSS/BLUE SHIELD

## 2014-12-21 DIAGNOSIS — D7581 Myelofibrosis: Secondary | ICD-10-CM

## 2014-12-21 LAB — CBC WITH DIFFERENTIAL/PLATELET
BASO%: 1 % (ref 0.0–2.0)
Basophils Absolute: 0 10*3/uL (ref 0.0–0.1)
EOS ABS: 0 10*3/uL (ref 0.0–0.5)
EOS%: 0.6 % (ref 0.0–7.0)
HCT: 29.9 % — ABNORMAL LOW (ref 34.8–46.6)
HGB: 9.6 g/dL — ABNORMAL LOW (ref 11.6–15.9)
LYMPH%: 20.6 % (ref 14.0–49.7)
MCH: 27.4 pg (ref 25.1–34.0)
MCHC: 32.1 g/dL (ref 31.5–36.0)
MCV: 85.4 fL (ref 79.5–101.0)
MONO#: 0.3 10*3/uL (ref 0.1–0.9)
MONO%: 6.4 % (ref 0.0–14.0)
NEUT#: 2.9 10*3/uL (ref 1.5–6.5)
NEUT%: 71.4 % (ref 38.4–76.8)
Platelets: 143 10*3/uL — ABNORMAL LOW (ref 145–400)
RBC: 3.5 10*6/uL — ABNORMAL LOW (ref 3.70–5.45)
RDW: 17.8 % — ABNORMAL HIGH (ref 11.2–14.5)
WBC: 4 10*3/uL (ref 3.9–10.3)
lymph#: 0.8 10*3/uL — ABNORMAL LOW (ref 0.9–3.3)

## 2014-12-21 LAB — TECHNOLOGIST REVIEW

## 2014-12-21 LAB — LACTATE DEHYDROGENASE (CC13): LDH: 548 U/L — ABNORMAL HIGH (ref 125–245)

## 2014-12-21 NOTE — Telephone Encounter (Signed)
Called pt, informed her counts look OK. Continue Hydrea and follow up as scheduled- per Dr. Benay Spice. She voiced understanding.

## 2014-12-21 NOTE — Telephone Encounter (Signed)
-----   Message from Ladell Pier, MD sent at 12/21/2014  1:10 PM EST ----- Please call patient, counts ok, continue hydrea, f/u as scheduled

## 2015-01-04 ENCOUNTER — Telehealth: Payer: Self-pay | Admitting: Oncology

## 2015-01-04 ENCOUNTER — Other Ambulatory Visit (HOSPITAL_BASED_OUTPATIENT_CLINIC_OR_DEPARTMENT_OTHER): Payer: BLUE CROSS/BLUE SHIELD

## 2015-01-04 ENCOUNTER — Other Ambulatory Visit: Payer: BLUE CROSS/BLUE SHIELD

## 2015-01-04 ENCOUNTER — Ambulatory Visit (HOSPITAL_BASED_OUTPATIENT_CLINIC_OR_DEPARTMENT_OTHER): Payer: BLUE CROSS/BLUE SHIELD | Admitting: Oncology

## 2015-01-04 ENCOUNTER — Ambulatory Visit: Payer: BLUE CROSS/BLUE SHIELD | Admitting: Nurse Practitioner

## 2015-01-04 VITALS — BP 139/81 | HR 79 | Temp 98.0°F | Resp 18 | Ht 69.0 in | Wt 169.4 lb

## 2015-01-04 DIAGNOSIS — Z853 Personal history of malignant neoplasm of breast: Secondary | ICD-10-CM

## 2015-01-04 DIAGNOSIS — D7581 Myelofibrosis: Secondary | ICD-10-CM

## 2015-01-04 DIAGNOSIS — M898X9 Other specified disorders of bone, unspecified site: Secondary | ICD-10-CM

## 2015-01-04 LAB — MANUAL DIFFERENTIAL
ALC: 0.6 10*3/uL — ABNORMAL LOW (ref 0.9–3.3)
ANC (CHCC manual diff): 3.6 10*3/uL (ref 1.5–6.5)
BASOPHIL: 2 % (ref 0–2)
Band Neutrophils: 0 % (ref 0–10)
Blasts: 2 % — ABNORMAL HIGH (ref 0–0)
EOS: 1 % (ref 0–7)
LYMPH: 13 % — ABNORMAL LOW (ref 14–49)
MONO: 10 % (ref 0–14)
Metamyelocytes: 3 % — ABNORMAL HIGH (ref 0–0)
Myelocytes: 4 % — ABNORMAL HIGH (ref 0–0)
NRBC: 1 % — AB (ref 0–0)
Other Cell: 0 % (ref 0–0)
PLT EST: ADEQUATE
PROMYELO: 0 % (ref 0–0)
SEG: 65 % (ref 38–77)
Variant Lymph: 0 % (ref 0–0)

## 2015-01-04 LAB — CBC WITH DIFFERENTIAL/PLATELET
HCT: 34.1 % — ABNORMAL LOW (ref 34.8–46.6)
HGB: 10.9 g/dL — ABNORMAL LOW (ref 11.6–15.9)
MCH: 27.9 pg (ref 25.1–34.0)
MCHC: 32 g/dL (ref 31.5–36.0)
MCV: 87.4 fL (ref 79.5–101.0)
PLATELETS: 203 10*3/uL (ref 145–400)
RBC: 3.9 10*6/uL (ref 3.70–5.45)
RDW: 20.2 % — ABNORMAL HIGH (ref 11.2–14.5)
WBC: 5 10*3/uL (ref 3.9–10.3)

## 2015-01-04 MED ORDER — HYDROXYUREA 500 MG PO CAPS: 500.0000 mg | ORAL_CAPSULE | Freq: Every day | ORAL | Status: DC

## 2015-01-04 MED ORDER — IBUPROFEN 800 MG PO TABS: 800.0000 mg | ORAL_TABLET | Freq: Three times a day (TID) | ORAL | Status: DC | PRN

## 2015-01-04 MED ORDER — HYDROCODONE-ACETAMINOPHEN 5-325 MG PO TABS: 1.0000 | ORAL_TABLET | ORAL | Status: DC | PRN

## 2015-01-04 NOTE — Progress Notes (Signed)
  [place] OFFICE PROGRESS NOTE   Diagnosis: Myelofibrosis  INTERVAL HISTORY:   Ms. Diamond returns as scheduled. She continues hydroxyurea daily. Mild intermittent nausea. No bleeding. She continues to have bone pain and night sweats. She takes 1-2 hydrocodone tablets per day for relief of pain in addition to ibuprofen.  Objective:  Vital signs in last 24 hours:  Blood pressure 139/81, pulse 79, temperature 98 F (36.7 C), temperature source Oral, resp. rate 18, height $RemoveBe'5\' 9"'AiUcisRaU$  (1.753 m), weight 169 lb 6.4 oz (76.839 kg).    HEENT: No thrush or ulcers Resp: Lungs clear bilaterally Cardio: Regular rate and rhythm GI: No hepatomegaly, the spleen is palpable in the left abdomen extending 1 finger above the umbilicus and 1 finger to the right of the midline. Vascular: No leg edema   Lab Results:  Lab Results  Component Value Date   WBC 5.0 01/04/2015   HGB 10.9* 01/04/2015   HCT 34.1* 01/04/2015   MCV 87.4 01/04/2015   PLT 203 01/04/2015   NEUTROABS 2.9 12/21/2014     Medications: I have reviewed the patient's current medications.  Assessment/Plan: 1. Stage II left-sided breast cancer diagnosed in February 2000. Screening mammogram 10/05/2011 with suggestion of a subcentimeter oval mass on the left superiorly and no suspicious masses, calcifications or architectural distortion in the right breast. She underwent biopsy of the "asymmetric density of concern in the superior aspect of the right breast" on 10/13/2011. Pathology showed a fibroadenoma.  2. 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  3. 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  Hydrea started at a dose of 500 mg daily on 12/07/2014 4. Bone pain, likely a rheumatic manifestation of myelofibrosis. 5. History of intermittent low-grade fever and "night sweats," likely systemic manifestations of myelofibrosis. 6. History of Nose bleeding-likely related to myelofibrosis  with dysfunctional platelets 7. 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  8. Elevated creatinine July 2015-normal 08/06/2014, potentially related to blood pressure medication    Disposition:  She appears stable. There has been no clear benefit from the hydroxyurea to date. We discussed switching to Ruxolitinib. She would like to continue hydroxyurea for now. She will return for a lab visit in 3 weeks and an office visit in 6 weeks. We will consider increasing the hydroxyurea dose if she is no better when she returns next month.      Betsy Coder, MD  01/04/2015  12:28 PM

## 2015-01-04 NOTE — Telephone Encounter (Signed)
Gave avs & calendar for March. °

## 2015-01-10 ENCOUNTER — Ambulatory Visit: Payer: BLUE CROSS/BLUE SHIELD | Admitting: Internal Medicine

## 2015-01-25 ENCOUNTER — Telehealth: Payer: Self-pay | Admitting: *Deleted

## 2015-01-25 ENCOUNTER — Other Ambulatory Visit (HOSPITAL_BASED_OUTPATIENT_CLINIC_OR_DEPARTMENT_OTHER): Payer: BLUE CROSS/BLUE SHIELD

## 2015-01-25 DIAGNOSIS — D7581 Myelofibrosis: Secondary | ICD-10-CM

## 2015-01-25 LAB — CBC WITH DIFFERENTIAL/PLATELET
BASO%: 0.5 % (ref 0.0–2.0)
BASOS ABS: 0 10*3/uL (ref 0.0–0.1)
EOS ABS: 0 10*3/uL (ref 0.0–0.5)
EOS%: 0.4 % (ref 0.0–7.0)
HCT: 31.7 % — ABNORMAL LOW (ref 34.8–46.6)
HGB: 10.2 g/dL — ABNORMAL LOW (ref 11.6–15.9)
LYMPH%: 17.9 % (ref 14.0–49.7)
MCH: 28.6 pg (ref 25.1–34.0)
MCHC: 32.2 g/dL (ref 31.5–36.0)
MCV: 89.1 fL (ref 79.5–101.0)
MONO#: 0.5 10*3/uL (ref 0.1–0.9)
MONO%: 8 % (ref 0.0–14.0)
NEUT%: 73.2 % (ref 38.4–76.8)
NEUTROS ABS: 4.2 10*3/uL (ref 1.5–6.5)
PLATELETS: 185 10*3/uL (ref 145–400)
RBC: 3.56 10*6/uL — AB (ref 3.70–5.45)
RDW: 22.7 % — ABNORMAL HIGH (ref 11.2–14.5)
WBC: 5.8 10*3/uL (ref 3.9–10.3)
lymph#: 1 10*3/uL (ref 0.9–3.3)

## 2015-01-25 LAB — LACTATE DEHYDROGENASE (CC13): LDH: 551 U/L — AB (ref 125–245)

## 2015-01-25 LAB — COMPREHENSIVE METABOLIC PANEL (CC13)
ALBUMIN: 4 g/dL (ref 3.5–5.0)
ALK PHOS: 63 U/L (ref 40–150)
ALT: 16 U/L (ref 0–55)
AST: 16 U/L (ref 5–34)
Anion Gap: 10 mEq/L (ref 3–11)
BILIRUBIN TOTAL: 0.43 mg/dL (ref 0.20–1.20)
BUN: 15.3 mg/dL (ref 7.0–26.0)
CALCIUM: 9.4 mg/dL (ref 8.4–10.4)
CO2: 22 mEq/L (ref 22–29)
Chloride: 107 mEq/L (ref 98–109)
Creatinine: 0.8 mg/dL (ref 0.6–1.1)
EGFR: 82 mL/min/{1.73_m2} — ABNORMAL LOW (ref >90)
Glucose: 86 mg/dl (ref 70–140)
Potassium: 4.4 mEq/L (ref 3.5–5.1)
SODIUM: 140 meq/L (ref 136–145)
TOTAL PROTEIN: 6.7 g/dL (ref 6.4–8.3)

## 2015-01-25 LAB — TECHNOLOGIST REVIEW

## 2015-01-25 NOTE — Telephone Encounter (Signed)
Encounter opened in error

## 2015-01-25 NOTE — Telephone Encounter (Signed)
-----   Message from Ladell Pier, MD sent at 01/25/2015  2:22 PM EST ----- Please call patient, cbc is stable, f/u as scheduled

## 2015-01-25 NOTE — Telephone Encounter (Signed)
Pt returned call, informed her CBC is stable and to follow up as scheduled. She voiced understanding.

## 2015-01-31 ENCOUNTER — Other Ambulatory Visit: Payer: Self-pay | Admitting: Oncology

## 2015-02-05 ENCOUNTER — Other Ambulatory Visit: Payer: Self-pay

## 2015-02-05 ENCOUNTER — Telehealth: Payer: Self-pay

## 2015-02-05 DIAGNOSIS — D7581 Myelofibrosis: Secondary | ICD-10-CM

## 2015-02-05 MED ORDER — HYDROXYUREA 500 MG PO CAPS: 500.0000 mg | ORAL_CAPSULE | Freq: Every day | ORAL | Status: DC

## 2015-02-05 NOTE — Telephone Encounter (Signed)
lvm with [name], [facility] is new to pt d/t change of insurance. I did e-scribe hydrea to [facility]. If they will need any additional information from [place], [name] should call them. The pt was called with the information that [facility] would be handling her hydrea.

## 2015-02-05 NOTE — Telephone Encounter (Signed)
S/w pt that we received a rejection from [facility] for her hydrea. Asked pt if it was OK to send Rx to [facility] for refills and she said it was OK. I did explain that she would have to pick up the Rx rather than getting it shipped to her house.

## 2015-02-14 ENCOUNTER — Telehealth: Payer: Self-pay | Admitting: *Deleted

## 2015-02-14 NOTE — Telephone Encounter (Signed)
PT.'S HOME AND MOBILE NUMBERS GIVEN.

## 2015-02-15 ENCOUNTER — Ambulatory Visit (HOSPITAL_BASED_OUTPATIENT_CLINIC_OR_DEPARTMENT_OTHER): Payer: BLUE CROSS/BLUE SHIELD | Admitting: Oncology

## 2015-02-15 ENCOUNTER — Other Ambulatory Visit (HOSPITAL_BASED_OUTPATIENT_CLINIC_OR_DEPARTMENT_OTHER): Payer: BLUE CROSS/BLUE SHIELD

## 2015-02-15 ENCOUNTER — Telehealth: Payer: Self-pay | Admitting: Oncology

## 2015-02-15 VITALS — BP 150/79 | HR 79 | Temp 98.6°F | Resp 18 | Ht 69.0 in | Wt 174.0 lb

## 2015-02-15 DIAGNOSIS — M898X9 Other specified disorders of bone, unspecified site: Secondary | ICD-10-CM

## 2015-02-15 DIAGNOSIS — D7581 Myelofibrosis: Secondary | ICD-10-CM | POA: Diagnosis not present

## 2015-02-15 DIAGNOSIS — R161 Splenomegaly, not elsewhere classified: Secondary | ICD-10-CM

## 2015-02-15 LAB — CBC WITH DIFFERENTIAL/PLATELET
BASO%: 1 % (ref 0.0–2.0)
Basophils Absolute: 0.1 10*3/uL (ref 0.0–0.1)
EOS ABS: 0 10*3/uL (ref 0.0–0.5)
EOS%: 0.4 % (ref 0.0–7.0)
HCT: 32.5 % — ABNORMAL LOW (ref 34.8–46.6)
HGB: 10.5 g/dL — ABNORMAL LOW (ref 11.6–15.9)
LYMPH#: 1.4 10*3/uL (ref 0.9–3.3)
LYMPH%: 13.1 % — AB (ref 14.0–49.7)
MCH: 28.8 pg (ref 25.1–34.0)
MCHC: 32.3 g/dL (ref 31.5–36.0)
MCV: 89.3 fL (ref 79.5–101.0)
MONO#: 0.8 10*3/uL (ref 0.1–0.9)
MONO%: 7.9 % (ref 0.0–14.0)
NEUT%: 77.6 % — ABNORMAL HIGH (ref 38.4–76.8)
NEUTROS ABS: 8 10*3/uL — AB (ref 1.5–6.5)
PLATELETS: 229 10*3/uL (ref 145–400)
RBC: 3.64 10*6/uL — AB (ref 3.70–5.45)
RDW: 18.8 % — AB (ref 11.2–14.5)
WBC: 10.3 10*3/uL (ref 3.9–10.3)
nRBC: 1 % — ABNORMAL HIGH (ref 0–0)

## 2015-02-15 LAB — LACTATE DEHYDROGENASE (CC13): LDH: 688 U/L — AB (ref 125–245)

## 2015-02-15 LAB — TECHNOLOGIST REVIEW

## 2015-02-15 MED ORDER — HYDROCODONE-ACETAMINOPHEN 5-325 MG PO TABS: 1.0000 | ORAL_TABLET | ORAL | Status: DC | PRN

## 2015-02-15 NOTE — Progress Notes (Signed)
  [place] OFFICE PROGRESS NOTE   Diagnosis: Myelofibrosis  INTERVAL HISTORY:   Mr. Labella returns as scheduled. She continues daily hydroxyurea. She continues to have diffuse bone pain and takes alternating Tylenol and hydrocodone. Mild intermittent nausea. She continues to have night sweats. No new complaint.  Objective:  Vital signs in last 24 hours:  Blood pressure 150/79, pulse 79, temperature 98.6 F (37 C), temperature source Oral, resp. rate 18, height $RemoveBe'5\' 9"'ibsQbdDfe$  (1.753 m), weight 174 lb (78.926 kg), SpO2 100 %.    HEENT: No thrush or ulcers Resp: Lungs clear bilaterally Cardio: Regular rate and rhythm GI: No hepatomegaly, the spleen is palpable to the level of the umbilicus extending to the midline Vascular: No leg edema   Lab Results:  Lab Results  Component Value Date   WBC 10.3 02/15/2015   HGB 10.5* 02/15/2015   HCT 32.5* 02/15/2015   MCV 89.3 02/15/2015   PLT 229 02/15/2015   NEUTROABS 8.0* 02/15/2015    Medications: I have reviewed the patient's current medications.  Assessment/Plan: 1. Stage II left-sided breast cancer diagnosed in February 2000. Screening mammogram 10/05/2011 with suggestion of a subcentimeter oval mass on the left superiorly and no suspicious masses, calcifications or architectural distortion in the right breast. She underwent biopsy of the "asymmetric density of concern in the superior aspect of the right breast" on 10/13/2011. Pathology showed a fibroadenoma.  2. 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  3. 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  Hydrea started at a dose of 500 mg daily on 12/07/2014 4. Bone pain, likely a rheumatic manifestation of myelofibrosis. 5. History of intermittent low-grade fever and "night sweats," likely systemic manifestations of myelofibrosis. 6. History of Nose bleeding-likely related to myelofibrosis with dysfunctional platelets 7. 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  8. Elevated creatinine July 2015-normal 08/06/2014, potentially related to blood pressure medication    Disposition:  Ms. Mink appears unchanged. There is been no apparent benefit from the hydroxyurea. I discussed increasing the hydroxyurea dose or switching to [facility]. She would like to continue the current hydroxyurea dose for now. She will return for an office and lab visit in 2 months.  Betsy Coder, MD  02/15/2015  12:22 PM

## 2015-02-15 NOTE — Telephone Encounter (Signed)
Pt confirmed labs/ov per 03/25 POF, gave pt AVS and Calendar.... KJ

## 2015-03-04 ENCOUNTER — Other Ambulatory Visit: Payer: Self-pay | Admitting: Oncology

## 2015-04-06 ENCOUNTER — Other Ambulatory Visit: Payer: Self-pay | Admitting: Oncology

## 2015-04-15 ENCOUNTER — Ambulatory Visit (HOSPITAL_BASED_OUTPATIENT_CLINIC_OR_DEPARTMENT_OTHER): Payer: BLUE CROSS/BLUE SHIELD | Admitting: Oncology

## 2015-04-15 ENCOUNTER — Other Ambulatory Visit (HOSPITAL_BASED_OUTPATIENT_CLINIC_OR_DEPARTMENT_OTHER): Payer: BLUE CROSS/BLUE SHIELD

## 2015-04-15 ENCOUNTER — Other Ambulatory Visit: Payer: Self-pay | Admitting: *Deleted

## 2015-04-15 ENCOUNTER — Telehealth: Payer: Self-pay | Admitting: Oncology

## 2015-04-15 VITALS — BP 137/72 | HR 76 | Temp 98.2°F | Resp 18 | Ht 69.0 in | Wt 174.0 lb

## 2015-04-15 DIAGNOSIS — D473 Essential (hemorrhagic) thrombocythemia: Secondary | ICD-10-CM | POA: Diagnosis not present

## 2015-04-15 DIAGNOSIS — Z853 Personal history of malignant neoplasm of breast: Secondary | ICD-10-CM

## 2015-04-15 DIAGNOSIS — D649 Anemia, unspecified: Secondary | ICD-10-CM | POA: Diagnosis not present

## 2015-04-15 DIAGNOSIS — M899 Disorder of bone, unspecified: Secondary | ICD-10-CM

## 2015-04-15 DIAGNOSIS — D7581 Myelofibrosis: Secondary | ICD-10-CM | POA: Diagnosis not present

## 2015-04-15 DIAGNOSIS — R161 Splenomegaly, not elsewhere classified: Secondary | ICD-10-CM | POA: Diagnosis not present

## 2015-04-15 LAB — MANUAL DIFFERENTIAL
ALC: 1.9 10*3/uL (ref 0.9–3.3)
ANC (CHCC MAN DIFF): 4.6 10*3/uL (ref 1.5–6.5)
Band Neutrophils: 20 % — ABNORMAL HIGH (ref 0–10)
Basophil: 1 % (ref 0–2)
Blasts: 2 % — ABNORMAL HIGH (ref 0–0)
EOS: 0 % (ref 0–7)
LYMPH: 27 % (ref 14–49)
METAMYELOCYTES PCT: 6 % — AB (ref 0–0)
MONO: 6 % (ref 0–14)
Myelocytes: 4 % — ABNORMAL HIGH (ref 0–0)
OTHER CELL: 0 % (ref 0–0)
PLT EST: ADEQUATE
PROMYELO: 0 % (ref 0–0)
SEG: 34 % — ABNORMAL LOW (ref 38–77)
VARIANT LYMPH: 0 % (ref 0–0)
nRBC: 2 % — ABNORMAL HIGH (ref 0–0)

## 2015-04-15 LAB — CBC WITH DIFFERENTIAL/PLATELET
HCT: 33.4 % — ABNORMAL LOW (ref 34.8–46.6)
HGB: 11 g/dL — ABNORMAL LOW (ref 11.6–15.9)
MCH: 28.4 pg (ref 25.1–34.0)
MCHC: 33 g/dL (ref 31.5–36.0)
MCV: 85.9 fL (ref 79.5–101.0)
Platelets: 241 10*3/uL (ref 145–400)
RBC: 3.88 10*6/uL (ref 3.70–5.45)
RDW: 16.2 % — ABNORMAL HIGH (ref 11.2–14.5)
WBC: 7.2 10*3/uL (ref 3.9–10.3)

## 2015-04-15 MED ORDER — HYDROCODONE-ACETAMINOPHEN 5-325 MG PO TABS: 1.0000 | ORAL_TABLET | ORAL | Status: DC | PRN

## 2015-04-15 NOTE — Telephone Encounter (Signed)
per pof to sch pt appt-gave pt copy of sch °

## 2015-04-15 NOTE — Progress Notes (Signed)
  [place] OFFICE PROGRESS NOTE   Diagnosis: Myelofibrosis  INTERVAL HISTORY:   [name] returns as scheduled. She reports running out of hydroxyurea approximate 3-1/2 weeks ago. She believes a hydroxyurea has partially helped the bone pain. She noted an increase in spleen related pain when she stopped the hydroxyurea. For the past several days she reports pain at the left iliac area. No trauma. No dysuria. She continues oxycodone and Vicodin for relief of bone pain.  Objective:  Vital signs in last 24 hours:  Blood pressure 137/72, pulse 76, temperature 98.2 F (36.8 C), temperature source Oral, resp. rate 18, height $RemoveBe'5\' 9"'GONFWPmXf$  (1.753 m), weight 174 lb (78.926 kg), SpO2 100 %.    HEENT: No thrush or ulcers Resp: Lungs clear bilaterally Cardio: Regular rate and rhythm GI: No hepatomegaly, the spleen is palpable throughout the left abdomen extending to the umbilicus and across the midline at the upper abdomen Vascular: No leg edema Musculoskeletal: Mild tenderness at the left posterior iliac, no rash or mass      Lab Results:  Lab Results  Component Value Date   WBC 7.2 04/15/2015   HGB 11.0* 04/15/2015   HCT 33.4* 04/15/2015   MCV 85.9 04/15/2015   PLT 241 04/15/2015   NEUTROABS 8.0* 02/15/2015    Medications: I have reviewed the patient's current medications.  Assessment/Plan: 1. Stage II left-sided breast cancer diagnosed in February 2000. Screening mammogram 10/05/2011 with suggestion of a subcentimeter oval mass on the left superiorly and no suspicious masses, calcifications or architectural distortion in the right breast. She underwent biopsy of the "asymmetric density of concern in the superior aspect of the right breast" on 10/13/2011. Pathology showed a fibroadenoma.  2. 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  3. 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  Hydrea started at a dose of 500 mg daily on  12/07/2014 4. Bone pain, likely a rheumatic manifestation of myelofibrosis. 5. History of intermittent low-grade fever and "night sweats," likely systemic manifestations of myelofibrosis. 6. History of Nose bleeding-likely related to myelofibrosis with dysfunctional platelets 7. 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  8. Elevated creatinine July 2015-normal 08/06/2014, potentially related to blood pressure medication   Disposition:  Her overall status appears unchanged. She would like to continue hydroxyurea at the current dose. She will return for a lab visit in one month and an office visit in 2 months. I recommend a change to the JAK inhibitor if the massive splenomegaly and pain persist despite taking hydroxyurea consistently. She would like to continue hydroxyurea until the next office visit. The left iliac pain is most likely related to a benign musculoskeletal condition. She will contact us if this progresses.  Angela Coder, MD  04/15/2015  12:20 PM

## 2015-05-13 ENCOUNTER — Other Ambulatory Visit: Payer: Self-pay | Admitting: Oncology

## 2015-06-04 ENCOUNTER — Other Ambulatory Visit: Payer: Self-pay | Admitting: *Deleted

## 2015-06-04 DIAGNOSIS — D7581 Myelofibrosis: Secondary | ICD-10-CM

## 2015-06-04 NOTE — Telephone Encounter (Signed)
ok 

## 2015-06-04 NOTE — Telephone Encounter (Signed)
PT. WOULD LIKE A REFILL FOR HYDROCODONE/ACETAMINOPHEN 5/325MG . SHE WOULD LIKE TO PICK THE PRESCRIPTION UP ON Thursday.

## 2015-06-05 MED ORDER — HYDROCODONE-ACETAMINOPHEN 5-325 MG PO TABS: 1.0000 | ORAL_TABLET | ORAL | Status: DC | PRN

## 2015-06-05 NOTE — Telephone Encounter (Signed)
Left message on voicemail informing pt that Rx is ready for pick up.

## 2015-06-05 NOTE — Addendum Note (Signed)
Addended by: Brien Few on: 06/05/2015 01:21 PM   Modules accepted: Orders

## 2015-06-13 ENCOUNTER — Other Ambulatory Visit: Payer: Self-pay | Admitting: Oncology

## 2015-06-17 ENCOUNTER — Ambulatory Visit: Payer: BLUE CROSS/BLUE SHIELD | Admitting: Oncology

## 2015-06-17 ENCOUNTER — Other Ambulatory Visit: Payer: BLUE CROSS/BLUE SHIELD

## 2015-07-01 ENCOUNTER — Ambulatory Visit (HOSPITAL_BASED_OUTPATIENT_CLINIC_OR_DEPARTMENT_OTHER): Payer: BLUE CROSS/BLUE SHIELD | Admitting: Oncology

## 2015-07-01 ENCOUNTER — Other Ambulatory Visit (HOSPITAL_BASED_OUTPATIENT_CLINIC_OR_DEPARTMENT_OTHER): Payer: BLUE CROSS/BLUE SHIELD

## 2015-07-01 ENCOUNTER — Other Ambulatory Visit: Payer: Self-pay | Admitting: *Deleted

## 2015-07-01 ENCOUNTER — Telehealth: Payer: Self-pay | Admitting: Oncology

## 2015-07-01 VITALS — BP 152/76 | HR 68 | Temp 98.4°F | Resp 18 | Ht 69.0 in | Wt 173.9 lb

## 2015-07-01 DIAGNOSIS — D7581 Myelofibrosis: Secondary | ICD-10-CM | POA: Diagnosis not present

## 2015-07-01 DIAGNOSIS — Z853 Personal history of malignant neoplasm of breast: Secondary | ICD-10-CM | POA: Diagnosis not present

## 2015-07-01 LAB — MANUAL DIFFERENTIAL
ALC: 1.1 10*3/uL (ref 0.9–3.3)
ANC (CHCC manual diff): 6 10*3/uL (ref 1.5–6.5)
BLASTS: 0 % (ref 0–0)
Band Neutrophils: 17 % — ABNORMAL HIGH (ref 0–10)
Basophil: 0 % (ref 0–2)
EOS%: 0 % (ref 0–7)
LYMPH: 14 % (ref 14–49)
MONO: 4 % (ref 0–14)
Metamyelocytes: 6 % — ABNORMAL HIGH (ref 0–0)
Myelocytes: 9 % — ABNORMAL HIGH (ref 0–0)
OTHER CELL: 0 % (ref 0–0)
PLT EST: ADEQUATE
PROMYELO: 2 % — AB (ref 0–0)
SEG: 48 % (ref 38–77)
Variant Lymph: 0 % (ref 0–0)
nRBC: 1 % — ABNORMAL HIGH (ref 0–0)

## 2015-07-01 LAB — CBC WITH DIFFERENTIAL/PLATELET
HCT: 32.9 % — ABNORMAL LOW (ref 34.8–46.6)
HGB: 10.7 g/dL — ABNORMAL LOW (ref 11.6–15.9)
MCH: 29 pg (ref 25.1–34.0)
MCHC: 32.7 g/dL (ref 31.5–36.0)
MCV: 88.7 fL (ref 79.5–101.0)
Platelets: 251 10*3/uL (ref 145–400)
RBC: 3.7 10*6/uL (ref 3.70–5.45)
RDW: 19.3 % — ABNORMAL HIGH (ref 11.2–14.5)
WBC: 7.5 10*3/uL (ref 3.9–10.3)

## 2015-07-01 LAB — LACTATE DEHYDROGENASE (CC13): LDH: 620 U/L — ABNORMAL HIGH (ref 125–245)

## 2015-07-01 MED ORDER — HYDROCODONE-ACETAMINOPHEN 5-325 MG PO TABS: 1.0000 | ORAL_TABLET | ORAL | Status: DC | PRN

## 2015-07-01 NOTE — Telephone Encounter (Signed)
Gave and printed appt sched and avs for pt for OCT and DEC

## 2015-07-01 NOTE — Progress Notes (Signed)
  [place] OFFICE PROGRESS NOTE   Diagnosis: Breast cancer, myelofibrosis  INTERVAL HISTORY:   Ms. [place] returns as scheduled. She continues hydroxyurea. She reports a recent episode of "mouth sores "that resolved. She has noted a decrease in night sweats and believes the spleen is smaller. She continues hydrocodone approximately twice daily and Advil 3 times per day for relief of bone pain.  Objective:  Vital signs in last 24 hours:  Blood pressure 152/76, pulse 68, temperature 98.4 F (36.9 C), temperature source Oral, resp. rate 18, height _0  (1.753 m), weight 173 lb 14.4 oz (78.881 kg), SpO2 100 %.    HEENT: No thrush or ulcers Lymphatics: No cervical, supraclavicular, or axillary nodes Resp: Lungs clear bilaterally Cardio: Regular rate and rhythm GI: No hepatomegaly, the spleen is palpable throughout the left abdomen extending to 1 finger above the umbilicus and 2 fingers to the right of midline Vascular: No leg edema Breasts: Right breast without mass, status post left mastectomy with an implant in place. No evidence for chest wall tumor recurrence.    Lab Results:  Lab Results  Component Value Date   WBC 7.5 07/01/2015   HGB 10.7* 07/01/2015   HCT 32.9* 07/01/2015   MCV 88.7 07/01/2015   PLT 251 07/01/2015   NEUTROABS 8.0* 02/15/2015     Medications: I have reviewed the patient's current medications.  Assessment/Plan: 1. Stage II left-sided breast cancer diagnosed in February 2000. Screening mammogram 10/05/2011 with suggestion of a subcentimeter oval mass on the left superiorly and no suspicious masses, calcifications or architectural distortion in the right breast. She underwent biopsy of the "asymmetric density of concern in the superior aspect of the right breast" on 10/13/2011. Pathology showed a fibroadenoma.  2. 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  3. 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  Hydrea started at a dose of 500 mg daily on 12/07/2014 4. Bone pain, likely a rheumatic manifestation of myelofibrosis. 5. History of intermittent low-grade fever and "night sweats," likely systemic manifestations of myelofibrosis. 6. History of Nose bleeding-likely related to myelofibrosis with dysfunctional platelets 7. 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  8. Elevated creatinine July 2015-normal 08/06/2014, potentially related to blood pressure medication     Disposition:  Ms. Harrell appears stable. She will continue hydroxyurea at current dose. She will return for a lab visit in 2 months and an office visit in 4 months. She does not wish to consider a Jak-2 inhibitor at present.   Betsy Coder, MD  07/01/2015  12:34 PM

## 2015-07-15 ENCOUNTER — Other Ambulatory Visit: Payer: Self-pay | Admitting: Oncology

## 2015-07-22 ENCOUNTER — Other Ambulatory Visit: Payer: Self-pay | Admitting: Obstetrics & Gynecology

## 2015-07-24 LAB — CYTOLOGY - PAP

## 2015-08-15 ENCOUNTER — Other Ambulatory Visit: Payer: Self-pay | Admitting: *Deleted

## 2015-08-15 ENCOUNTER — Telehealth: Payer: Self-pay | Admitting: *Deleted

## 2015-08-15 DIAGNOSIS — D7581 Myelofibrosis: Secondary | ICD-10-CM

## 2015-08-15 MED ORDER — HYDROCODONE-ACETAMINOPHEN 5-325 MG PO TABS: 1.0000 | ORAL_TABLET | ORAL | Status: DC | PRN

## 2015-08-15 NOTE — Telephone Encounter (Signed)
Notified pt that re-fill request is ready for p/u.  Pt verbalized understanding.

## 2015-08-15 NOTE — Telephone Encounter (Signed)
Patient left voicemail requesting refill for vicodin.  Forwarded to [facility].

## 2015-08-17 ENCOUNTER — Other Ambulatory Visit: Payer: Self-pay | Admitting: Oncology

## 2015-08-26 ENCOUNTER — Other Ambulatory Visit (HOSPITAL_BASED_OUTPATIENT_CLINIC_OR_DEPARTMENT_OTHER): Payer: BLUE CROSS/BLUE SHIELD

## 2015-08-26 DIAGNOSIS — D7581 Myelofibrosis: Secondary | ICD-10-CM | POA: Diagnosis not present

## 2015-08-26 LAB — CBC WITH DIFFERENTIAL/PLATELET
HCT: 31.8 % — ABNORMAL LOW (ref 34.8–46.6)
HEMOGLOBIN: 10.6 g/dL — AB (ref 11.6–15.9)
MCH: 29.3 pg (ref 25.1–34.0)
MCHC: 33.3 g/dL (ref 31.5–36.0)
MCV: 87.9 fL (ref 79.5–101.0)
Platelets: 247 10*3/uL (ref 145–400)
RBC: 3.62 10*6/uL — ABNORMAL LOW (ref 3.70–5.45)
RDW: 16.6 % — AB (ref 11.2–14.5)
WBC: 6.1 10*3/uL (ref 3.9–10.3)

## 2015-08-26 LAB — MANUAL DIFFERENTIAL
ALC: 1.1 10*3/uL (ref 0.9–3.3)
ANC (CHCC MAN DIFF): 4.8 10*3/uL (ref 1.5–6.5)
BAND NEUTROPHILS: 23 % — AB (ref 0–10)
LYMPH: 18 % (ref 14–49)
METAMYELOCYTES PCT: 6 % — AB (ref 0–0)
MONO: 3 % (ref 0–14)
MYELOCYTES: 6 % — AB (ref 0–0)
PLT EST: ADEQUATE
Platelet Morphology: NORMAL
SEG: 44 % (ref 38–77)
nRBC: 1 % — ABNORMAL HIGH (ref 0–0)

## 2015-08-27 ENCOUNTER — Telehealth: Payer: Self-pay | Admitting: *Deleted

## 2015-08-27 NOTE — Telephone Encounter (Signed)
Per Dr. Benay Spice; notified pt that CBC is stable, f/u as scheduled, stay on same hydrea.  Pt verbalized understanding and expressed appreciation for call.

## 2015-08-27 NOTE — Telephone Encounter (Signed)
-----   Message from Ladell Pier, MD sent at 08/26/2015  6:04 PM EDT ----- Please call patient, cbc is stable, f/u as scheduled, same hydrea

## 2015-09-26 ENCOUNTER — Other Ambulatory Visit: Payer: Self-pay | Admitting: *Deleted

## 2015-09-26 DIAGNOSIS — D7581 Myelofibrosis: Secondary | ICD-10-CM

## 2015-09-26 MED ORDER — HYDROCODONE-ACETAMINOPHEN 5-325 MG PO TABS: 1.0000 | ORAL_TABLET | ORAL | Status: DC | PRN

## 2015-09-26 NOTE — Telephone Encounter (Signed)
Message from pt requesting refill on Hydrocodone. Rx will be left in prescription book for pick up. Pt notified.

## 2015-10-15 ENCOUNTER — Other Ambulatory Visit: Payer: Self-pay | Admitting: Oncology

## 2015-10-28 ENCOUNTER — Other Ambulatory Visit: Payer: Self-pay | Admitting: *Deleted

## 2015-10-28 ENCOUNTER — Telehealth: Payer: Self-pay | Admitting: Oncology

## 2015-10-28 ENCOUNTER — Other Ambulatory Visit (HOSPITAL_BASED_OUTPATIENT_CLINIC_OR_DEPARTMENT_OTHER): Payer: BLUE CROSS/BLUE SHIELD

## 2015-10-28 ENCOUNTER — Ambulatory Visit (HOSPITAL_BASED_OUTPATIENT_CLINIC_OR_DEPARTMENT_OTHER): Payer: BLUE CROSS/BLUE SHIELD | Admitting: Oncology

## 2015-10-28 VITALS — BP 154/77 | HR 77 | Temp 98.6°F | Resp 18 | Ht 69.0 in | Wt 170.5 lb

## 2015-10-28 DIAGNOSIS — M898X9 Other specified disorders of bone, unspecified site: Secondary | ICD-10-CM | POA: Diagnosis not present

## 2015-10-28 DIAGNOSIS — R161 Splenomegaly, not elsewhere classified: Secondary | ICD-10-CM

## 2015-10-28 DIAGNOSIS — D7581 Myelofibrosis: Secondary | ICD-10-CM

## 2015-10-28 DIAGNOSIS — Z853 Personal history of malignant neoplasm of breast: Secondary | ICD-10-CM

## 2015-10-28 LAB — CBC WITH DIFFERENTIAL/PLATELET
BASO%: 0.6 % (ref 0.0–2.0)
BASOS ABS: 0.1 10*3/uL (ref 0.0–0.1)
EOS ABS: 0 10*3/uL (ref 0.0–0.5)
EOS%: 0.3 % (ref 0.0–7.0)
HCT: 34.4 % — ABNORMAL LOW (ref 34.8–46.6)
HEMOGLOBIN: 11.4 g/dL — AB (ref 11.6–15.9)
LYMPH%: 15 % (ref 14.0–49.7)
MCH: 28.4 pg (ref 25.1–34.0)
MCHC: 33.1 g/dL (ref 31.5–36.0)
MCV: 85.9 fL (ref 79.5–101.0)
MONO#: 0.6 10*3/uL (ref 0.1–0.9)
MONO%: 7.4 % (ref 0.0–14.0)
NEUT#: 6.6 10*3/uL — ABNORMAL HIGH (ref 1.5–6.5)
NEUT%: 76.7 % (ref 38.4–76.8)
Platelets: 257 10*3/uL (ref 145–400)
RBC: 4 10*6/uL (ref 3.70–5.45)
RDW: 16.4 % — AB (ref 11.2–14.5)
WBC: 8.6 10*3/uL (ref 3.9–10.3)
lymph#: 1.3 10*3/uL (ref 0.9–3.3)

## 2015-10-28 LAB — MORPHOLOGY: PLT EST: ADEQUATE

## 2015-10-28 MED ORDER — HYDROCODONE-ACETAMINOPHEN 5-325 MG PO TABS
1.0000 | ORAL_TABLET | ORAL | Status: DC | PRN
Start: 2015-10-28 — End: 2015-11-29

## 2015-10-28 NOTE — Telephone Encounter (Signed)
GAVE PATIENT AVS REPORT AND APPOINTMENTS FOR [facility] AND June 2017.

## 2015-10-28 NOTE — Progress Notes (Signed)
  [facility] OFFICE PROGRESS NOTE   Diagnosis: Myelofibrosis, breast cancer  INTERVAL HISTORY:   Ms. Briski returns as scheduled. She continues hydroxyurea. No fever. She has noted improvement and sweats and bone pain. She takes ibuprofen alternating with 1-2 hydrocodone tablets per day for relief of pain. No nausea. She has felt less full than the left abdomen.  Objective:  Vital signs in last 24 hours:  Blood pressure 154/77, pulse 77, temperature 98.6 F (37 C), temperature source Oral, resp. rate 18, height $RemoveBe'5\' 9"'EjIVpRQCZ$  (1.753 m), weight 170 lb 8 oz (77.338 kg), SpO2 100 %.    HEENT: No thrush or ulcers Resp: Lungs clear bilaterally Cardio: Regular rate and rhythm GI: No hepatomegaly, the spleen is palpable in the left abdomen extending 1-2 fingers across the midline and to the level of the umbilicus Vascular: No leg edema   Lab Results:  Lab Results  Component Value Date   WBC 8.6 10/28/2015   HGB 11.4* 10/28/2015   HCT 34.4* 10/28/2015   MCV 85.9 10/28/2015   PLT 257 10/28/2015   NEUTROABS 6.6* 10/28/2015     Medications: I have reviewed the patient's current medications.  Assessment/Plan: 1. Stage II left-sided breast cancer diagnosed in February 2000. Screening mammogram 10/05/2011 with suggestion of a subcentimeter oval mass on the left superiorly and no suspicious masses, calcifications or architectural distortion in the right breast. She underwent biopsy of the "asymmetric density of concern in the superior aspect of the right breast" on 10/13/2011. Pathology showed a fibroadenoma.  2. 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  3. 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  Hydrea started at a dose of 500 mg daily on 12/07/2014 4. Bone pain, likely a rheumatic manifestation of myelofibrosis. 5. History of intermittent low-grade fever and "night sweats," likely systemic manifestations of myelofibrosis. 6. History of  Nose bleeding-likely related to myelofibrosis with dysfunctional platelets 7. 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  8. Elevated creatinine July 2015-normal 08/06/2014, potentially related to blood pressure medication   Disposition:  Ms. Saffran appears stable. She does not wish to consider Jak-2 directed therapy. She will continue hydroxyurea. She will return for a CBC in 3 months and an office visit in 6 months.  Betsy Coder, MD  10/28/2015  3:26 PM

## 2015-11-20 ENCOUNTER — Encounter: Payer: Self-pay | Admitting: Oncology

## 2015-11-29 ENCOUNTER — Other Ambulatory Visit: Payer: Self-pay | Admitting: *Deleted

## 2015-11-29 DIAGNOSIS — D7581 Myelofibrosis: Secondary | ICD-10-CM

## 2015-11-29 MED ORDER — HYDROCODONE-ACETAMINOPHEN 5-325 MG PO TABS: 1.0000 | ORAL_TABLET | ORAL | Status: DC | PRN

## 2016-01-09 ENCOUNTER — Telehealth: Payer: Self-pay | Admitting: *Deleted

## 2016-01-09 ENCOUNTER — Other Ambulatory Visit: Payer: Self-pay | Admitting: *Deleted

## 2016-01-09 DIAGNOSIS — D7581 Myelofibrosis: Secondary | ICD-10-CM

## 2016-01-09 MED ORDER — HYDROCODONE-ACETAMINOPHEN 5-325 MG PO TABS: 1.0000 | ORAL_TABLET | ORAL | Status: DC | PRN

## 2016-01-09 NOTE — Telephone Encounter (Signed)
Notified pt refill for Vicodin is ready for p/u.  Pt verbalized understanding.

## 2016-01-09 NOTE — Telephone Encounter (Signed)
Patient called requesting refill for Vicodin.  Would like to pick this up tomorrow.  Return number when ready for pick up is [phone].

## 2016-01-19 ENCOUNTER — Other Ambulatory Visit: Payer: Self-pay | Admitting: Oncology

## 2016-01-24 ENCOUNTER — Other Ambulatory Visit (HOSPITAL_BASED_OUTPATIENT_CLINIC_OR_DEPARTMENT_OTHER): Payer: BLUE CROSS/BLUE SHIELD

## 2016-01-24 DIAGNOSIS — D7581 Myelofibrosis: Secondary | ICD-10-CM

## 2016-01-24 LAB — MANUAL DIFFERENTIAL
ALC: 1 10*3/uL (ref 0.9–3.3)
ANC (CHCC manual diff): 5.9 10*3/uL (ref 1.5–6.5)
BAND NEUTROPHILS: 18 % — AB (ref 0–10)
LYMPH: 14 % (ref 14–49)
MONO: 5 % (ref 0–14)
Metamyelocytes: 5 % — ABNORMAL HIGH (ref 0–0)
Myelocytes: 5 % — ABNORMAL HIGH (ref 0–0)
PLT EST: ADEQUATE
SEG: 53 % (ref 38–77)
nRBC: 2 % — ABNORMAL HIGH (ref 0–0)

## 2016-01-24 LAB — COMPREHENSIVE METABOLIC PANEL
ALT: 25 U/L (ref 0–55)
AST: 23 U/L (ref 5–34)
Albumin: 4.1 g/dL (ref 3.5–5.0)
Alkaline Phosphatase: 74 U/L (ref 40–150)
Anion Gap: 8 mEq/L (ref 3–11)
BILIRUBIN TOTAL: 0.56 mg/dL (ref 0.20–1.20)
BUN: 9.7 mg/dL (ref 7.0–26.0)
CO2: 24 meq/L (ref 22–29)
Calcium: 9.1 mg/dL (ref 8.4–10.4)
Chloride: 108 mEq/L (ref 98–109)
Creatinine: 0.8 mg/dL (ref 0.6–1.1)
EGFR: 87 mL/min/{1.73_m2} — AB (ref >90)
GLUCOSE: 93 mg/dL (ref 70–140)
Potassium: 3.8 mEq/L (ref 3.5–5.1)
Sodium: 141 mEq/L (ref 136–145)
TOTAL PROTEIN: 7 g/dL (ref 6.4–8.3)

## 2016-01-24 LAB — CBC WITH DIFFERENTIAL/PLATELET
HEMATOCRIT: 34.4 % — AB (ref 34.8–46.6)
HGB: 11.4 g/dL — ABNORMAL LOW (ref 11.6–15.9)
MCH: 28 pg (ref 25.1–34.0)
MCHC: 33.1 g/dL (ref 31.5–36.0)
MCV: 84.8 fL (ref 79.5–101.0)
PLATELETS: 223 10*3/uL (ref 145–400)
RBC: 4.06 10*6/uL (ref 3.70–5.45)
RDW: 16.9 % — ABNORMAL HIGH (ref 11.2–14.5)
WBC: 7.3 10*3/uL (ref 3.9–10.3)

## 2016-01-24 LAB — LACTATE DEHYDROGENASE: LDH: 748 U/L — ABNORMAL HIGH (ref 125–245)

## 2016-01-27 ENCOUNTER — Telehealth: Payer: Self-pay | Admitting: *Deleted

## 2016-01-27 NOTE — Telephone Encounter (Signed)
-----   Message from Ladell Pier, MD sent at 01/24/2016  6:04 PM EST ----- Please call patient, cbc is stable, continue same hydroxyurea, f/u as scheduled

## 2016-01-28 NOTE — Telephone Encounter (Signed)
Pt returned call, CBC stable, per Dr. Benay Spice. She understands to continue Hydrea 500 mg daily and to follow up as scheduled.

## 2016-02-10 ENCOUNTER — Other Ambulatory Visit: Payer: Self-pay | Admitting: *Deleted

## 2016-02-10 DIAGNOSIS — D7581 Myelofibrosis: Secondary | ICD-10-CM

## 2016-02-10 NOTE — Telephone Encounter (Signed)
Received call from pt stating that she needs a refill on her vicodin & would like to p/u tomorrow.  Message to DR Sherill/ RN.

## 2016-02-11 MED ORDER — HYDROCODONE-ACETAMINOPHEN 5-325 MG PO TABS: 1.0000 | ORAL_TABLET | ORAL | Status: DC | PRN

## 2016-02-11 NOTE — Telephone Encounter (Signed)
Prescription will be left in book for pick up. 

## 2016-03-10 ENCOUNTER — Other Ambulatory Visit: Payer: Self-pay | Admitting: *Deleted

## 2016-03-10 DIAGNOSIS — D7581 Myelofibrosis: Secondary | ICD-10-CM

## 2016-03-10 MED ORDER — HYDROCODONE-ACETAMINOPHEN 5-325 MG PO TABS: 1.0000 | ORAL_TABLET | ORAL | Status: DC | PRN

## 2016-03-10 NOTE — Telephone Encounter (Signed)
Message from pt requesting Hydrocodone refill. Will pick up 4/19. Rx will be left in prescription book for pick up.

## 2016-04-16 ENCOUNTER — Other Ambulatory Visit: Payer: Self-pay | Admitting: *Deleted

## 2016-04-16 DIAGNOSIS — D7581 Myelofibrosis: Secondary | ICD-10-CM

## 2016-04-16 MED ORDER — HYDROCODONE-ACETAMINOPHEN 5-325 MG PO TABS: 1.0000 | ORAL_TABLET | ORAL | Status: DC | PRN

## 2016-04-17 ENCOUNTER — Encounter: Payer: Self-pay | Admitting: Oncology

## 2016-04-17 ENCOUNTER — Encounter: Payer: Self-pay | Admitting: *Deleted

## 2016-04-17 NOTE — Progress Notes (Signed)
Late entry from 04/16/16-Message received from triage that pt requests refill on vicodin and assistance in getting out of jury duty.  Vicodin refill complete per Dr. Benay Spice and pt notified that it is ready for pick up.  Pt instructed to bring in jury summons for Dr. Benay Spice to dictate a letter for her.

## 2016-04-17 NOTE — Progress Notes (Signed)
Message left on patients private cell phone notifying her that jury duty letter is ready and to call us if she would like to pick it up or have it mailed.

## 2016-04-21 ENCOUNTER — Telehealth: Payer: Self-pay | Admitting: *Deleted

## 2016-04-21 NOTE — Telephone Encounter (Signed)
Message from pt requesting jury duty letter and form be mailed to her. Same done.

## 2016-04-28 ENCOUNTER — Telehealth: Payer: Self-pay | Admitting: Oncology

## 2016-04-28 NOTE — Telephone Encounter (Signed)
pt called to cancel 6/9 apt, pt will call back to resched when she gets her [place] work schedule

## 2016-05-01 ENCOUNTER — Other Ambulatory Visit: Payer: BLUE CROSS/BLUE SHIELD

## 2016-05-01 ENCOUNTER — Ambulatory Visit: Payer: BLUE CROSS/BLUE SHIELD | Admitting: Oncology

## 2016-05-06 ENCOUNTER — Other Ambulatory Visit: Payer: Self-pay | Admitting: Oncology

## 2016-05-07 ENCOUNTER — Other Ambulatory Visit: Payer: Self-pay | Admitting: *Deleted

## 2016-05-07 ENCOUNTER — Telehealth: Payer: Self-pay | Admitting: Oncology

## 2016-05-07 NOTE — Telephone Encounter (Signed)
per pof to sch pt appt-cld pt and left a message of time & date of appt 7/25@12 

## 2016-05-11 ENCOUNTER — Telehealth: Payer: Self-pay | Admitting: Oncology

## 2016-05-11 NOTE — Telephone Encounter (Signed)
pt called to cx 7/25 apt... pt advise MD wanted to see her in 1-2 months.. pt was aware but does not want to come in until september. apt made 9/12 per pt request

## 2016-06-09 ENCOUNTER — Telehealth: Payer: Self-pay | Admitting: *Deleted

## 2016-06-09 DIAGNOSIS — D7581 Myelofibrosis: Secondary | ICD-10-CM

## 2016-06-09 MED ORDER — HYDROCODONE-ACETAMINOPHEN 5-325 MG PO TABS: 1.0000 | ORAL_TABLET | ORAL | Status: DC | PRN

## 2016-06-09 NOTE — Telephone Encounter (Signed)
Message from pt requesting refill on Hydrocodone. Noted pt's office visit has been moved out to September per pt's request. Noted Hydrea has not been refilled since March. Reviewed with Dr. Benay Spice: Pt needs labs this month.  Returned call to pt, she can come in 7/21 @ 2PM for lab. She reports she stopped Hydrea 3 mos ago because she was not seeing any benefit and wants to see what her labs look like off of Hydrea. Order to schedulers for lab appt. Informed pt Rx will be left up front for pick up.

## 2016-06-12 ENCOUNTER — Ambulatory Visit (HOSPITAL_BASED_OUTPATIENT_CLINIC_OR_DEPARTMENT_OTHER): Payer: BLUE CROSS/BLUE SHIELD

## 2016-06-12 DIAGNOSIS — D7581 Myelofibrosis: Secondary | ICD-10-CM | POA: Diagnosis not present

## 2016-06-12 LAB — CBC WITH DIFFERENTIAL/PLATELET
BASO%: 1.3 % (ref 0.0–2.0)
Basophils Absolute: 0.1 10*3/uL (ref 0.0–0.1)
EOS%: 0.3 % (ref 0.0–7.0)
Eosinophils Absolute: 0 10*3/uL (ref 0.0–0.5)
HCT: 33.3 % — ABNORMAL LOW (ref 34.8–46.6)
HGB: 11 g/dL — ABNORMAL LOW (ref 11.6–15.9)
LYMPH%: 18.2 % (ref 14.0–49.7)
MCH: 28 pg (ref 25.1–34.0)
MCHC: 33.1 g/dL (ref 31.5–36.0)
MCV: 84.8 fL (ref 79.5–101.0)
MONO#: 0.5 10*3/uL (ref 0.1–0.9)
MONO%: 6.7 % (ref 0.0–14.0)
NEUT%: 73.5 % (ref 38.4–76.8)
NEUTROS ABS: 5.1 10*3/uL (ref 1.5–6.5)
Platelets: 204 10*3/uL (ref 145–400)
RBC: 3.93 10*6/uL (ref 3.70–5.45)
RDW: 16.5 % — ABNORMAL HIGH (ref 11.2–14.5)
WBC: 7 10*3/uL (ref 3.9–10.3)
lymph#: 1.3 10*3/uL (ref 0.9–3.3)

## 2016-06-12 LAB — TECHNOLOGIST REVIEW

## 2016-06-16 ENCOUNTER — Ambulatory Visit: Payer: BLUE CROSS/BLUE SHIELD | Admitting: Oncology

## 2016-06-16 ENCOUNTER — Other Ambulatory Visit: Payer: BLUE CROSS/BLUE SHIELD

## 2016-06-18 ENCOUNTER — Telehealth: Payer: Self-pay | Admitting: *Deleted

## 2016-06-18 NOTE — Telephone Encounter (Signed)
Message received from pt to obtain her lab results from 06/12/16.  Call placed back to pt and CBC results given to pt.  Pt states that she is taking Hydrea as prescribed and will be here for scheduled appt in September to see Dr. Benay Spice.

## 2016-07-10 ENCOUNTER — Other Ambulatory Visit: Payer: Self-pay | Admitting: *Deleted

## 2016-07-10 DIAGNOSIS — D7581 Myelofibrosis: Secondary | ICD-10-CM

## 2016-07-10 MED ORDER — IBUPROFEN 800 MG PO TABS: 800.0000 mg | ORAL_TABLET | Freq: Three times a day (TID) | ORAL | 3 refills | Status: DC

## 2016-07-22 ENCOUNTER — Telehealth: Payer: Self-pay | Admitting: *Deleted

## 2016-07-22 DIAGNOSIS — D7581 Myelofibrosis: Secondary | ICD-10-CM

## 2016-07-22 MED ORDER — HYDROCODONE-ACETAMINOPHEN 5-325 MG PO TABS: 1.0000 | ORAL_TABLET | ORAL | 0 refills | Status: DC | PRN

## 2016-07-22 NOTE — Telephone Encounter (Signed)
Patient called requesting refill for Norco 5-325 mg.  Request to pick up prescription.  Will come in this Friday 07-24-2016 for pick up.

## 2016-07-22 NOTE — Telephone Encounter (Signed)
Refill will be left in prescription book for pick up.

## 2016-08-04 ENCOUNTER — Other Ambulatory Visit (HOSPITAL_BASED_OUTPATIENT_CLINIC_OR_DEPARTMENT_OTHER): Payer: BLUE CROSS/BLUE SHIELD

## 2016-08-04 ENCOUNTER — Telehealth: Payer: Self-pay | Admitting: Oncology

## 2016-08-04 ENCOUNTER — Ambulatory Visit (HOSPITAL_BASED_OUTPATIENT_CLINIC_OR_DEPARTMENT_OTHER): Payer: BLUE CROSS/BLUE SHIELD | Admitting: Oncology

## 2016-08-04 VITALS — BP 136/95 | HR 95 | Temp 98.8°F | Resp 17 | Ht 69.0 in | Wt 171.5 lb

## 2016-08-04 DIAGNOSIS — D7581 Myelofibrosis: Secondary | ICD-10-CM

## 2016-08-04 LAB — CBC WITH DIFFERENTIAL/PLATELET
BASO%: 1 % (ref 0.0–2.0)
BASOS ABS: 0.1 10*3/uL (ref 0.0–0.1)
EOS ABS: 0 10*3/uL (ref 0.0–0.5)
EOS%: 0.3 % (ref 0.0–7.0)
HEMATOCRIT: 35 % (ref 34.8–46.6)
HEMOGLOBIN: 11.7 g/dL (ref 11.6–15.9)
LYMPH#: 1.6 10*3/uL (ref 0.9–3.3)
LYMPH%: 15.5 % (ref 14.0–49.7)
MCH: 29.1 pg (ref 25.1–34.0)
MCHC: 33.5 g/dL (ref 31.5–36.0)
MCV: 86.9 fL (ref 79.5–101.0)
MONO#: 0.9 10*3/uL (ref 0.1–0.9)
MONO%: 8.5 % (ref 0.0–14.0)
NEUT#: 7.6 10*3/uL — ABNORMAL HIGH (ref 1.5–6.5)
NEUT%: 74.7 % (ref 38.4–76.8)
PLATELETS: 245 10*3/uL (ref 145–400)
RBC: 4.03 10*6/uL (ref 3.70–5.45)
RDW: 18.7 % — AB (ref 11.2–14.5)
WBC: 10.2 10*3/uL (ref 3.9–10.3)

## 2016-08-04 LAB — TECHNOLOGIST REVIEW

## 2016-08-04 NOTE — Addendum Note (Signed)
Addended by: Brien Few on: 08/04/2016 04:36 PM   Modules accepted: Orders

## 2016-08-04 NOTE — Telephone Encounter (Signed)
Gave patient avs report and appointments for December and march

## 2016-08-04 NOTE — Progress Notes (Signed)
  [name] OFFICE PROGRESS NOTE   Diagnosis: Breast cancer, myelofibrosis  INTERVAL HISTORY:   [name] returns as scheduled. She missed her last scheduled point she reports discontinuing hydroxyurea earlier this year and resuming the hydroxyurea in July. She has not noted a change in her symptoms with the hydroxyurea. She continues to have night sweats and bone pain. She takes approximate 1 hydrocodone per day and ibuprofen for relief of pain. Good appetite. No nausea.  Objective:  Vital signs in last 24 hours:  Blood pressure (!) 136/95, pulse 95, temperature 98.8 F (37.1 C), temperature source Oral, resp. rate 17, height _0  (1.753 m), weight 171 lb 8 oz (77.8 kg), SpO2 99 %.    Lymphatics: No cervical, supraclavicular, or axillary nodes Resp: Lungs clear bilaterally Cardio: Regular rate and rhythm GI: No hepatomegaly, the spleen tip is palpable in the left abdomen extending to the umbilicus and one to 2 fingers to the right of the midline Vascular: No leg edema Breasts: Says post left mastectomy with an implant in place. No evidence for chest wall tumor recurrence. Right breast without mass.   Lab Results:  Lab Results  Component Value Date   WBC 10.2 08/04/2016   HGB 11.7 08/04/2016   HCT 35.0 08/04/2016   MCV 86.9 08/04/2016   PLT 245 08/04/2016   NEUTROABS 7.6 (H) 08/04/2016   Medications: I have reviewed the patient's current medications.  Assessment/Plan: 1. Stage II left-sided breast cancer diagnosed in February 2000. Screening mammogram 10/05/2011 with suggestion of a subcentimeter oval mass on the left superiorly and no suspicious masses, calcifications or architectural distortion in the right breast. She underwent biopsy of the "asymmetric density of concern in the superior aspect of the right breast" on 10/13/2011. Pathology showed a fibroadenoma.  2. 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  3. Splenomegaly  secondary to myelofibrosis, stable.  Hydrea started at a dose of 500 mg daily on 12/07/2014  Discontinued in early 2017, resume July 2017, discontinued permanently 08/04/2016 4. Bone pain, likely a rheumatic manifestation of myelofibrosis. 5. History of intermittent low-grade fever and "night sweats," likely systemic manifestations of myelofibrosis. 6. History of Nose bleeding-likely related to myelofibrosis with dysfunctional platelets 7. 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  8. Elevated creatinine July 2015-normal 08/06/2014, potentially related to blood pressure medication     Disposition:  Ms. [name] appears unchanged. She continues to have sweats and bone pain secondary to myelofibrosis. She has not noted any benefit from hydroxyurea. We decided to discontinue the hydroxyurea.  Ms. [name] does not wish to consider a JAK-2 agent. I recommended a follow-up visit with the transplant team at [facility]. She would like to wait on this until next year.  She will schedule a mammogram for December. She will return for a lab visit in 3 months and an office visit in 6 months.  Betsy Coder, MD  08/04/2016  4:04 PM

## 2016-08-25 ENCOUNTER — Other Ambulatory Visit: Payer: Self-pay | Admitting: *Deleted

## 2016-08-25 DIAGNOSIS — D7581 Myelofibrosis: Secondary | ICD-10-CM

## 2016-08-25 MED ORDER — HYDROCODONE-ACETAMINOPHEN 5-325 MG PO TABS: 1.0000 | ORAL_TABLET | ORAL | 0 refills | Status: DC | PRN

## 2016-08-25 NOTE — Progress Notes (Signed)
Call received from patient requesting refill on Vicodin to be picked up on 08/28/16.  Dr. Benay Spice notified.

## 2016-09-25 ENCOUNTER — Telehealth: Payer: Self-pay | Admitting: *Deleted

## 2016-09-25 DIAGNOSIS — D7581 Myelofibrosis: Secondary | ICD-10-CM

## 2016-09-25 MED ORDER — HYDROCODONE-ACETAMINOPHEN 5-325 MG PO TABS: 1.0000 | ORAL_TABLET | ORAL | 0 refills | Status: DC | PRN

## 2016-09-25 NOTE — Telephone Encounter (Signed)
Received fax from [facility] call service. Pt called to request refill on Hydrocodone. She reports she takes one or two Hydrocodone per day. Returned call, informed her prescription will be left at front desk for pick up. She voiced understanding.

## 2016-09-25 NOTE — Addendum Note (Signed)
Addended by: Brien Few on: 09/25/2016 03:56 PM   Modules accepted: Orders

## 2016-10-26 ENCOUNTER — Other Ambulatory Visit: Payer: Self-pay | Admitting: *Deleted

## 2016-10-26 ENCOUNTER — Telehealth: Payer: Self-pay | Admitting: *Deleted

## 2016-10-26 DIAGNOSIS — D7581 Myelofibrosis: Secondary | ICD-10-CM

## 2016-10-26 MED ORDER — HYDROCODONE-ACETAMINOPHEN 5-325 MG PO TABS: 1.0000 | ORAL_TABLET | ORAL | 0 refills | Status: DC | PRN

## 2016-10-26 NOTE — Telephone Encounter (Signed)
Call placed to patient to notify her that Vicodin prescription is ready for pick up.

## 2016-11-03 ENCOUNTER — Other Ambulatory Visit (HOSPITAL_BASED_OUTPATIENT_CLINIC_OR_DEPARTMENT_OTHER): Payer: BLUE CROSS/BLUE SHIELD

## 2016-11-03 DIAGNOSIS — D7581 Myelofibrosis: Secondary | ICD-10-CM | POA: Diagnosis not present

## 2016-11-03 LAB — CBC WITH DIFFERENTIAL/PLATELET
BASO%: 1 % (ref 0.0–2.0)
Basophils Absolute: 0.1 10*3/uL (ref 0.0–0.1)
EOS%: 0.4 % (ref 0.0–7.0)
Eosinophils Absolute: 0 10*3/uL (ref 0.0–0.5)
HCT: 33 % — ABNORMAL LOW (ref 34.8–46.6)
HGB: 10.8 g/dL — ABNORMAL LOW (ref 11.6–15.9)
LYMPH%: 16.6 % (ref 14.0–49.7)
MCH: 27.7 pg (ref 25.1–34.0)
MCHC: 32.7 g/dL (ref 31.5–36.0)
MCV: 84.6 fL (ref 79.5–101.0)
MONO#: 0.7 10*3/uL (ref 0.1–0.9)
MONO%: 7.7 % (ref 0.0–14.0)
NEUT%: 74.3 % (ref 38.4–76.8)
NEUTROS ABS: 6.8 10*3/uL — AB (ref 1.5–6.5)
NRBC: 1 % — AB (ref 0–0)
Platelets: 202 10*3/uL (ref 145–400)
RBC: 3.9 10*6/uL (ref 3.70–5.45)
RDW: 16 % — AB (ref 11.2–14.5)
WBC: 9.2 10*3/uL (ref 3.9–10.3)
lymph#: 1.5 10*3/uL (ref 0.9–3.3)

## 2016-11-03 LAB — TECHNOLOGIST REVIEW

## 2016-11-17 ENCOUNTER — Other Ambulatory Visit: Payer: Self-pay | Admitting: Oncology

## 2016-11-17 DIAGNOSIS — D7581 Myelofibrosis: Secondary | ICD-10-CM

## 2016-11-20 ENCOUNTER — Other Ambulatory Visit: Payer: Self-pay | Admitting: Medical Oncology

## 2016-11-20 NOTE — Telephone Encounter (Signed)
Request vicodin refill either today or Tuesday. Let her know when to pick it up.

## 2016-11-26 ENCOUNTER — Other Ambulatory Visit: Payer: Self-pay | Admitting: *Deleted

## 2016-11-26 DIAGNOSIS — D7581 Myelofibrosis: Secondary | ICD-10-CM

## 2016-11-26 MED ORDER — HYDROCODONE-ACETAMINOPHEN 5-325 MG PO TABS: 1.0000 | ORAL_TABLET | ORAL | 0 refills | Status: DC | PRN

## 2016-11-26 NOTE — Telephone Encounter (Signed)
Informed pt prescription is ready for pick up.

## 2016-11-26 NOTE — Telephone Encounter (Signed)
Calling about her vicodin rx. Is wanting to pick it up at lunchtime today.

## 2016-12-17 ENCOUNTER — Telehealth: Payer: Self-pay | Admitting: *Deleted

## 2016-12-17 DIAGNOSIS — D7581 Myelofibrosis: Secondary | ICD-10-CM

## 2016-12-17 MED ORDER — HYDROCODONE-ACETAMINOPHEN 5-325 MG PO TABS: 1.0000 | ORAL_TABLET | ORAL | 0 refills | Status: DC | PRN

## 2016-12-17 NOTE — Telephone Encounter (Signed)
Call from pt requesting Hydrocodone refill. Med refilled, per Dr. Benay Spice.

## 2017-02-02 ENCOUNTER — Other Ambulatory Visit (HOSPITAL_BASED_OUTPATIENT_CLINIC_OR_DEPARTMENT_OTHER): Payer: BLUE CROSS/BLUE SHIELD

## 2017-02-02 ENCOUNTER — Ambulatory Visit (HOSPITAL_BASED_OUTPATIENT_CLINIC_OR_DEPARTMENT_OTHER): Payer: BLUE CROSS/BLUE SHIELD | Admitting: Oncology

## 2017-02-02 DIAGNOSIS — R61 Generalized hyperhidrosis: Secondary | ICD-10-CM

## 2017-02-02 DIAGNOSIS — D7581 Myelofibrosis: Secondary | ICD-10-CM

## 2017-02-02 DIAGNOSIS — Z853 Personal history of malignant neoplasm of breast: Secondary | ICD-10-CM

## 2017-02-02 DIAGNOSIS — M898X9 Other specified disorders of bone, unspecified site: Secondary | ICD-10-CM

## 2017-02-02 LAB — COMPREHENSIVE METABOLIC PANEL
ALBUMIN: 4 g/dL (ref 3.5–5.0)
ALK PHOS: 79 U/L (ref 40–150)
ALT: 15 U/L (ref 0–55)
ANION GAP: 8 meq/L (ref 3–11)
AST: 20 U/L (ref 5–34)
BUN: 10.6 mg/dL (ref 7.0–26.0)
CALCIUM: 9.1 mg/dL (ref 8.4–10.4)
CHLORIDE: 107 meq/L (ref 98–109)
CO2: 23 mEq/L (ref 22–29)
Creatinine: 0.8 mg/dL (ref 0.6–1.1)
EGFR: 90 mL/min/{1.73_m2} — AB (ref >90)
Glucose: 103 mg/dl (ref 70–140)
Potassium: 3.8 mEq/L (ref 3.5–5.1)
Sodium: 138 mEq/L (ref 136–145)
Total Bilirubin: 0.66 mg/dL (ref 0.20–1.20)
Total Protein: 7.3 g/dL (ref 6.4–8.3)

## 2017-02-02 LAB — CBC WITH DIFFERENTIAL/PLATELET
HEMATOCRIT: 31.4 % — AB (ref 34.8–46.6)
HGB: 10.2 g/dL — ABNORMAL LOW (ref 11.6–15.9)
MCH: 27.8 pg (ref 25.1–34.0)
MCHC: 32.5 g/dL (ref 31.5–36.0)
MCV: 85.6 fL (ref 79.5–101.0)
PLATELETS: 193 10*3/uL (ref 145–400)
RBC: 3.67 10*6/uL — AB (ref 3.70–5.45)
RDW: 17.4 % — ABNORMAL HIGH (ref 11.2–14.5)
WBC: 6.5 10*3/uL (ref 3.9–10.3)

## 2017-02-02 LAB — MANUAL DIFFERENTIAL
ALC: 1.1 10*3/uL (ref 0.9–3.3)
ANC (CHCC MAN DIFF): 4.8 10*3/uL (ref 1.5–6.5)
BASOPHIL: 1 % (ref 0–2)
Band Neutrophils: 5 % (ref 0–10)
EOS%: 1 % (ref 0–7)
LYMPH: 17 % (ref 14–49)
METAMYELOCYTES PCT: 4 % — AB (ref 0–0)
MONO: 7 % (ref 0–14)
MYELOCYTES: 4 % — AB (ref 0–0)
NRBC: 3 % — AB (ref 0–0)
PLT EST: ADEQUATE
SEG: 61 % (ref 38–77)

## 2017-02-02 MED ORDER — HYDROCODONE-ACETAMINOPHEN 5-325 MG PO TABS: 1.0000 | ORAL_TABLET | ORAL | 0 refills | Status: DC | PRN

## 2017-02-02 NOTE — Progress Notes (Addendum)
  [place] OFFICE PROGRESS NOTE   Diagnosis:  Breast cancer, myelofibrosis  INTERVAL HISTORY:   [name] returns as scheduled. She feels well. She reports a good energy level. She denies shortness of breath. No chest pain. She has a good appetite. No fever. Night sweats are better. She continues to have generalized achy pain twice a day. The pain is relieved with ibuprofen or Vicodin.  Objective:  Vital signs in last 24 hours:  Blood pressure (!) 149/82, pulse 66, temperature 98.2 F (36.8 C), temperature source Oral, resp. rate 18, height '5\' 9"'$  (1.753 m), weight 166 lb 8 oz (75.5 kg), SpO2 100 %.    HEENT: No thrush or ulcers. Lymphatics: No palpable cervical, supraclavicular or axillary lymph nodes. Resp: Lungs clear bilaterally. Cardio: Regular rate and rhythm. GI: Question palpable liver edge. Spleen is palpable to just below the level of the umbilicus in the left abdomen. Vascular: No leg edema. Calves soft and nontender.    Lab Results:  Lab Results  Component Value Date   WBC 6.5 02/02/2017   HGB 10.2 (L) 02/02/2017   HCT 31.4 (L) 02/02/2017   MCV 85.6 02/02/2017   PLT 193 02/02/2017   NEUTROABS 6.8 (H) 11/03/2016    Imaging:  No results found.  Medications: I have reviewed the patient's current medications.  Assessment/Plan: 1. Stage II left-sided breast cancer diagnosed in February 2000. Screening mammogram 10/05/2011 with suggestion of a subcentimeter oval mass on the left superiorly and no suspicious masses, calcifications or architectural distortion in the right breast. She underwent biopsy of the "asymmetric density of concern in the superior aspect of the right breast" on 10/13/2011. Pathology showed a fibroadenoma.  2. 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  3. 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  Hydrea started at a dose of 500 mg daily on 12/07/2014  Discontinued in early 2017, resume  July 2017, discontinued permanently 08/04/2016 4. Bone pain, likely a rheumatic manifestation of myelofibrosis. 5. History of intermittent low-grade fever and "night sweats," likely systemic manifestations of myelofibrosis. 6. History of Nose bleeding-likely related to myelofibrosis with dysfunctional platelets 7. 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  8. Elevated creatinine July 2015-normal 08/06/2014, potentially related to blood pressure medication    Disposition: [name] remains stable from a hematologic standpoint. She continues to have bone pain and night sweats related to myelofibrosis. We discussed treatment with a JAK-2 agent. We also discussed a follow-up visit with the transplant team at [facility]. At present she prefers to continue routine follow-up here with an observation approach. She will return for labs in 3 months and a follow-up visit in 6 months. She will contact the office in the interim with any problems.  Patient seen with Dr. Benay Spice.    Ned Card ANP/GNP-BC   02/02/2017  4:14 PM  This was a shared visit with Ned Card. [name] appears stable. She will return for an office visit in 6 months.  Julieanne Manson, M.D.

## 2017-02-22 ENCOUNTER — Telehealth: Payer: Self-pay | Admitting: Oncology

## 2017-02-22 NOTE — Telephone Encounter (Signed)
Called patient to inform her of next scheduled appointments.  

## 2017-03-10 ENCOUNTER — Other Ambulatory Visit: Payer: Self-pay | Admitting: *Deleted

## 2017-03-10 DIAGNOSIS — D7581 Myelofibrosis: Secondary | ICD-10-CM

## 2017-03-10 MED ORDER — IBUPROFEN 800 MG PO TABS: 800.0000 mg | ORAL_TABLET | Freq: Three times a day (TID) | ORAL | 3 refills | Status: DC

## 2017-03-15 ENCOUNTER — Other Ambulatory Visit: Payer: Self-pay | Admitting: *Deleted

## 2017-03-15 DIAGNOSIS — D7581 Myelofibrosis: Secondary | ICD-10-CM

## 2017-03-15 MED ORDER — HYDROCODONE-ACETAMINOPHEN 5-325 MG PO TABS: 1.0000 | ORAL_TABLET | ORAL | 0 refills | Status: DC | PRN

## 2017-03-15 NOTE — Telephone Encounter (Signed)
Message from pt requesting refill of Hydrocodone. Left message informing her that script is ready for pick up.

## 2017-04-08 ENCOUNTER — Telehealth: Payer: Self-pay

## 2017-04-08 DIAGNOSIS — D7581 Myelofibrosis: Secondary | ICD-10-CM

## 2017-04-08 MED ORDER — HYDROCODONE-ACETAMINOPHEN 5-325 MG PO TABS: 1.0000 | ORAL_TABLET | ORAL | 0 refills | Status: DC | PRN

## 2017-04-08 NOTE — Telephone Encounter (Signed)
Left message informing pt that script is ready for pick up.

## 2017-04-08 NOTE — Addendum Note (Signed)
Addended by: Brien Few on: 04/08/2017 10:48 AM   Modules accepted: Orders

## 2017-04-08 NOTE — Telephone Encounter (Signed)
Pt called for a vicodin refill

## 2017-05-05 ENCOUNTER — Other Ambulatory Visit: Payer: BLUE CROSS/BLUE SHIELD

## 2017-05-12 ENCOUNTER — Other Ambulatory Visit: Payer: Self-pay | Admitting: *Deleted

## 2017-05-12 ENCOUNTER — Telehealth: Payer: Self-pay | Admitting: *Deleted

## 2017-05-12 DIAGNOSIS — D7581 Myelofibrosis: Secondary | ICD-10-CM

## 2017-05-12 MED ORDER — HYDROCODONE-ACETAMINOPHEN 5-325 MG PO TABS: 1.0000 | ORAL_TABLET | ORAL | 0 refills | Status: DC | PRN

## 2017-05-12 NOTE — Telephone Encounter (Signed)
Message received from patient requesting a refill for Vicodin.  Call placed back to patient to notify her that Vicodin prescription is ready for pick up.

## 2017-05-17 ENCOUNTER — Other Ambulatory Visit (HOSPITAL_BASED_OUTPATIENT_CLINIC_OR_DEPARTMENT_OTHER): Payer: BLUE CROSS/BLUE SHIELD

## 2017-05-17 DIAGNOSIS — D7581 Myelofibrosis: Secondary | ICD-10-CM | POA: Diagnosis not present

## 2017-05-17 LAB — CBC WITH DIFFERENTIAL/PLATELET
HEMATOCRIT: 31.1 % — AB (ref 34.8–46.6)
HEMOGLOBIN: 10.1 g/dL — AB (ref 11.6–15.9)
MCH: 28.5 pg (ref 25.1–34.0)
MCHC: 32.5 g/dL (ref 31.5–36.0)
MCV: 87.9 fL (ref 79.5–101.0)
PLATELETS: 182 10*3/uL (ref 145–400)
RBC: 3.54 10*6/uL — ABNORMAL LOW (ref 3.70–5.45)
RDW: 17.3 % — ABNORMAL HIGH (ref 11.2–14.5)
WBC: 7.1 10*3/uL (ref 3.9–10.3)

## 2017-05-17 LAB — MANUAL DIFFERENTIAL
ALC: 1.1 10*3/uL (ref 0.9–3.3)
ANC (CHCC manual diff): 5.7 10*3/uL (ref 1.5–6.5)
BLASTS: 2 % — AB (ref 0–0)
Band Neutrophils: 6 % (ref 0–10)
Basophil: 0 % (ref 0–2)
EOS: 1 % (ref 0–7)
LYMPH: 15 % (ref 14–49)
METAMYELOCYTES PCT: 8 % — AB (ref 0–0)
MONO: 2 % (ref 0–14)
MYELOCYTES: 3 % — AB (ref 0–0)
OTHER CELL: 0 % (ref 0–0)
PLT EST: ADEQUATE
PROMYELO: 0 % (ref 0–0)
SEG: 63 % (ref 38–77)
VARIANT LYMPH: 0 % (ref 0–0)
nRBC: 2 % — ABNORMAL HIGH (ref 0–0)

## 2017-05-18 ENCOUNTER — Telehealth: Payer: Self-pay | Admitting: *Deleted

## 2017-05-18 NOTE — Telephone Encounter (Signed)
-----   Message from Ladell Pier, MD sent at 05/17/2017  8:40 PM EDT ----- Please call patient , hb is stable, f/u as scheduled

## 2017-06-10 ENCOUNTER — Telehealth: Payer: Self-pay | Admitting: *Deleted

## 2017-06-10 DIAGNOSIS — D7581 Myelofibrosis: Secondary | ICD-10-CM

## 2017-06-10 MED ORDER — HYDROCODONE-ACETAMINOPHEN 5-325 MG PO TABS: 1.0000 | ORAL_TABLET | ORAL | 0 refills | Status: DC | PRN

## 2017-06-10 NOTE — Telephone Encounter (Signed)
Called patient notifying her prescription is ready for pick up.

## 2017-06-10 NOTE — Telephone Encounter (Signed)
Patient called requesting refill for Hydrocodone 5-325 mg.  I have enough to last through tomorrow.   Return number when ready for pick up is [phone].  In time range for a refill.  Last Hydrocodone order produced was on 05-12-2017.

## 2017-07-12 ENCOUNTER — Other Ambulatory Visit: Payer: Self-pay | Admitting: *Deleted

## 2017-07-12 DIAGNOSIS — D7581 Myelofibrosis: Secondary | ICD-10-CM

## 2017-07-12 MED ORDER — IBUPROFEN 800 MG PO TABS: 800.0000 mg | ORAL_TABLET | Freq: Three times a day (TID) | ORAL | 3 refills | Status: DC

## 2017-07-15 ENCOUNTER — Other Ambulatory Visit: Payer: Self-pay | Admitting: *Deleted

## 2017-07-15 DIAGNOSIS — D7581 Myelofibrosis: Secondary | ICD-10-CM

## 2017-07-15 MED ORDER — HYDROCODONE-ACETAMINOPHEN 5-325 MG PO TABS
1.0000 | ORAL_TABLET | ORAL | 0 refills | Status: DC | PRN
Start: 2017-07-15 — End: 2017-08-17

## 2017-07-15 NOTE — Telephone Encounter (Signed)
Message from pt requesting refill of Hydrocodone. Left message informing her refill is ready.

## 2017-07-27 ENCOUNTER — Telehealth: Payer: Self-pay | Admitting: Oncology

## 2017-07-27 NOTE — Telephone Encounter (Signed)
Pt called to r/s Sept appt to 11/5 at 3 pm

## 2017-08-05 ENCOUNTER — Other Ambulatory Visit: Payer: BLUE CROSS/BLUE SHIELD

## 2017-08-05 ENCOUNTER — Ambulatory Visit: Payer: BLUE CROSS/BLUE SHIELD | Admitting: Oncology

## 2017-08-17 ENCOUNTER — Other Ambulatory Visit: Payer: Self-pay | Admitting: *Deleted

## 2017-08-17 DIAGNOSIS — D7581 Myelofibrosis: Secondary | ICD-10-CM

## 2017-08-17 MED ORDER — HYDROCODONE-ACETAMINOPHEN 5-325 MG PO TABS
1.0000 | ORAL_TABLET | ORAL | 0 refills | Status: DC | PRN
Start: 2017-08-17 — End: 2017-09-15

## 2017-08-17 NOTE — Telephone Encounter (Signed)
Message from pt requesting Hydrocodone refill for persistent bone pain. Returned call, informed her script is ready for pick up.

## 2017-09-15 ENCOUNTER — Other Ambulatory Visit: Payer: Self-pay

## 2017-09-15 ENCOUNTER — Telehealth: Payer: Self-pay

## 2017-09-15 DIAGNOSIS — D7581 Myelofibrosis: Secondary | ICD-10-CM

## 2017-09-15 MED ORDER — HYDROCODONE-ACETAMINOPHEN 5-325 MG PO TABS: 1.0000 | ORAL_TABLET | ORAL | 0 refills | Status: DC | PRN

## 2017-09-15 NOTE — Telephone Encounter (Signed)
Call placed to inform pt that her Vicodin prescription has been refilled. Pt verbalizes understanding and knows to come to [facility] during business hours with photo I.D. To pick it up.

## 2017-09-27 ENCOUNTER — Ambulatory Visit: Payer: BLUE CROSS/BLUE SHIELD | Admitting: Oncology

## 2017-09-27 ENCOUNTER — Telehealth: Payer: Self-pay | Admitting: *Deleted

## 2017-09-27 ENCOUNTER — Other Ambulatory Visit: Payer: BLUE CROSS/BLUE SHIELD

## 2017-09-27 NOTE — Telephone Encounter (Signed)
Message from pt requesting to reschedule appts. She is in a pre-surgical appt with her husband and will not get out in time. Dr. Benay Spice made aware. Message to schedulers to call pt to R/S.

## 2017-09-28 ENCOUNTER — Telehealth: Payer: Self-pay | Admitting: Oncology

## 2017-09-28 NOTE — Telephone Encounter (Signed)
Scheduled appt per 11/5 sch message - left message with appt date and time  

## 2017-10-07 ENCOUNTER — Ambulatory Visit (HOSPITAL_BASED_OUTPATIENT_CLINIC_OR_DEPARTMENT_OTHER): Payer: BLUE CROSS/BLUE SHIELD | Admitting: Oncology

## 2017-10-07 ENCOUNTER — Other Ambulatory Visit (HOSPITAL_BASED_OUTPATIENT_CLINIC_OR_DEPARTMENT_OTHER): Payer: BLUE CROSS/BLUE SHIELD

## 2017-10-07 DIAGNOSIS — D7581 Myelofibrosis: Secondary | ICD-10-CM | POA: Diagnosis not present

## 2017-10-07 DIAGNOSIS — C50912 Malignant neoplasm of unspecified site of left female breast: Secondary | ICD-10-CM

## 2017-10-07 DIAGNOSIS — M898X9 Other specified disorders of bone, unspecified site: Secondary | ICD-10-CM

## 2017-10-07 LAB — CBC WITH DIFFERENTIAL/PLATELET
HCT: 33.6 % — ABNORMAL LOW (ref 34.8–46.6)
HEMOGLOBIN: 10.8 g/dL — AB (ref 11.6–15.9)
MCH: 28.3 pg (ref 25.1–34.0)
MCHC: 32.1 g/dL (ref 31.5–36.0)
MCV: 88 fL (ref 79.5–101.0)
Platelets: 191 10*3/uL (ref 145–400)
RBC: 3.82 10*6/uL (ref 3.70–5.45)
RDW: 17.8 % — AB (ref 11.2–14.5)
WBC: 8.7 10*3/uL (ref 3.9–10.3)

## 2017-10-07 LAB — MANUAL DIFFERENTIAL
ALC: 1.5 10*3/uL (ref 0.9–3.3)
ANC (CHCC manual diff): 6.3 10*3/uL (ref 1.5–6.5)
BASOPHIL: 2 % (ref 0–2)
Band Neutrophils: 11 % — ABNORMAL HIGH (ref 0–10)
Blasts: 2 % — ABNORMAL HIGH (ref 0–0)
EOS: 0 % (ref 0–7)
LYMPH: 17 % (ref 14–49)
METAMYELOCYTES PCT: 5 % — AB (ref 0–0)
MONO: 7 % (ref 0–14)
Myelocytes: 2 % — ABNORMAL HIGH (ref 0–0)
NRBC: 2 % — AB (ref 0–0)
OTHER CELL: 0 % (ref 0–0)
PLT EST: ADEQUATE
PROMYELO: 0 % (ref 0–0)
SEG: 54 % (ref 38–77)
Variant Lymph: 0 % (ref 0–0)

## 2017-10-07 LAB — COMPREHENSIVE METABOLIC PANEL
ALT: 16 U/L (ref 0–55)
AST: 22 U/L (ref 5–34)
Albumin: 4.2 g/dL (ref 3.5–5.0)
Alkaline Phosphatase: 65 U/L (ref 40–150)
Anion Gap: 9 mEq/L (ref 3–11)
BILIRUBIN TOTAL: 0.61 mg/dL (ref 0.20–1.20)
BUN: 14.3 mg/dL (ref 7.0–26.0)
CO2: 23 meq/L (ref 22–29)
CREATININE: 0.8 mg/dL (ref 0.6–1.1)
Calcium: 9.3 mg/dL (ref 8.4–10.4)
Chloride: 106 mEq/L (ref 98–109)
EGFR: >60 mL/min/{1.73_m2} (ref >60)
GLUCOSE: 100 mg/dL (ref 70–140)
Potassium: 3.7 mEq/L (ref 3.5–5.1)
SODIUM: 137 meq/L (ref 136–145)
TOTAL PROTEIN: 7.3 g/dL (ref 6.4–8.3)

## 2017-10-07 MED ORDER — HYDROCODONE-ACETAMINOPHEN 5-325 MG PO TABS: 1.0000 | ORAL_TABLET | ORAL | 0 refills | Status: DC | PRN

## 2017-10-07 NOTE — Progress Notes (Signed)
[facility] [place] OFFICE PROGRESS NOTE   Diagnosis: Breast cancer, myelofibrosis  INTERVAL HISTORY:   [name] returns as scheduled.  She continues to have diffuse bone pain.  She takes 1-2 hydrocodone tablets and 1 ibuprofen daily.  No fever or night sweats.  No nausea.  She does not wish to consider a Jak-2 inhibitor or transplant evaluation.  She relates weight loss to caring for her grandson.  Objective:  Vital signs in last 24 hours:  Blood pressure (!) 154/78, pulse 68, temperature 98.5 F (36.9 C), temperature source Oral, resp. rate 18, weight 159 lb 6.4 oz (72.3 kg), SpO2 100 %.    HEENT: Neck without mass Lymphatics: No cervical, supraclavicular, or axillary nodes Resp: Lungs clear bilaterally Cardio: Regular rate and rhythm GI: The spleen is palpable in the left abdomen extending a few fingers below the umbilicus and to the right of midline.  No hepatomegaly Vascular: No leg edema Breast: Status post left mastectomy with an implant in place.  No evidence for chest wall tumor recurrence.  Right breast without mass.   Lab Results:  Lab Results  Component Value Date   WBC 8.7 10/07/2017   HGB 10.8 (L) 10/07/2017   HCT 33.6 (L) 10/07/2017   MCV 88.0 10/07/2017   PLT 191 10/07/2017   NEUTROABS 6.8 (H) 11/03/2016    CMP     Component Value Date/Time   NA 137 10/07/2017 1540   K 3.7 10/07/2017 1540   CL 103 11/07/2012 2150   CO2 23 10/07/2017 1540   GLUCOSE 100 10/07/2017 1540   BUN 14.3 10/07/2017 1540   CREATININE 0.8 10/07/2017 1540   CALCIUM 9.3 10/07/2017 1540   PROT 7.3 10/07/2017 1540   ALBUMIN 4.2 10/07/2017 1540   AST 22 10/07/2017 1540   ALT 16 10/07/2017 1540   ALKPHOS 65 10/07/2017 1540   BILITOT 0.61 10/07/2017 1540   GFRNONAA >90 11/07/2012 2150   GFRAA >90 11/07/2012 2150     Medications: I have reviewed the patient's current medications.  Assessment/Plan: 1. Stage II left-sided breast cancer diagnosed in February 2000.  Screening mammogram 10/05/2011 with suggestion of a subcentimeter oval mass on the left superiorly and no suspicious masses, calcifications or architectural distortion in the right breast. She underwent biopsy of the "asymmetric density of concern in the superior aspect of the right breast" on 10/13/2011. Pathology showed a fibroadenoma.  2. 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  3. 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  Hydrea started at a dose of 500 mg daily on 12/07/2014  Discontinued in early 2017, resume July 2017, discontinued permanently 08/04/2016 4. Bone pain, likely a rheumatic manifestation of myelofibrosis. 5. History of intermittent low-grade fever and "night sweats," likely systemic manifestations of myelofibrosis. 6. History of Nose bleeding-likely related to myelofibrosis with dysfunctional platelets 7. 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  8. Elevated creatinine July 2015-normal 08/06/2014, potentially related to blood pressure medication    Disposition:  [name] is in clinical remission from breast cancer.  She will schedule a screening mammogram.  She is stable from a hematologic standpoint.  She will continue hydrocodone and ibuprofen as needed for pain.  She will return for a lab visit in 3 months and an office visit in 6 months.  She does not wish to consider a transplant  evaluation or a JAK-2 inhibitor.  15 minutes were spent with the patient today.  The majority of the time was used for counseling and coordination of care.  Betsy Coder, MD  10/07/2017  5:03 PM

## 2017-10-08 ENCOUNTER — Other Ambulatory Visit: Payer: Self-pay | Admitting: Medical Oncology

## 2017-10-08 DIAGNOSIS — D7581 Myelofibrosis: Secondary | ICD-10-CM

## 2017-10-08 MED ORDER — IBUPROFEN 800 MG PO TABS: 800.0000 mg | ORAL_TABLET | Freq: Three times a day (TID) | ORAL | 3 refills | Status: DC

## 2017-10-15 ENCOUNTER — Telehealth: Payer: Self-pay | Admitting: Oncology

## 2017-10-15 NOTE — Telephone Encounter (Signed)
Spoke with patient regarding appts that were added per 11/15 los.

## 2017-11-08 ENCOUNTER — Other Ambulatory Visit: Payer: Self-pay

## 2017-11-08 DIAGNOSIS — D7581 Myelofibrosis: Secondary | ICD-10-CM

## 2017-11-08 MED ORDER — HYDROCODONE-ACETAMINOPHEN 5-325 MG PO TABS: 1.0000 | ORAL_TABLET | ORAL | 0 refills | Status: DC | PRN

## 2017-11-09 ENCOUNTER — Telehealth: Payer: Self-pay | Admitting: *Deleted

## 2017-11-09 NOTE — Telephone Encounter (Signed)
Patient requesting medication that appears to refilled on 11/08/2017

## 2017-12-09 ENCOUNTER — Telehealth: Payer: Self-pay

## 2017-12-09 DIAGNOSIS — D7581 Myelofibrosis: Secondary | ICD-10-CM

## 2017-12-09 MED ORDER — HYDROCODONE-ACETAMINOPHEN 5-325 MG PO TABS: 1.0000 | ORAL_TABLET | ORAL | 0 refills | Status: DC | PRN

## 2017-12-09 NOTE — Telephone Encounter (Signed)
Returned call to patient. Vicodin prescription refill to be picked up. Voiced understanding.

## 2018-01-04 ENCOUNTER — Inpatient Hospital Stay: Payer: BLUE CROSS/BLUE SHIELD | Attending: Oncology

## 2018-01-04 DIAGNOSIS — D7581 Myelofibrosis: Secondary | ICD-10-CM | POA: Diagnosis present

## 2018-01-04 DIAGNOSIS — C50912 Malignant neoplasm of unspecified site of left female breast: Secondary | ICD-10-CM | POA: Insufficient documentation

## 2018-01-04 LAB — CBC WITH DIFFERENTIAL/PLATELET
BASOS PCT: 1 %
Basophils Absolute: 0.1 10*3/uL (ref 0.0–0.1)
EOS PCT: 1 %
Eosinophils Absolute: 0.1 10*3/uL (ref 0.0–0.5)
HCT: 33.5 % — ABNORMAL LOW (ref 34.8–46.6)
Hemoglobin: 10.9 g/dL — ABNORMAL LOW (ref 11.6–15.9)
Lymphocytes Relative: 16 %
Lymphs Abs: 1.6 10*3/uL (ref 0.9–3.3)
MCH: 28.6 pg (ref 25.1–34.0)
MCHC: 32.5 g/dL (ref 31.5–36.0)
MCV: 87.9 fL (ref 79.5–101.0)
MONO ABS: 0.9 10*3/uL (ref 0.1–0.9)
Monocytes Relative: 9 %
NEUTROS ABS: 7.3 10*3/uL — AB (ref 1.5–6.5)
Neutrophils Relative %: 73 %
PLATELETS: 175 10*3/uL (ref 145–400)
RBC: 3.81 MIL/uL (ref 3.70–5.45)
RDW: 16.7 % — AB (ref 11.2–14.5)
Smear Review: 2
WBC: 10 10*3/uL (ref 3.9–10.3)

## 2018-01-05 ENCOUNTER — Telehealth: Payer: Self-pay | Admitting: Emergency Medicine

## 2018-01-05 NOTE — Telephone Encounter (Addendum)
Left Vm regarding this note.   ----- Message from Ladell Pier, MD sent at 01/04/2018  6:13 PM EST ----- Please call patient, hemoglobin is stable, follow-up as scheduled

## 2018-01-06 ENCOUNTER — Telehealth: Payer: Self-pay

## 2018-01-06 DIAGNOSIS — D7581 Myelofibrosis: Secondary | ICD-10-CM

## 2018-01-06 MED ORDER — HYDROCODONE-ACETAMINOPHEN 5-325 MG PO TABS: 1.0000 | ORAL_TABLET | ORAL | 0 refills | Status: DC | PRN

## 2018-01-06 NOTE — Telephone Encounter (Signed)
Vicodin refill prescription paper available for pickup in clinc. Voiced understanding.

## 2018-01-07 ENCOUNTER — Other Ambulatory Visit: Payer: BLUE CROSS/BLUE SHIELD

## 2018-01-18 ENCOUNTER — Other Ambulatory Visit: Payer: Self-pay | Admitting: Radiology

## 2018-02-02 ENCOUNTER — Other Ambulatory Visit: Payer: Self-pay

## 2018-02-02 DIAGNOSIS — D7581 Myelofibrosis: Secondary | ICD-10-CM

## 2018-02-02 MED ORDER — HYDROCODONE-ACETAMINOPHEN 5-325 MG PO TABS: 1.0000 | ORAL_TABLET | ORAL | 0 refills | Status: DC | PRN

## 2018-02-02 NOTE — Telephone Encounter (Signed)
Informed pt that prescription refill is ready to pick up

## 2018-02-25 ENCOUNTER — Other Ambulatory Visit: Payer: Self-pay | Admitting: *Deleted

## 2018-02-25 DIAGNOSIS — D7581 Myelofibrosis: Secondary | ICD-10-CM

## 2018-02-25 MED ORDER — IBUPROFEN 800 MG PO TABS: 800.0000 mg | ORAL_TABLET | Freq: Three times a day (TID) | ORAL | 3 refills | Status: DC

## 2018-03-04 ENCOUNTER — Telehealth: Payer: Self-pay

## 2018-03-04 ENCOUNTER — Telehealth: Payer: Self-pay | Admitting: *Deleted

## 2018-03-04 DIAGNOSIS — D7581 Myelofibrosis: Secondary | ICD-10-CM

## 2018-03-04 MED ORDER — HYDROCODONE-ACETAMINOPHEN 5-325 MG PO TABS: 1.0000 | ORAL_TABLET | ORAL | 0 refills | Status: DC | PRN

## 2018-03-04 NOTE — Telephone Encounter (Signed)
Called to let pt know that pain medication prescription paper is ready for pickup. Pt voiced understanding

## 2018-03-04 NOTE — Telephone Encounter (Signed)
Voicemail received from patient requesting refill for Hydocodone.  Return number when ready for pick up is [phone].

## 2018-03-30 ENCOUNTER — Telehealth: Payer: Self-pay | Admitting: *Deleted

## 2018-03-30 NOTE — Telephone Encounter (Signed)
Voicemail received requesting "refill for Hydrocodone.  Return number when ready for pick up is [phone].  Message forwarded to collaborative for further assistance and patient communication.

## 2018-03-31 ENCOUNTER — Telehealth: Payer: Self-pay

## 2018-03-31 DIAGNOSIS — D7581 Myelofibrosis: Secondary | ICD-10-CM

## 2018-03-31 MED ORDER — HYDROCODONE-ACETAMINOPHEN 5-325 MG PO TABS: 1.0000 | ORAL_TABLET | ORAL | 0 refills | Status: DC | PRN

## 2018-03-31 NOTE — Telephone Encounter (Signed)
Informed that pain prescription paper is availbale to pick up

## 2018-04-07 ENCOUNTER — Ambulatory Visit: Payer: BLUE CROSS/BLUE SHIELD | Admitting: Oncology

## 2018-04-07 ENCOUNTER — Other Ambulatory Visit: Payer: BLUE CROSS/BLUE SHIELD

## 2018-04-14 ENCOUNTER — Telehealth: Payer: Self-pay | Admitting: Oncology

## 2018-04-14 NOTE — Telephone Encounter (Signed)
Called patient to confirm lab/fu 6/3. Appointments rescheduled from May due to new [facility] Clinic blocks. Spoke with patient and per patient request lab/fu moved to 7/5. Patient has new date/time.

## 2018-04-25 ENCOUNTER — Other Ambulatory Visit: Payer: BLUE CROSS/BLUE SHIELD

## 2018-04-25 ENCOUNTER — Ambulatory Visit: Payer: BLUE CROSS/BLUE SHIELD | Admitting: Oncology

## 2018-04-28 ENCOUNTER — Other Ambulatory Visit: Payer: Self-pay

## 2018-04-28 DIAGNOSIS — D7581 Myelofibrosis: Secondary | ICD-10-CM

## 2018-04-28 MED ORDER — HYDROCODONE-ACETAMINOPHEN 5-325 MG PO TABS: 1.0000 | ORAL_TABLET | ORAL | 0 refills | Status: DC | PRN

## 2018-04-28 NOTE — Progress Notes (Signed)
LVM to inform pt script paper ready for pickup

## 2018-05-19 ENCOUNTER — Other Ambulatory Visit: Payer: Self-pay

## 2018-05-19 DIAGNOSIS — D7581 Myelofibrosis: Secondary | ICD-10-CM

## 2018-05-19 MED ORDER — IBUPROFEN 800 MG PO TABS: 800.0000 mg | ORAL_TABLET | Freq: Three times a day (TID) | ORAL | 3 refills | Status: DC

## 2018-05-23 ENCOUNTER — Telehealth: Payer: Self-pay

## 2018-05-23 DIAGNOSIS — D7581 Myelofibrosis: Secondary | ICD-10-CM

## 2018-05-23 MED ORDER — HYDROCODONE-ACETAMINOPHEN 5-325 MG PO TABS: 1.0000 | ORAL_TABLET | ORAL | 0 refills | Status: DC | PRN

## 2018-05-23 NOTE — Telephone Encounter (Signed)
Informed pt that script paper is ready for pickup

## 2018-05-27 ENCOUNTER — Ambulatory Visit: Payer: BLUE CROSS/BLUE SHIELD | Admitting: Oncology

## 2018-05-27 ENCOUNTER — Other Ambulatory Visit: Payer: BLUE CROSS/BLUE SHIELD

## 2018-06-23 ENCOUNTER — Telehealth: Payer: Self-pay | Admitting: Medical Oncology

## 2018-06-23 NOTE — Telephone Encounter (Signed)
Refill request for vicodin.

## 2018-06-24 ENCOUNTER — Other Ambulatory Visit: Payer: Self-pay

## 2018-06-24 DIAGNOSIS — D7581 Myelofibrosis: Secondary | ICD-10-CM

## 2018-06-24 MED ORDER — HYDROCODONE-ACETAMINOPHEN 5-325 MG PO TABS: 1.0000 | ORAL_TABLET | ORAL | 0 refills | Status: DC | PRN

## 2018-06-24 NOTE — Progress Notes (Signed)
Called to inform pt that Vicodin prescription paper is availbe for pickup in office

## 2018-06-28 ENCOUNTER — Telehealth: Payer: Self-pay | Admitting: Oncology

## 2018-06-28 ENCOUNTER — Inpatient Hospital Stay: Payer: BLUE CROSS/BLUE SHIELD | Attending: Oncology

## 2018-06-28 ENCOUNTER — Inpatient Hospital Stay (HOSPITAL_BASED_OUTPATIENT_CLINIC_OR_DEPARTMENT_OTHER): Payer: BLUE CROSS/BLUE SHIELD | Admitting: Oncology

## 2018-06-28 VITALS — BP 116/69 | HR 74 | Temp 98.3°F | Resp 18 | Ht 69.0 in | Wt 159.2 lb

## 2018-06-28 DIAGNOSIS — R5383 Other fatigue: Secondary | ICD-10-CM

## 2018-06-28 DIAGNOSIS — Z853 Personal history of malignant neoplasm of breast: Secondary | ICD-10-CM

## 2018-06-28 DIAGNOSIS — D7581 Myelofibrosis: Secondary | ICD-10-CM

## 2018-06-28 DIAGNOSIS — R161 Splenomegaly, not elsewhere classified: Secondary | ICD-10-CM

## 2018-06-28 DIAGNOSIS — D649 Anemia, unspecified: Secondary | ICD-10-CM | POA: Diagnosis not present

## 2018-06-28 DIAGNOSIS — R5381 Other malaise: Secondary | ICD-10-CM | POA: Insufficient documentation

## 2018-06-28 DIAGNOSIS — R531 Weakness: Secondary | ICD-10-CM

## 2018-06-28 LAB — CBC WITH DIFFERENTIAL/PLATELET
Basophils Absolute: 0.1 10*3/uL (ref 0.0–0.1)
Basophils Relative: 1 %
Eosinophils Absolute: 0 10*3/uL (ref 0.0–0.5)
Eosinophils Relative: 1 %
HEMATOCRIT: 27.2 % — AB (ref 34.8–46.6)
Hemoglobin: 9.3 g/dL — ABNORMAL LOW (ref 11.6–15.9)
LYMPHS ABS: 1.5 10*3/uL (ref 0.9–3.3)
LYMPHS PCT: 16 %
MCH: 30.1 pg (ref 25.1–34.0)
MCHC: 34.1 g/dL (ref 31.5–36.0)
MCV: 88.5 fL (ref 79.5–101.0)
MONO ABS: 0.7 10*3/uL (ref 0.1–0.9)
MONOS PCT: 8 %
NEUTROS ABS: 7 10*3/uL — AB (ref 1.5–6.5)
Neutrophils Relative %: 74 %
Platelets: 184 10*3/uL (ref 145–400)
RBC: 3.07 MIL/uL — AB (ref 3.70–5.45)
RDW: 18.2 % — AB (ref 11.2–14.5)
WBC: 9.3 10*3/uL (ref 3.9–10.3)

## 2018-06-28 NOTE — Progress Notes (Signed)
  [place] OFFICE PROGRESS NOTE   Diagnosis: Myelofibrosis  INTERVAL HISTORY:   [name] takes approximately 2 ibuprofen tablets and 1 hydrocodone daily for relief of bone pain.  She notes splenomegaly, but denies abdominal pain and nausea.  Good appetite.  She reports fatigue for the past 2 days.  No fever or night sweats.  Objective:  Vital signs in last 24 hours:  Blood pressure 116/69, pulse 74, temperature 98.3 F (36.8 C), temperature source Oral, resp. rate 18, height '5\' 9"'$  (1.753 m), weight 159 lb 3.2 oz (72.2 kg), SpO2 100 %.    Resp: Lungs clear bilaterally Cardio: Regular rate and rhythm, 2/6 systolic murmur GI: No hepatomegaly, the spleen is enlarged and palpable to the iliac and extends across the midline Vascular: No leg edema   Lab Results:  Lab Results  Component Value Date   WBC 9.3 06/28/2018   HGB 9.3 (L) 06/28/2018   HCT 27.2 (L) 06/28/2018   MCV 88.5 06/28/2018   PLT 184 06/28/2018   NEUTROABS 7.0 (H) 06/28/2018    CMP  Lab Results  Component Value Date   NA 137 10/07/2017   K 3.7 10/07/2017   CL 103 11/07/2012   CO2 23 10/07/2017   GLUCOSE 100 10/07/2017   BUN 14.3 10/07/2017   CREATININE 0.8 10/07/2017   CALCIUM 9.3 10/07/2017   PROT 7.3 10/07/2017   ALBUMIN 4.2 10/07/2017   AST 22 10/07/2017   ALT 16 10/07/2017   ALKPHOS 65 10/07/2017   BILITOT 0.61 10/07/2017   GFRNONAA >90 11/07/2012   GFRAA >90 11/07/2012    Medications: I have reviewed the patient's current medications.   Assessment/Plan: 1. Stage II left-sided breast cancer diagnosed in February 2000. Screening mammogram 10/05/2011 with suggestion of a subcentimeter oval mass on the left superiorly and no suspicious masses, calcifications or architectural distortion in the right breast. She underwent biopsy of the "asymmetric density of concern in the superior aspect of the right breast" on 10/13/2011. Pathology showed a fibroadenoma.  2. 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  3. 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  Hydrea started at a dose of 500 mg daily on 12/07/2014  Discontinued in early 2017, resume July 2017, discontinued permanently 08/04/2016 4. Bone pain, likely a rheumatic manifestation of myelofibrosis. 5. History of intermittent low-grade fever and "night sweats," likely systemic manifestations of myelofibrosis. 6. History of Nose bleeding-likely related to myelofibrosis with dysfunctional platelets 7. 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  8. Elevated creatinine July 2015-normal 08/06/2014, potentially related to blood pressure medication  9.  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  The current malaise may be related to anemia.  She would like to continue observation for the myelofibrosis.  She agrees to a follow-up office visit and CBC in 3 months.  She will contact us for symptoms of anemia.  [name] does not wish to consider a JAK inhibitor at present.  I will recommend a referral to the leukemia or transplant service if the anemia progresses.  Betsy Coder, MD  06/28/2018  4:43 PM

## 2018-06-28 NOTE — Telephone Encounter (Signed)
Scheduled appt per 8/6 los - gave patient AVS and calender per los.

## 2018-07-22 ENCOUNTER — Other Ambulatory Visit: Payer: Self-pay | Admitting: Nurse Practitioner

## 2018-07-22 DIAGNOSIS — D7581 Myelofibrosis: Secondary | ICD-10-CM

## 2018-07-22 MED ORDER — HYDROCODONE-ACETAMINOPHEN 5-325 MG PO TABS: 1.0000 | ORAL_TABLET | ORAL | 0 refills | Status: DC | PRN

## 2018-08-22 ENCOUNTER — Other Ambulatory Visit: Payer: Self-pay | Admitting: *Deleted

## 2018-08-22 DIAGNOSIS — D7581 Myelofibrosis: Secondary | ICD-10-CM

## 2018-08-22 MED ORDER — HYDROCODONE-ACETAMINOPHEN 5-325 MG PO TABS: 1.0000 | ORAL_TABLET | ORAL | 0 refills | Status: DC | PRN

## 2018-08-23 ENCOUNTER — Other Ambulatory Visit: Payer: Self-pay | Admitting: Emergency Medicine

## 2018-08-23 DIAGNOSIS — D7581 Myelofibrosis: Secondary | ICD-10-CM

## 2018-08-23 MED ORDER — IBUPROFEN 800 MG PO TABS: 800.0000 mg | ORAL_TABLET | Freq: Three times a day (TID) | ORAL | 3 refills | Status: DC

## 2018-09-21 ENCOUNTER — Telehealth: Payer: Self-pay | Admitting: *Deleted

## 2018-09-21 NOTE — Telephone Encounter (Signed)
Patient called to request refill Hydrocodone. She states she would like to see if we can [facility].

## 2018-09-22 ENCOUNTER — Other Ambulatory Visit: Payer: Self-pay | Admitting: Nurse Practitioner

## 2018-09-22 DIAGNOSIS — D7581 Myelofibrosis: Secondary | ICD-10-CM

## 2018-09-22 MED ORDER — HYDROCODONE-ACETAMINOPHEN 5-325 MG PO TABS: 1.0000 | ORAL_TABLET | ORAL | 0 refills | Status: DC | PRN

## 2018-09-26 ENCOUNTER — Inpatient Hospital Stay: Payer: BLUE CROSS/BLUE SHIELD | Attending: Oncology | Admitting: Oncology

## 2018-09-26 ENCOUNTER — Inpatient Hospital Stay: Payer: BLUE CROSS/BLUE SHIELD

## 2018-09-26 VITALS — BP 144/81 | HR 72 | Temp 98.5°F | Resp 19 | Ht 69.0 in | Wt 159.4 lb

## 2018-09-26 DIAGNOSIS — Z853 Personal history of malignant neoplasm of breast: Secondary | ICD-10-CM | POA: Diagnosis not present

## 2018-09-26 DIAGNOSIS — D649 Anemia, unspecified: Secondary | ICD-10-CM | POA: Insufficient documentation

## 2018-09-26 DIAGNOSIS — M898X9 Other specified disorders of bone, unspecified site: Secondary | ICD-10-CM | POA: Diagnosis not present

## 2018-09-26 DIAGNOSIS — D7581 Myelofibrosis: Secondary | ICD-10-CM

## 2018-09-26 LAB — CMP (CANCER CENTER ONLY)
ALBUMIN: 4.1 g/dL (ref 3.5–5.0)
ALT: 15 U/L (ref 0–44)
AST: 19 U/L (ref 15–41)
Alkaline Phosphatase: 66 U/L (ref 38–126)
Anion gap: 7 (ref 5–15)
BUN: 17 mg/dL (ref 6–20)
CHLORIDE: 111 mmol/L (ref 98–111)
CO2: 19 mmol/L — ABNORMAL LOW (ref 22–32)
CREATININE: 0.99 mg/dL (ref 0.44–1.00)
Calcium: 9.1 mg/dL (ref 8.9–10.3)
GFR, Est AFR Am: >60 mL/min (ref >60)
Glucose, Bld: 92 mg/dL (ref 70–99)
Potassium: 3.9 mmol/L (ref 3.5–5.1)
Sodium: 137 mmol/L (ref 135–145)
Total Bilirubin: 0.3 mg/dL (ref 0.3–1.2)
Total Protein: 7.2 g/dL (ref 6.5–8.1)

## 2018-09-26 LAB — CBC WITH DIFFERENTIAL (CANCER CENTER ONLY)
ABS IMMATURE GRANULOCYTES: 1.97 10*3/uL — AB (ref 0.00–0.07)
Basophils Absolute: 0.1 10*3/uL (ref 0.0–0.1)
Basophils Relative: 1 %
EOS PCT: 1 %
Eosinophils Absolute: 0.1 10*3/uL (ref 0.0–0.5)
HEMATOCRIT: 30.9 % — AB (ref 36.0–46.0)
HEMOGLOBIN: 10 g/dL — AB (ref 12.0–15.0)
Immature Granulocytes: 20 %
LYMPHS ABS: 1.4 10*3/uL (ref 0.7–4.0)
LYMPHS PCT: 14 %
MCH: 29.6 pg (ref 26.0–34.0)
MCHC: 32.4 g/dL (ref 30.0–36.0)
MCV: 91.4 fL (ref 80.0–100.0)
Monocytes Absolute: 1.2 10*3/uL — ABNORMAL HIGH (ref 0.1–1.0)
Monocytes Relative: 12 %
Neutro Abs: 5.1 10*3/uL (ref 1.7–7.7)
Neutrophils Relative %: 52 %
Platelet Count: 170 10*3/uL (ref 150–400)
RBC: 3.38 MIL/uL — AB (ref 3.87–5.11)
RDW: 16.8 % — ABNORMAL HIGH (ref 11.5–15.5)
WBC: 9.8 10*3/uL (ref 4.0–10.5)
nRBC: 1 % — ABNORMAL HIGH (ref 0.0–0.2)

## 2018-09-26 LAB — LACTATE DEHYDROGENASE: LDH: 780 U/L — AB (ref 98–192)

## 2018-09-26 NOTE — Progress Notes (Signed)
[name] OFFICE PROGRESS NOTE   Diagnosis: Myelofibrosis, breast cancer  INTERVAL HISTORY:   [name] returns as scheduled.  She continues to have bone pain.  She takes approximately 2 hydrocodone and 2 ibuprofen tablets per day.  No other complaint.  Objective:  Vital signs in last 24 hours:  Blood pressure (!) 144/81, pulse 72, temperature 98.5 F (36.9 C), temperature source Oral, resp. rate 19, height _0  (1.753 m), weight 159 lb 6.4 oz (72.3 kg), SpO2 100 %.    HEENT: Neck without mass Lymphatics: No cervical, supraclavicular, or axillary nodes Resp: Lungs clear bilaterally Cardio: Regular rate and rhythm GI: The spleen is palpable throughout the left abdomen extending a few fingers below the umbilicus and one finger to the right of midline, no hepatomegaly Vascular: No leg edema Breasts: Right breast without mass, status post left mastectomy with an implant in place.  No evidence for chest wall tumor recurrence.    Lab Results:  Lab Results  Component Value Date   WBC 9.8 09/26/2018   HGB 10.0 (L) 09/26/2018   HCT 30.9 (L) 09/26/2018   MCV 91.4 09/26/2018   PLT 170 09/26/2018   NEUTROABS 5.1 09/26/2018    CMP  Lab Results  Component Value Date   NA 137 09/26/2018   K 3.9 09/26/2018   CL 111 09/26/2018   CO2 19 (L) 09/26/2018   GLUCOSE 92 09/26/2018   BUN 17 09/26/2018   CREATININE 0.99 09/26/2018   CALCIUM 9.1 09/26/2018   PROT 7.2 09/26/2018   ALBUMIN 4.1 09/26/2018   AST 19 09/26/2018   ALT 15 09/26/2018   ALKPHOS 66 09/26/2018   BILITOT 0.3 09/26/2018   GFRNONAA >60 09/26/2018   GFRAA >60 09/26/2018    Medications: I have reviewed the patient's current medications.   Assessment/Plan: 1. Stage II left-sided breast cancer diagnosed in February 2000. Screening mammogram 10/05/2011 with suggestion of a subcentimeter oval mass on the left superiorly and no suspicious masses, calcifications or architectural distortion in the  right breast. She underwent biopsy of the "asymmetric density of concern in the superior aspect of the right breast" on 10/13/2011. Pathology showed a fibroadenoma.  2. 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  3. 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  Hydrea started at a dose of 500 mg daily on 12/07/2014  Discontinued in early 2017, resume July 2017, discontinued permanently 08/04/2016 4. Bone pain, likely a rheumatic manifestation of myelofibrosis. 5. History of intermittent low-grade fever and "night sweats," likely systemic manifestations of myelofibrosis. 6. History of Nose bleeding-likely related to myelofibrosis with dysfunctional platelets 7. 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  8. Elevated creatinine July 2015-normal 08/06/2014, potentially related to blood pressure medication  9.  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 Heick is stable from a hematologic standpoint.  She continues hydrocodone and ibuprofen for bone pain.  She declines a transplant referral.  She will schedule a mammogram within the next few months.  She will return for an office visit and CBC in 6 months.  She will contact us in the interim for new symptoms.  15 minutes were spent with the patient today.  The majority of the time was used for counseling and coordination of care.    Betsy Coder, MD  09/26/2018  3:52 PM

## 2018-09-27 ENCOUNTER — Telehealth: Payer: Self-pay | Admitting: Oncology

## 2018-09-27 NOTE — Telephone Encounter (Signed)
Verified appointment with patient. °

## 2018-10-17 ENCOUNTER — Telehealth: Payer: Self-pay | Admitting: *Deleted

## 2018-10-17 ENCOUNTER — Other Ambulatory Visit: Payer: Self-pay | Admitting: Nurse Practitioner

## 2018-10-17 DIAGNOSIS — D7581 Myelofibrosis: Secondary | ICD-10-CM

## 2018-10-17 MED ORDER — HYDROCODONE-ACETAMINOPHEN 5-325 MG PO TABS: 1.0000 | ORAL_TABLET | ORAL | 0 refills | Status: DC | PRN

## 2018-10-17 NOTE — Telephone Encounter (Signed)
Called to request refill on Vicodin. Last fill was e-scribed to [facility] by Ned Card, NP.

## 2018-11-03 ENCOUNTER — Other Ambulatory Visit: Payer: Self-pay | Admitting: *Deleted

## 2018-11-03 DIAGNOSIS — D7581 Myelofibrosis: Secondary | ICD-10-CM

## 2018-11-03 MED ORDER — IBUPROFEN 800 MG PO TABS: 800.0000 mg | ORAL_TABLET | Freq: Three times a day (TID) | ORAL | 1 refills | Status: DC

## 2018-11-11 ENCOUNTER — Telehealth: Payer: Self-pay | Admitting: *Deleted

## 2018-11-11 DIAGNOSIS — D7581 Myelofibrosis: Secondary | ICD-10-CM

## 2018-11-11 MED ORDER — HYDROCODONE-ACETAMINOPHEN 5-325 MG PO TABS: 1.0000 | ORAL_TABLET | ORAL | 0 refills | Status: DC | PRN

## 2018-11-11 NOTE — Telephone Encounter (Signed)
Requesting refill on her hydrocodone. She can pick it up Monday, just needs it before Christmas.

## 2018-11-30 ENCOUNTER — Other Ambulatory Visit: Payer: Self-pay | Admitting: Nurse Practitioner

## 2018-11-30 ENCOUNTER — Telehealth: Payer: Self-pay | Admitting: *Deleted

## 2018-11-30 DIAGNOSIS — D7581 Myelofibrosis: Secondary | ICD-10-CM

## 2018-11-30 MED ORDER — OSELTAMIVIR PHOSPHATE 75 MG PO CAPS: 75.0000 mg | ORAL_CAPSULE | Freq: Every day | ORAL | 0 refills | Status: DC

## 2018-11-30 NOTE — Telephone Encounter (Signed)
Called to report her grandson has tested positive for the flu and she has been keeping him and is still doing so. Asking if Dr. Benay Spice will order Tamiflu (oseltamivir) for her to take since here immune system is compromised. Currently denies any fever or s/s of the flu. 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 States she does not have a PCP, that Dr. Benay Spice is her PCP.

## 2018-11-30 NOTE — Telephone Encounter (Signed)
Called patient and informed her script for tamiflu prophylaxis dose was sent to her pharmacy. Reviewed most common adverse effects from tamiflu with her and strongly encouraged her to get a PCP. She understands and says she will work on this.  Instructed her to use good handwashing, wash dishes in very hot water and try to find someone else to watch her grandson if possible.

## 2018-12-19 ENCOUNTER — Telehealth: Payer: Self-pay | Admitting: *Deleted

## 2018-12-19 NOTE — Telephone Encounter (Signed)
Left VM requesting [facility] [place].

## 2018-12-20 ENCOUNTER — Other Ambulatory Visit: Payer: Self-pay | Admitting: Nurse Practitioner

## 2018-12-20 DIAGNOSIS — D7581 Myelofibrosis: Secondary | ICD-10-CM

## 2018-12-20 MED ORDER — HYDROCODONE-ACETAMINOPHEN 5-325 MG PO TABS: 1.0000 | ORAL_TABLET | ORAL | 0 refills | Status: DC | PRN

## 2019-01-17 ENCOUNTER — Other Ambulatory Visit: Payer: Self-pay | Admitting: *Deleted

## 2019-01-17 DIAGNOSIS — D7581 Myelofibrosis: Secondary | ICD-10-CM

## 2019-01-17 MED ORDER — IBUPROFEN 800 MG PO TABS: 800.0000 mg | ORAL_TABLET | Freq: Three times a day (TID) | ORAL | 2 refills | Status: DC

## 2019-01-20 ENCOUNTER — Other Ambulatory Visit: Payer: Self-pay | Admitting: Oncology

## 2019-01-20 DIAGNOSIS — D7581 Myelofibrosis: Secondary | ICD-10-CM

## 2019-01-20 MED ORDER — HYDROCODONE-ACETAMINOPHEN 5-325 MG PO TABS: 1.0000 | ORAL_TABLET | ORAL | 0 refills | Status: DC | PRN

## 2019-01-23 ENCOUNTER — Encounter: Payer: Self-pay | Admitting: Oncology

## 2019-02-15 ENCOUNTER — Telehealth: Payer: Self-pay | Admitting: *Deleted

## 2019-02-15 ENCOUNTER — Other Ambulatory Visit: Payer: Self-pay | Admitting: Nurse Practitioner

## 2019-02-15 DIAGNOSIS — D7581 Myelofibrosis: Secondary | ICD-10-CM

## 2019-02-15 MED ORDER — HYDROCODONE-ACETAMINOPHEN 5-325 MG PO TABS: 1.0000 | ORAL_TABLET | ORAL | 0 refills | Status: DC | PRN

## 2019-02-15 NOTE — Telephone Encounter (Signed)
Called for Vicodin refill. "I'll be out today". MD notified

## 2019-03-15 ENCOUNTER — Other Ambulatory Visit: Payer: Self-pay | Admitting: Nurse Practitioner

## 2019-03-15 DIAGNOSIS — D7581 Myelofibrosis: Secondary | ICD-10-CM

## 2019-03-15 MED ORDER — HYDROCODONE-ACETAMINOPHEN 5-325 MG PO TABS: 1.0000 | ORAL_TABLET | ORAL | 0 refills | Status: DC | PRN

## 2019-03-21 ENCOUNTER — Telehealth: Payer: Self-pay | Admitting: Oncology

## 2019-03-21 NOTE — Telephone Encounter (Signed)
Returned patient's phone call regarding rescheduling an appointment, per patient's request due to concerns of COVID-19 the appointment has been moved from may to July.  Message to provider.

## 2019-03-27 ENCOUNTER — Ambulatory Visit: Payer: BLUE CROSS/BLUE SHIELD | Admitting: Oncology

## 2019-03-27 ENCOUNTER — Other Ambulatory Visit: Payer: BLUE CROSS/BLUE SHIELD

## 2019-04-11 ENCOUNTER — Other Ambulatory Visit: Payer: Self-pay | Admitting: Oncology

## 2019-04-11 DIAGNOSIS — D7581 Myelofibrosis: Secondary | ICD-10-CM

## 2019-04-14 ENCOUNTER — Other Ambulatory Visit: Payer: Self-pay | Admitting: Nurse Practitioner

## 2019-04-14 DIAGNOSIS — D7581 Myelofibrosis: Secondary | ICD-10-CM

## 2019-04-14 MED ORDER — HYDROCODONE-ACETAMINOPHEN 5-325 MG PO TABS: 1.0000 | ORAL_TABLET | ORAL | 0 refills | Status: DC | PRN

## 2019-05-12 ENCOUNTER — Other Ambulatory Visit: Payer: Self-pay | Admitting: Nurse Practitioner

## 2019-05-12 DIAGNOSIS — D7581 Myelofibrosis: Secondary | ICD-10-CM

## 2019-05-12 MED ORDER — HYDROCODONE-ACETAMINOPHEN 5-325 MG PO TABS: 1.0000 | ORAL_TABLET | ORAL | 0 refills | Status: DC | PRN

## 2019-05-30 ENCOUNTER — Telehealth: Payer: Self-pay | Admitting: Oncology

## 2019-05-30 ENCOUNTER — Inpatient Hospital Stay (HOSPITAL_BASED_OUTPATIENT_CLINIC_OR_DEPARTMENT_OTHER): Payer: BC Managed Care – PPO | Admitting: Oncology

## 2019-05-30 ENCOUNTER — Inpatient Hospital Stay: Payer: BC Managed Care – PPO | Attending: Oncology

## 2019-05-30 ENCOUNTER — Other Ambulatory Visit: Payer: Self-pay

## 2019-05-30 VITALS — BP 137/73 | HR 73 | Temp 97.8°F | Resp 17 | Ht 69.0 in | Wt 159.2 lb

## 2019-05-30 DIAGNOSIS — M545 Low back pain: Secondary | ICD-10-CM

## 2019-05-30 DIAGNOSIS — M898X9 Other specified disorders of bone, unspecified site: Secondary | ICD-10-CM | POA: Diagnosis not present

## 2019-05-30 DIAGNOSIS — D630 Anemia in neoplastic disease: Secondary | ICD-10-CM

## 2019-05-30 DIAGNOSIS — D7581 Myelofibrosis: Secondary | ICD-10-CM | POA: Insufficient documentation

## 2019-05-30 DIAGNOSIS — Z853 Personal history of malignant neoplasm of breast: Secondary | ICD-10-CM | POA: Insufficient documentation

## 2019-05-30 LAB — CBC WITH DIFFERENTIAL (CANCER CENTER ONLY)
Abs Immature Granulocytes: 1.36 10*3/uL — ABNORMAL HIGH (ref 0.00–0.07)
Basophils Absolute: 0.1 10*3/uL (ref 0.0–0.1)
Basophils Relative: 1 %
Eosinophils Absolute: 0 10*3/uL (ref 0.0–0.5)
Eosinophils Relative: 0 %
HCT: 30.4 % — ABNORMAL LOW (ref 36.0–46.0)
Hemoglobin: 9.6 g/dL — ABNORMAL LOW (ref 12.0–15.0)
Immature Granulocytes: 15 %
Lymphocytes Relative: 13 %
Lymphs Abs: 1.1 10*3/uL (ref 0.7–4.0)
MCH: 29.1 pg (ref 26.0–34.0)
MCHC: 31.6 g/dL (ref 30.0–36.0)
MCV: 92.1 fL (ref 80.0–100.0)
Monocytes Absolute: 1 10*3/uL (ref 0.1–1.0)
Monocytes Relative: 11 %
Neutro Abs: 5.3 10*3/uL (ref 1.7–7.7)
Neutrophils Relative %: 60 %
Platelet Count: 181 10*3/uL (ref 150–400)
RBC: 3.3 MIL/uL — ABNORMAL LOW (ref 3.87–5.11)
RDW: 17 % — ABNORMAL HIGH (ref 11.5–15.5)
WBC Count: 8.8 10*3/uL (ref 4.0–10.5)
nRBC: 1.8 % — ABNORMAL HIGH (ref 0.0–0.2)

## 2019-05-30 NOTE — Progress Notes (Signed)
[name]   Diagnosis: Myelofibrosis  INTERVAL HISTORY:   [name] returns for a scheduled visit.  She reports increased malaise.  No nausea.  No change over the chest wall.  She has noted low back pain for the past 6 months.  This is worse in the morning and evenings.  This is different from the chronic diffuse bone pain.  She takes hydrocodone for the back pain and diffuse pain.  She also takes ibuprofen twice daily. No neurologic symptoms.  No difficulty with bowel or bladder function.  Objective:  Vital signs in last 24 hours:  Blood pressure 137/73, pulse 73, temperature 97.8 F (36.6 C), temperature source Oral, resp. rate 17, height _0  (1.753 m), weight 159 lb 3.2 oz (72.2 kg), SpO2 100 %.   Limited physical examination secondary to distancing with the COVID pandemic HEENT: Neck without mass Lymphatics: No cervical or supraclavicular nodes GI: The spleen is palpable throughout the left abdomen and extends below the umbilicus.  No hepatomegaly Vascular: No leg edema Musculoskeletal: Mild tenderness at the lower lumbar spine, upper sacrum, and bilateral posterior iliac, no mass or rash   Lab Results:  Lab Results  Component Value Date   WBC 8.8 05/30/2019   HGB 9.6 (L) 05/30/2019   HCT 30.4 (L) 05/30/2019   MCV 92.1 05/30/2019   PLT 181 05/30/2019   NEUTROABS 5.3 05/30/2019    CMP  Lab Results  Component Value Date   NA 137 09/26/2018   K 3.9 09/26/2018   CL 111 09/26/2018   CO2 19 (L) 09/26/2018   GLUCOSE 92 09/26/2018   BUN 17 09/26/2018   CREATININE 0.99 09/26/2018   CALCIUM 9.1 09/26/2018   PROT 7.2 09/26/2018   ALBUMIN 4.1 09/26/2018   AST 19 09/26/2018   ALT 15 09/26/2018   ALKPHOS 66 09/26/2018   BILITOT 0.3 09/26/2018   GFRNONAA >60 09/26/2018   GFRAA >60 09/26/2018     Medications: I have reviewed the patient's current medications.   Assessment/Plan: 1. Stage II left-sided breast cancer  diagnosed in February 2000. Screening mammogram 10/05/2011 with suggestion of a subcentimeter oval mass on the left superiorly and no suspicious masses, calcifications or architectural distortion in the right breast. She underwent biopsy of the "asymmetric density of concern in the superior aspect of the right breast" on 10/13/2011. Pathology showed a fibroadenoma.  2. 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  3. 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  Hydrea started at a dose of 500 mg daily on 12/07/2014  Discontinued in early 2017, resume July 2017, discontinued permanently 08/04/2016 4. Bone pain, likely a rheumatic manifestation of myelofibrosis. 5. History of intermittent low-grade fever and "night sweats," likely systemic manifestations of myelofibrosis. 6. History of Nose bleeding-likely related to myelofibrosis with dysfunctional platelets 7. 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  8. Elevated creatinine July 2015-normal 08/06/2014, potentially related to blood pressure medication  9.  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  I suspect the low back pain is related to myelofibrosis, but other etiologies are possible.  We discussed the differential diagnosis.  She agrees to a lumbar and sacral MRI.  This will be scheduled for within the next few weeks.  She agrees to consider treatment  with a Jak-2 inhibitor.  It is possible this would improve her pain.  She will return for an office visit in approximately 3 weeks.  Betsy Coder, MD  05/30/2019  3:49 PM

## 2019-05-30 NOTE — Telephone Encounter (Signed)
Gave avs and calendar ° °

## 2019-06-06 ENCOUNTER — Telehealth: Payer: Self-pay | Admitting: *Deleted

## 2019-06-06 NOTE — Telephone Encounter (Signed)
Notified patient of spine MRI on 06/26/2019 at [address] location of [facility]. Arrive at 5 pm for 5:20 scan. No diet restrictions.

## 2019-06-08 ENCOUNTER — Other Ambulatory Visit: Payer: Self-pay | Admitting: Oncology

## 2019-06-08 DIAGNOSIS — D7581 Myelofibrosis: Secondary | ICD-10-CM

## 2019-06-08 MED ORDER — HYDROCODONE-ACETAMINOPHEN 5-325 MG PO TABS: 1.0000 | ORAL_TABLET | ORAL | 0 refills | Status: DC | PRN

## 2019-06-16 ENCOUNTER — Telehealth: Payer: Self-pay | Admitting: Nurse Practitioner

## 2019-06-16 NOTE — Telephone Encounter (Signed)
PATIENT called to cancel

## 2019-06-20 ENCOUNTER — Ambulatory Visit: Payer: BC Managed Care – PPO | Admitting: Nurse Practitioner

## 2019-06-21 ENCOUNTER — Other Ambulatory Visit: Payer: Self-pay | Admitting: Oncology

## 2019-06-21 DIAGNOSIS — D7581 Myelofibrosis: Secondary | ICD-10-CM

## 2019-06-26 ENCOUNTER — Ambulatory Visit
Admission: RE | Admit: 2019-06-26 | Discharge: 2019-06-26 | Disposition: A | Discharge status: Home / Self Care | Payer: BC Managed Care – PPO | Source: Ambulatory Visit | Attending: Oncology | Admitting: Oncology

## 2019-06-26 DIAGNOSIS — D7581 Myelofibrosis: Secondary | ICD-10-CM

## 2019-06-27 ENCOUNTER — Other Ambulatory Visit: Payer: Self-pay | Admitting: Oncology

## 2019-06-27 ENCOUNTER — Telehealth: Payer: Self-pay | Admitting: Oncology

## 2019-06-27 DIAGNOSIS — D7581 Myelofibrosis: Secondary | ICD-10-CM

## 2019-06-27 NOTE — Telephone Encounter (Signed)
Scheduled appt per 8/04 sch messa e- pt aware of appt date and time

## 2019-07-03 ENCOUNTER — Other Ambulatory Visit: Payer: Self-pay | Admitting: Oncology

## 2019-07-03 DIAGNOSIS — D7581 Myelofibrosis: Secondary | ICD-10-CM

## 2019-07-03 MED ORDER — HYDROCODONE-ACETAMINOPHEN 5-325 MG PO TABS: 1.0000 | ORAL_TABLET | ORAL | 0 refills | Status: DC | PRN

## 2019-07-04 ENCOUNTER — Other Ambulatory Visit: Payer: Self-pay

## 2019-07-04 ENCOUNTER — Encounter (HOSPITAL_COMMUNITY)
Admission: RE | Admit: 2019-07-04 | Discharge: 2019-07-04 | Disposition: A | Discharge status: Home / Self Care | Payer: BC Managed Care – PPO | Source: Ambulatory Visit | Attending: Oncology | Admitting: Oncology

## 2019-07-04 DIAGNOSIS — D7581 Myelofibrosis: Secondary | ICD-10-CM | POA: Diagnosis not present

## 2019-07-04 MED ORDER — TECHNETIUM TC 99M MEDRONATE IV KIT
20.0000 | PACK | Freq: Once | INTRAVENOUS | Status: AC | PRN
  Administered 2019-07-04: 09:00:00 20 via INTRAVENOUS

## 2019-07-06 NOTE — Telephone Encounter (Signed)
-----   Message from Ladell Pier, MD sent at 07/05/2019  2:11 PM EDT ----- Please call patient, bone scan shows no evidence of cancer in the areas in question on the MRI, call for increased pain, follow-up as scheduled

## 2019-07-06 NOTE — Telephone Encounter (Signed)
Notified of bone scan results and f/u as scheduled.

## 2019-07-11 ENCOUNTER — Inpatient Hospital Stay: Payer: BC Managed Care – PPO

## 2019-07-11 ENCOUNTER — Other Ambulatory Visit: Payer: Self-pay

## 2019-07-11 ENCOUNTER — Telehealth: Payer: Self-pay | Admitting: Oncology

## 2019-07-11 ENCOUNTER — Inpatient Hospital Stay: Payer: BC Managed Care – PPO | Attending: Oncology | Admitting: Oncology

## 2019-07-11 VITALS — BP 138/70 | HR 76 | Temp 98.2°F | Resp 18 | Ht 69.0 in | Wt 161.0 lb

## 2019-07-11 DIAGNOSIS — D630 Anemia in neoplastic disease: Secondary | ICD-10-CM | POA: Insufficient documentation

## 2019-07-11 DIAGNOSIS — R161 Splenomegaly, not elsewhere classified: Secondary | ICD-10-CM | POA: Diagnosis not present

## 2019-07-11 DIAGNOSIS — R5381 Other malaise: Secondary | ICD-10-CM | POA: Diagnosis not present

## 2019-07-11 DIAGNOSIS — D7581 Myelofibrosis: Secondary | ICD-10-CM

## 2019-07-11 LAB — CMP (CANCER CENTER ONLY)
ALT: 12 U/L (ref 0–44)
AST: 19 U/L (ref 15–41)
Albumin: 4.2 g/dL (ref 3.5–5.0)
Alkaline Phosphatase: 69 U/L (ref 38–126)
Anion gap: 12 (ref 5–15)
BUN: 15 mg/dL (ref 6–20)
CO2: 20 mmol/L — ABNORMAL LOW (ref 22–32)
Calcium: 8.8 mg/dL — ABNORMAL LOW (ref 8.9–10.3)
Chloride: 107 mmol/L (ref 98–111)
Creatinine: 0.79 mg/dL (ref 0.44–1.00)
GFR, Est AFR Am: >60 mL/min (ref >60)
GFR, Estimated: >60 mL/min (ref >60)
Glucose, Bld: 106 mg/dL — ABNORMAL HIGH (ref 70–99)
Potassium: 3.8 mmol/L (ref 3.5–5.1)
Sodium: 139 mmol/L (ref 135–145)
Total Bilirubin: 0.4 mg/dL (ref 0.3–1.2)
Total Protein: 7.1 g/dL (ref 6.5–8.1)

## 2019-07-11 LAB — CBC WITH DIFFERENTIAL (CANCER CENTER ONLY)
Abs Immature Granulocytes: 1.42 10*3/uL — ABNORMAL HIGH (ref 0.00–0.07)
Basophils Absolute: 0.1 10*3/uL (ref 0.0–0.1)
Basophils Relative: 1 %
Eosinophils Absolute: 0 10*3/uL (ref 0.0–0.5)
Eosinophils Relative: 1 %
HCT: 30.8 % — ABNORMAL LOW (ref 36.0–46.0)
Hemoglobin: 9.9 g/dL — ABNORMAL LOW (ref 12.0–15.0)
Immature Granulocytes: 16 %
Lymphocytes Relative: 15 %
Lymphs Abs: 1.3 10*3/uL (ref 0.7–4.0)
MCH: 29.1 pg (ref 26.0–34.0)
MCHC: 32.1 g/dL (ref 30.0–36.0)
MCV: 90.6 fL (ref 80.0–100.0)
Monocytes Absolute: 0.9 10*3/uL (ref 0.1–1.0)
Monocytes Relative: 10 %
Neutro Abs: 5 10*3/uL (ref 1.7–7.7)
Neutrophils Relative %: 57 %
Platelet Count: 211 10*3/uL (ref 150–400)
RBC: 3.4 MIL/uL — ABNORMAL LOW (ref 3.87–5.11)
RDW: 16.4 % — ABNORMAL HIGH (ref 11.5–15.5)
WBC Count: 8.7 10*3/uL (ref 4.0–10.5)
nRBC: 1.4 % — ABNORMAL HIGH (ref 0.0–0.2)

## 2019-07-11 LAB — LACTATE DEHYDROGENASE: LDH: 921 U/L — ABNORMAL HIGH (ref 98–192)

## 2019-07-11 NOTE — Progress Notes (Signed)
[name]   Diagnosis: Myelofibrosis  INTERVAL HISTORY:   [name] returns as scheduled.  She continues to have diffuse bone pain with increased pain at the lower back.  She takes Tylenol, ibuprofen, and hydrocodone for relief of pain.  She has increased malaise.  No nausea, fever, or night sweats.  Good appetite.  No leg weakness or difficulty with bowel/bladder control.  Objective:  Vital signs in last 24 hours:  Blood pressure 138/70, pulse 76, temperature 98.2 F (36.8 C), temperature source Oral, resp. rate 18, height 5' 9"  (1.753 m), weight 161 lb (73 kg), SpO2 100 %.    Resp: Lungs clear bilaterally Cardio: Regular rate and rhythm GI: No hepatomegaly, the spleen is palpable throughout the left abdomen and extending below the umbilicus Vascular: No leg edema      Lab Results:  Lab Results  Component Value Date   WBC 8.7 07/11/2019   HGB 9.9 (L) 07/11/2019   HCT 30.8 (L) 07/11/2019   MCV 90.6 07/11/2019   PLT 211 07/11/2019   NEUTROABS 5.0 07/11/2019    CMP  Lab Results  Component Value Date   NA 139 07/11/2019   K 3.8 07/11/2019   CL 107 07/11/2019   CO2 20 (L) 07/11/2019   GLUCOSE 106 (H) 07/11/2019   BUN 15 07/11/2019   CREATININE 0.79 07/11/2019   CALCIUM 8.8 (L) 07/11/2019   PROT 7.1 07/11/2019   ALBUMIN 4.2 07/11/2019   AST 19 07/11/2019   ALT 12 07/11/2019   ALKPHOS 69 07/11/2019   BILITOT 0.4 07/11/2019   GFRNONAA >60 07/11/2019   GFRAA >60 07/11/2019     Medications: I have reviewed the patient's current medications.   Assessment/Plan:  1. Stage II left-sided breast cancer diagnosed in February 2000. Screening mammogram 10/05/2011 with suggestion of a subcentimeter oval mass on the left superiorly and no suspicious masses, calcifications or architectural distortion in the right breast. She underwent biopsy of the "asymmetric density of concern in the superior aspect of the right breast" on  10/13/2011. Pathology showed a fibroadenoma.  2. 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  3. 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  Hydrea started at a dose of 500 mg daily on 12/07/2014  Discontinued in early 2017, resume July 2017, discontinued permanently 08/04/2016 4. Bone pain, likely a rheumatic manifestation of myelofibrosis. 5. History of intermittent low-grade fever and "night sweats," likely systemic manifestations of myelofibrosis. 6. History of Nose bleeding-likely related to myelofibrosis with dysfunctional platelets 7. 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  8. Elevated creatinine July 2015-normal 08/06/2014, potentially related to blood pressure medication  9.  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  Increased low back pain July 2020  MRI lumbar spine 06/26/2019- diffuse T1 signal compatible with myelofibrosis, hyperintense T2 signal at T12 and L3, most likely hemangiomas  Bone scan 07/04/2019- diffuse increased and homogenous uptake compatible with myelofibrosis, no focal increased activity at T12 or L3   Disposition: [name] has myelofibrosis.  I suspect her symptoms including malaise and pain are related to myelofibrosis.  I have a low clinical suspicion for metastatic breast cancer.  She would like to begin a trial of [place].  She understands the goal is to improve her pain, splenomegaly, and malaise.  There will likely be no improvement in the  anemia.  We reviewed potential toxicities associated with Winferd Humphrey including the chance of hematologic and liver toxicity.  She will be contacted by the Cancer center pharmacist for further discussion.  The plan is to begin treatment during the week of 07/17/2019.  She will return for an office and lab visit on  08/03/2019.   Betsy Coder, MD  07/11/2019  5:11 PM

## 2019-07-11 NOTE — Telephone Encounter (Signed)
Gave patient avs report and appointments and September.

## 2019-07-13 ENCOUNTER — Telehealth: Payer: Self-pay

## 2019-07-13 ENCOUNTER — Telehealth: Payer: Self-pay | Admitting: Pharmacist

## 2019-07-13 DIAGNOSIS — D7581 Myelofibrosis: Secondary | ICD-10-CM

## 2019-07-13 MED ORDER — RUXOLITINIB PHOSPHATE 15 MG PO TABS: 15.0000 mg | ORAL_TABLET | Freq: Two times a day (BID) | ORAL | 0 refills | Status: DC

## 2019-07-13 NOTE — Telephone Encounter (Signed)
Oral Oncology Patient Advocate Encounter  Received notification from [facility] that prior authorization for Shanon Brow is required.  PA submitted on CoverMyMeds Key [id number] Status is pending  Oral Oncology Clinic will continue to follow.  [place] Patient [place] Phone [phone] Fax [phone] 07/13/2019    12:19 PM

## 2019-07-13 NOTE — Telephone Encounter (Signed)
Oral Oncology Patient Advocate Encounter  Prior Authorization for [name] has been approved.    PA# [id number] Effective dates: 07/13/19 through 07/11/20  Oral Oncology Clinic will continue to follow.   [name] Phone [phone] Fax [phone] 07/13/2019    12:20 PM

## 2019-07-13 NOTE — Telephone Encounter (Addendum)
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  Patient has received treatment with hydroxyurea in the past. Patient has been prescribed Jakafi 15 mg by mouth twice daily and is appropriate (based on renal function and platelet count > 200k) for 20 mg twice daily  Labs from 07/11/19 assessed, OK for treatment initiation. SCr=0.79, round to 0.8 per institutional policy for patients age < 2, est CrCl ~ 90 mL/min  Current medication list in Epic reviewed, no DDIs with Jakafi identified.  Prescription has been e-scribed to the [facility] for benefits analysis and approval.  Oral Oncology Clinic will continue to follow for insurance authorization, copayment issues, initial counseling and start date.  Johny Drilling, PharmD, BCPS, BCOP  07/13/2019 8:19 AM Oral Oncology Clinic [phone]

## 2019-07-14 ENCOUNTER — Ambulatory Visit: Payer: BC Managed Care – PPO | Admitting: Oncology

## 2019-07-14 ENCOUNTER — Telehealth: Payer: Self-pay

## 2019-07-14 NOTE — Telephone Encounter (Signed)
Oral Oncology Patient Advocate Encounter  Jakafi copay is $35.66.  I was able to obtain a copay card for the patient making her out of pocket cost $0. The copay card information is as follows and has been shared with [facility].  BIN: [id number] Grp: [id number] ID: [id number]  [place] Patient [place] Phone [phone] Fax [phone] 07/14/2019   9:34 AM

## 2019-07-14 NOTE — Telephone Encounter (Signed)
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   Counseled patient on administration, dosing, side effects, monitoring, drug-food interactions, safe handling, storage, and disposal.  Patient will take Jakafi 15mg  tablets, 1 tablet by mouth 2 times daily, with or without food.  Patient knows to avoid grapefruit and grapefruit juice.  Jakafi start date: 07/19/19  Adverse effects include but are not limited to: decreased blood counts, increased lipid profile, increased cholesterol, increased liver enzymes, dizziness, headache, diarrhea, increased risk of some infections (including bacterial, mycobacterial, fungal, or viral), and increased risk of non-melanoma skin cancers.    Reviewed with patient importance of keeping a medication schedule and plan for any missed doses.  Medication reconciliation performed and medication/allergy list updated.  Insurance authorization for [name] has been obtained. BCBS is mandating Jakafi be filled in 15-day supply increments for the 1st 1-3 months of therapy.  Test claim at the pharmacy revealed copayment $55 for 1st fill of Jakafi. Oral oncology patient advocate will sign patient up for a manufacturer copayment coupon to reduce patient's out of pocket cost to $0 per fill.  This will ship from the [facility] on 07/17/19 to deliver to patient's home on 8/25. She will start on Wednesday 07/19/19  Patient informed the pharmacy will reach out 5-7 days prior to needing next fill of Jakafi to coordinate continued medication acquisition to prevent break in therapy.  All questions answered.  [name] voiced understanding and appreciation.   Patient knows to call the office with questions or concerns.  Johny Drilling, PharmD, BCPS, BCOP  07/14/2019    9:08 AM Oral Oncology Clinic [phone]

## 2019-07-17 ENCOUNTER — Ambulatory Visit: Payer: BC Managed Care – PPO | Admitting: Oncology

## 2019-07-17 MED FILL — JAKAFI 15 MG TABLET: 15 | 15 days supply | Qty: 30 | Fill #0

## 2019-07-21 NOTE — Telephone Encounter (Signed)
Oral Oncology Patient Advocate Encounter  Confirmed with Renick that [name] was shipped on 8/24 with a $0 copay using a copay card.   [place] Patient [name] Phone [phone] Fax [phone] 07/21/2019   2:35 PM

## 2019-07-26 ENCOUNTER — Other Ambulatory Visit: Payer: Self-pay | Admitting: Oncology

## 2019-07-26 DIAGNOSIS — D7581 Myelofibrosis: Secondary | ICD-10-CM

## 2019-07-26 MED ORDER — HYDROCODONE-ACETAMINOPHEN 5-325 MG PO TABS: 1.0000 | ORAL_TABLET | ORAL | 0 refills | Status: DC | PRN

## 2019-08-01 MED FILL — JAKAFI 15 MG TABLET: 15 | 15 days supply | Qty: 30 | Fill #1

## 2019-08-03 ENCOUNTER — Inpatient Hospital Stay: Payer: BC Managed Care – PPO

## 2019-08-03 ENCOUNTER — Inpatient Hospital Stay: Payer: BC Managed Care – PPO | Attending: Oncology | Admitting: Oncology

## 2019-08-03 ENCOUNTER — Other Ambulatory Visit: Payer: Self-pay

## 2019-08-03 VITALS — BP 148/66 | HR 71 | Temp 98.5°F | Resp 17 | Ht 69.0 in | Wt 158.9 lb

## 2019-08-03 DIAGNOSIS — R161 Splenomegaly, not elsewhere classified: Secondary | ICD-10-CM | POA: Diagnosis not present

## 2019-08-03 DIAGNOSIS — M545 Low back pain: Secondary | ICD-10-CM | POA: Insufficient documentation

## 2019-08-03 DIAGNOSIS — Z79899 Other long term (current) drug therapy: Secondary | ICD-10-CM | POA: Diagnosis not present

## 2019-08-03 DIAGNOSIS — D7581 Myelofibrosis: Secondary | ICD-10-CM

## 2019-08-03 DIAGNOSIS — Z853 Personal history of malignant neoplasm of breast: Secondary | ICD-10-CM | POA: Insufficient documentation

## 2019-08-03 LAB — CBC WITH DIFFERENTIAL (CANCER CENTER ONLY)
Abs Immature Granulocytes: 1.74 10*3/uL — ABNORMAL HIGH (ref 0.00–0.07)
Basophils Absolute: 0.1 10*3/uL (ref 0.0–0.1)
Basophils Relative: 1 %
Eosinophils Absolute: 0.1 10*3/uL (ref 0.0–0.5)
Eosinophils Relative: 1 %
HCT: 32.7 % — ABNORMAL LOW (ref 36.0–46.0)
Hemoglobin: 10.7 g/dL — ABNORMAL LOW (ref 12.0–15.0)
Immature Granulocytes: 16 %
Lymphocytes Relative: 21 %
Lymphs Abs: 2.3 10*3/uL (ref 0.7–4.0)
MCH: 28.7 pg (ref 26.0–34.0)
MCHC: 32.7 g/dL (ref 30.0–36.0)
MCV: 87.7 fL (ref 80.0–100.0)
Monocytes Absolute: 1.2 10*3/uL — ABNORMAL HIGH (ref 0.1–1.0)
Monocytes Relative: 11 %
Neutro Abs: 5.5 10*3/uL (ref 1.7–7.7)
Neutrophils Relative %: 50 %
Platelet Count: 109 10*3/uL — ABNORMAL LOW (ref 150–400)
RBC: 3.73 MIL/uL — ABNORMAL LOW (ref 3.87–5.11)
RDW: 15.6 % — ABNORMAL HIGH (ref 11.5–15.5)
WBC Count: 10.9 10*3/uL — ABNORMAL HIGH (ref 4.0–10.5)
nRBC: 2 % — ABNORMAL HIGH (ref 0.0–0.2)

## 2019-08-03 LAB — CMP (CANCER CENTER ONLY)
ALT: 39 U/L (ref 0–44)
AST: 25 U/L (ref 15–41)
Albumin: 4.6 g/dL (ref 3.5–5.0)
Alkaline Phosphatase: 88 U/L (ref 38–126)
Anion gap: 7 (ref 5–15)
BUN: 18 mg/dL (ref 6–20)
CO2: 25 mmol/L (ref 22–32)
Calcium: 8.7 mg/dL — ABNORMAL LOW (ref 8.9–10.3)
Chloride: 108 mmol/L (ref 98–111)
Creatinine: 0.82 mg/dL (ref 0.44–1.00)
GFR, Est AFR Am: >60 mL/min (ref >60)
GFR, Estimated: >60 mL/min (ref >60)
Glucose, Bld: 82 mg/dL (ref 70–99)
Potassium: 4 mmol/L (ref 3.5–5.1)
Sodium: 140 mmol/L (ref 135–145)
Total Bilirubin: 0.4 mg/dL (ref 0.3–1.2)
Total Protein: 7 g/dL (ref 6.5–8.1)

## 2019-08-03 LAB — LIPID PANEL
Cholesterol: 147 mg/dL (ref 0–200)
HDL: 27 mg/dL — ABNORMAL LOW (ref >40)
LDL Cholesterol: 45 mg/dL (ref 0–99)
Total CHOL/HDL Ratio: 5.4 RATIO
Triglycerides: 377 mg/dL — ABNORMAL HIGH (ref <150)
VLDL: 75 mg/dL — ABNORMAL HIGH (ref 0–40)

## 2019-08-03 NOTE — Progress Notes (Signed)
[place] OFFICE PROGRESS NOTE   Diagnosis: Myelofibrosis  INTERVAL HISTORY:   [name] returns as scheduled.  She started [facility] on 07/19/2019.  She has noted an improved energy level and improvement in low back pain since starting the [facility].  No change in diffuse bone pain.  No new complaint.  Stable enlargement of the spleen.  Objective:  Vital signs in last 24 hours:  Blood pressure (!) 148/66, pulse 71, temperature 98.5 F (36.9 C), temperature source Oral, resp. rate 17, height 5' 9"  (1.753 m), weight 158 lb 14.4 oz (72.1 kg), SpO2 99 %.    Limited physical examination secondary to distancing with the COVID pandemic Cardio: Regular rate and rhythm GI: The spleen is palpable in the left abdomen extending to the umbilicus, no hepatomegaly Vascular: No leg edema   Lab Results:  Lab Results  Component Value Date   WBC 10.9 (H) 08/03/2019   HGB 10.7 (L) 08/03/2019   HCT 32.7 (L) 08/03/2019   MCV 87.7 08/03/2019   PLT 109 (L) 08/03/2019   NEUTROABS 5.5 08/03/2019    CMP  Lab Results  Component Value Date   NA 140 08/03/2019   K 4.0 08/03/2019   CL 108 08/03/2019   CO2 25 08/03/2019   GLUCOSE 82 08/03/2019   BUN 18 08/03/2019   CREATININE 0.82 08/03/2019   CALCIUM 8.7 (L) 08/03/2019   PROT 7.0 08/03/2019   ALBUMIN 4.6 08/03/2019   AST 25 08/03/2019   ALT 39 08/03/2019   ALKPHOS 88 08/03/2019   BILITOT 0.4 08/03/2019   GFRNONAA >60 08/03/2019   GFRAA >60 08/03/2019     Medications: I have reviewed the patient's current medications.   Assessment/Plan:  1. Stage II left-sided breast cancer diagnosed in February 2000. Screening mammogram 10/05/2011 with suggestion of a subcentimeter oval mass on the left superiorly and no suspicious masses, calcifications or architectural distortion in the right breast. She underwent biopsy of the "asymmetric density of concern in the superior aspect of the right breast" on 10/13/2011. Pathology showed a  fibroadenoma.  2. 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  3. 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 Bone pain, likely a rheumatic manifestation of myelofibrosis. 5. History of intermittent low-grade fever and "night sweats," likely systemic manifestations of myelofibrosis. 6. History of Nose bleeding-likely related to myelofibrosis with dysfunctional platelets 7. 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  8. Elevated creatinine July 2015-normal 08/06/2014, potentially related to blood pressure medication  9.  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  Increased low back pain July 2020  MRI lumbar spine 06/26/2019- diffuse T1 signal compatible with myelofibrosis, hyperintense T2 signal at T12 and L3, most likely hemangiomas  Bone scan 07/04/2019- diffuse increased and homogenous uptake compatible with myelofibrosis, no focal increased activity at T12 or L3   Disposition: [name] appears to be tolerating the Ruxolitinib well.  Her energy level has improved.  The platelet  count is lower today.  She will call for bleeding or bruising.  She will return for a platelet count on 08/14/2019.  She will be scheduled  for an office and lab visit in 3 weeks.  Betsy Coder, MD  08/03/2019  4:49 PM

## 2019-08-04 ENCOUNTER — Telehealth: Payer: Self-pay | Admitting: Oncology

## 2019-08-04 NOTE — Telephone Encounter (Signed)
Called and spoke to patient. Confirmed appts

## 2019-08-08 ENCOUNTER — Other Ambulatory Visit: Payer: Self-pay | Admitting: Oncology

## 2019-08-08 DIAGNOSIS — D7581 Myelofibrosis: Secondary | ICD-10-CM

## 2019-08-11 LAB — JAK2 (INCLUDING V617F AND EXON 12), MPL,& CALR W/RFL MPN PANEL (NGS)

## 2019-08-14 ENCOUNTER — Other Ambulatory Visit: Payer: Self-pay

## 2019-08-14 ENCOUNTER — Inpatient Hospital Stay: Payer: BC Managed Care – PPO

## 2019-08-14 ENCOUNTER — Other Ambulatory Visit: Payer: BC Managed Care – PPO

## 2019-08-14 DIAGNOSIS — D7581 Myelofibrosis: Secondary | ICD-10-CM | POA: Diagnosis not present

## 2019-08-14 LAB — CBC WITH DIFFERENTIAL (CANCER CENTER ONLY)
Abs Immature Granulocytes: 1.06 10*3/uL — ABNORMAL HIGH (ref 0.00–0.07)
Basophils Absolute: 0.1 10*3/uL (ref 0.0–0.1)
Basophils Relative: 1 %
Eosinophils Absolute: 0.1 10*3/uL (ref 0.0–0.5)
Eosinophils Relative: 1 %
HCT: 28.7 % — ABNORMAL LOW (ref 36.0–46.0)
Hemoglobin: 9.2 g/dL — ABNORMAL LOW (ref 12.0–15.0)
Immature Granulocytes: 13 %
Lymphocytes Relative: 25 %
Lymphs Abs: 2.1 10*3/uL (ref 0.7–4.0)
MCH: 28.7 pg (ref 26.0–34.0)
MCHC: 32.1 g/dL (ref 30.0–36.0)
MCV: 89.4 fL (ref 80.0–100.0)
Monocytes Absolute: 0.7 10*3/uL (ref 0.1–1.0)
Monocytes Relative: 8 %
Neutro Abs: 4.4 10*3/uL (ref 1.7–7.7)
Neutrophils Relative %: 52 %
Platelet Count: 94 10*3/uL — ABNORMAL LOW (ref 150–400)
RBC: 3.21 MIL/uL — ABNORMAL LOW (ref 3.87–5.11)
RDW: 15.9 % — ABNORMAL HIGH (ref 11.5–15.5)
WBC Count: 8.4 10*3/uL (ref 4.0–10.5)
nRBC: 2.3 % — ABNORMAL HIGH (ref 0.0–0.2)

## 2019-08-14 MED FILL — JAKAFI 15 MG TABLET: 15 | 15 days supply | Qty: 30 | Fill #0

## 2019-08-15 ENCOUNTER — Telehealth: Payer: Self-pay | Admitting: *Deleted

## 2019-08-15 NOTE — Telephone Encounter (Signed)
-----   Message from Ladell Pier, MD sent at 08/14/2019  6:09 PM EDT ----- Please call patient, the platelets are slightly lower, but remain adequate, continue Jakafi, call for bleeding, follow-up as scheduled

## 2019-08-15 NOTE — Telephone Encounter (Signed)
Notified of message below. Verbalized understanding 

## 2019-08-21 ENCOUNTER — Other Ambulatory Visit: Payer: Self-pay | Admitting: Oncology

## 2019-08-21 DIAGNOSIS — D7581 Myelofibrosis: Secondary | ICD-10-CM

## 2019-08-21 MED ORDER — HYDROCODONE-ACETAMINOPHEN 5-325 MG PO TABS: 1.0000 | ORAL_TABLET | ORAL | 0 refills | Status: DC | PRN

## 2019-08-24 ENCOUNTER — Inpatient Hospital Stay: Payer: BC Managed Care – PPO | Admitting: Nurse Practitioner

## 2019-08-24 ENCOUNTER — Telehealth: Payer: Self-pay | Admitting: *Deleted

## 2019-08-24 ENCOUNTER — Inpatient Hospital Stay: Payer: BC Managed Care – PPO

## 2019-08-24 NOTE — Telephone Encounter (Signed)
At church 9/27 wearing a mask and couple 3 rows in front of her were also wearing mask, but have tested + for COVID on 08/23/19. She had no contact with them, however their grand daughter did have close contact with them and was in contact w/her husband without a mask. Asking if she should reschedule today's appointment. Per Dr. Benay Spice: Move lab/OV to 09/04/19 and call for any bleeding or unexplained bruises. Patient understands and agrees. She will not be going back to church.

## 2019-08-25 ENCOUNTER — Telehealth: Payer: Self-pay | Admitting: Oncology

## 2019-08-25 NOTE — Telephone Encounter (Signed)
Scheduled appt per 10/1 sch message - pt aware of appt date and time

## 2019-08-28 MED FILL — JAKAFI 15 MG TABLET: 15 | 15 days supply | Qty: 30 | Fill #1

## 2019-09-05 ENCOUNTER — Other Ambulatory Visit: Payer: Self-pay

## 2019-09-05 ENCOUNTER — Inpatient Hospital Stay: Payer: BC Managed Care – PPO

## 2019-09-05 ENCOUNTER — Inpatient Hospital Stay: Payer: BC Managed Care – PPO | Attending: Oncology | Admitting: Oncology

## 2019-09-05 VITALS — BP 146/79 | HR 70 | Temp 98.5°F | Resp 16 | Ht 69.0 in | Wt 163.5 lb

## 2019-09-05 DIAGNOSIS — D7581 Myelofibrosis: Secondary | ICD-10-CM

## 2019-09-05 DIAGNOSIS — Z853 Personal history of malignant neoplasm of breast: Secondary | ICD-10-CM | POA: Diagnosis not present

## 2019-09-05 DIAGNOSIS — R161 Splenomegaly, not elsewhere classified: Secondary | ICD-10-CM | POA: Diagnosis not present

## 2019-09-05 DIAGNOSIS — Z5181 Encounter for therapeutic drug level monitoring: Secondary | ICD-10-CM

## 2019-09-05 DIAGNOSIS — D6959 Other secondary thrombocytopenia: Secondary | ICD-10-CM | POA: Diagnosis not present

## 2019-09-05 DIAGNOSIS — D6481 Anemia due to antineoplastic chemotherapy: Secondary | ICD-10-CM | POA: Diagnosis not present

## 2019-09-05 LAB — CBC WITH DIFFERENTIAL (CANCER CENTER ONLY)
Abs Immature Granulocytes: 0.73 10*3/uL — ABNORMAL HIGH (ref 0.00–0.07)
Basophils Absolute: 0.1 10*3/uL (ref 0.0–0.1)
Basophils Relative: 1 %
Eosinophils Absolute: 0 10*3/uL (ref 0.0–0.5)
Eosinophils Relative: 1 %
HCT: 26.8 % — ABNORMAL LOW (ref 36.0–46.0)
Hemoglobin: 8.7 g/dL — ABNORMAL LOW (ref 12.0–15.0)
Immature Granulocytes: 13 %
Lymphocytes Relative: 22 %
Lymphs Abs: 1.2 10*3/uL (ref 0.7–4.0)
MCH: 28.7 pg (ref 26.0–34.0)
MCHC: 32.5 g/dL (ref 30.0–36.0)
MCV: 88.4 fL (ref 80.0–100.0)
Monocytes Absolute: 0.7 10*3/uL (ref 0.1–1.0)
Monocytes Relative: 12 %
Neutro Abs: 2.8 10*3/uL (ref 1.7–7.7)
Neutrophils Relative %: 51 %
Platelet Count: 71 10*3/uL — ABNORMAL LOW (ref 150–400)
RBC: 3.03 MIL/uL — ABNORMAL LOW (ref 3.87–5.11)
RDW: 17.3 % — ABNORMAL HIGH (ref 11.5–15.5)
WBC Count: 5.6 10*3/uL (ref 4.0–10.5)
nRBC: 4.1 % — ABNORMAL HIGH (ref 0.0–0.2)

## 2019-09-05 LAB — CMP (CANCER CENTER ONLY)
ALT: 25 U/L (ref 0–44)
AST: 20 U/L (ref 15–41)
Albumin: 4.4 g/dL (ref 3.5–5.0)
Alkaline Phosphatase: 63 U/L (ref 38–126)
Anion gap: 9 (ref 5–15)
BUN: 14 mg/dL (ref 6–20)
CO2: 23 mmol/L (ref 22–32)
Calcium: 8.9 mg/dL (ref 8.9–10.3)
Chloride: 108 mmol/L (ref 98–111)
Creatinine: 0.86 mg/dL (ref 0.44–1.00)
GFR, Est AFR Am: >60 mL/min (ref >60)
GFR, Estimated: >60 mL/min (ref >60)
Glucose, Bld: 96 mg/dL (ref 70–99)
Potassium: 4.1 mmol/L (ref 3.5–5.1)
Sodium: 140 mmol/L (ref 135–145)
Total Bilirubin: 0.6 mg/dL (ref 0.3–1.2)
Total Protein: 6.8 g/dL (ref 6.5–8.1)

## 2019-09-05 LAB — LIPID PANEL
Cholesterol: 146 mg/dL (ref 0–200)
HDL: 31 mg/dL — ABNORMAL LOW (ref >40)
LDL Cholesterol: 66 mg/dL (ref 0–99)
Total CHOL/HDL Ratio: 4.7 RATIO
Triglycerides: 244 mg/dL — ABNORMAL HIGH (ref <150)
VLDL: 49 mg/dL — ABNORMAL HIGH (ref 0–40)

## 2019-09-05 NOTE — Progress Notes (Signed)
[place] OFFICE PROGRESS NOTE   Diagnosis: Myelofibrosis  INTERVAL HISTORY:   Ms. Matsuoka continues 39.  She reports improvement.  Her energy level is diminished.  No apparent change in the splenomegaly.  The low back pain resolved.  No bleeding.  She has an ulcer at the left side of the tongue.  Objective:  Vital signs in last 24 hours:  Blood pressure (!) 146/79, pulse 70, temperature 98.5 F (36.9 C), temperature source Temporal, resp. rate 16, height 5' 9"  (1.753 m), weight 163 lb 8 oz (74.2 kg), SpO2 100 %.    HEENT: No thrush, small resolving ulcer at the left lateral tongue GI: No hepatomegaly, nontender, the spleen is palpable throughout the left a few fingers below the umbilicus, the spleen does not cross the midline Vascular: No leg edema     Lab Results:  Lab Results  Component Value Date   WBC 5.6 09/05/2019   HGB 8.7 (L) 09/05/2019   HCT 26.8 (L) 09/05/2019   MCV 88.4 09/05/2019   PLT 71 (L) 09/05/2019   NEUTROABS PENDING 09/05/2019    CMP  Lab Results  Component Value Date   NA 140 08/03/2019   K 4.0 08/03/2019   CL 108 08/03/2019   CO2 25 08/03/2019   GLUCOSE 82 08/03/2019   BUN 18 08/03/2019   CREATININE 0.82 08/03/2019   CALCIUM 8.7 (L) 08/03/2019   PROT 7.0 08/03/2019   ALBUMIN 4.6 08/03/2019   AST 25 08/03/2019   ALT 39 08/03/2019   ALKPHOS 88 08/03/2019   BILITOT 0.4 08/03/2019   GFRNONAA >60 08/03/2019   GFRAA >60 08/03/2019     Medications: I have reviewed the patient's current medications.   Assessment/Plan: 1. Stage II left-sided breast cancer diagnosed in February 2000. Screening mammogram 10/05/2011 with suggestion of a subcentimeter oval mass on the left superiorly and no suspicious masses, calcifications or architectural distortion in the right breast. She underwent biopsy of the "asymmetric density of concern in the superior aspect of the right breast" on 10/13/2011. Pathology showed a fibroadenoma.   2. 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  3. 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 Bone pain, likely a rheumatic manifestation of myelofibrosis. 5. History of intermittent low-grade fever and "night sweats," likely systemic manifestations of myelofibrosis. 6. History of Nose bleeding-likely related to myelofibrosis with dysfunctional platelets 7. 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  8. Elevated creatinine July 2015-normal 08/06/2014, potentially related to blood pressure medication  9.  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  Increased low back pain July 2020  MRI lumbar spine 06/26/2019- diffuse T1 signal compatible with myelofibrosis, hyperintense T2 signal at T12 and L3, most likely hemangiomas  Bone scan 07/04/2019- diffuse increased and homogenous uptake compatible with myelofibrosis, no focal increased activity at T12 or L3    Disposition: Ms. Wissink appears unchanged.  She has progressive anemia and thrombocytopenia secondary to ruxolitinib.  I discussed the hematologic findings with the [facility] pharmacy.  The ruxolitinib will be decreased to 15 mg once daily.  She will return for lab visit in 2 weeks and an office visit in 4 weeks.  She will call for bleeding or new symptoms.  She appears to have an aphthous ulcer at the left side of the tongue.  Betsy Coder, MD  09/05/2019  3:26 PM

## 2019-09-06 ENCOUNTER — Telehealth: Payer: Self-pay | Admitting: Oncology

## 2019-09-06 ENCOUNTER — Other Ambulatory Visit: Payer: Self-pay | Admitting: Oncology

## 2019-09-06 DIAGNOSIS — D7581 Myelofibrosis: Secondary | ICD-10-CM

## 2019-09-06 NOTE — Telephone Encounter (Signed)
Scheduled per los. Called and spoke with patient. Confirmed appt 

## 2019-09-11 ENCOUNTER — Other Ambulatory Visit: Payer: Self-pay | Admitting: Nurse Practitioner

## 2019-09-11 DIAGNOSIS — D7581 Myelofibrosis: Secondary | ICD-10-CM

## 2019-09-11 MED ORDER — HYDROCODONE-ACETAMINOPHEN 5-325 MG PO TABS: 1.0000 | ORAL_TABLET | ORAL | 0 refills | Status: DC | PRN

## 2019-09-20 ENCOUNTER — Other Ambulatory Visit: Payer: BC Managed Care – PPO

## 2019-09-20 ENCOUNTER — Telehealth: Payer: Self-pay | Admitting: Oncology

## 2019-09-20 NOTE — Telephone Encounter (Signed)
Called pt per 10/28 sch message - no answer . Left message for patient to confirm cancellation .

## 2019-09-22 ENCOUNTER — Telehealth: Payer: BC Managed Care – PPO | Admitting: Physician Assistant

## 2019-09-22 ENCOUNTER — Other Ambulatory Visit: Payer: BC Managed Care – PPO

## 2019-09-22 DIAGNOSIS — R399 Unspecified symptoms and signs involving the genitourinary system: Secondary | ICD-10-CM | POA: Diagnosis not present

## 2019-09-22 MED ORDER — NITROFURANTOIN MONOHYD MACRO 100 MG PO CAPS: 100.0000 mg | ORAL_CAPSULE | Freq: Two times a day (BID) | ORAL | 0 refills | Status: DC

## 2019-09-22 NOTE — Progress Notes (Signed)

## 2019-09-23 ENCOUNTER — Other Ambulatory Visit: Payer: Self-pay | Admitting: Oncology

## 2019-09-23 DIAGNOSIS — D7581 Myelofibrosis: Secondary | ICD-10-CM

## 2019-09-26 ENCOUNTER — Telehealth: Payer: Self-pay | Admitting: Oncology

## 2019-09-26 NOTE — Telephone Encounter (Signed)
Returned patient's phone call regarding rescheduling an appointment, per patient's request missed 10/30 appointment has been rescheduled to 11/13 and 11/11 appointments have moved to 12/04.

## 2019-10-02 MED FILL — JAKAFI 15 MG TABLET: 15 | 30 days supply | Qty: 30 | Fill #0

## 2019-10-04 ENCOUNTER — Other Ambulatory Visit: Payer: BC Managed Care – PPO

## 2019-10-04 ENCOUNTER — Ambulatory Visit: Payer: BC Managed Care – PPO | Admitting: Oncology

## 2019-10-04 ENCOUNTER — Other Ambulatory Visit: Payer: Self-pay | Admitting: Oncology

## 2019-10-04 DIAGNOSIS — D7581 Myelofibrosis: Secondary | ICD-10-CM

## 2019-10-04 MED ORDER — HYDROCODONE-ACETAMINOPHEN 5-325 MG PO TABS: 1.0000 | ORAL_TABLET | ORAL | 0 refills | Status: DC | PRN

## 2019-10-06 ENCOUNTER — Ambulatory Visit: Payer: BC Managed Care – PPO | Admitting: Nurse Practitioner

## 2019-10-06 ENCOUNTER — Inpatient Hospital Stay: Payer: BC Managed Care – PPO | Attending: Oncology

## 2019-10-06 ENCOUNTER — Other Ambulatory Visit: Payer: Self-pay

## 2019-10-06 DIAGNOSIS — D7581 Myelofibrosis: Secondary | ICD-10-CM | POA: Diagnosis not present

## 2019-10-06 DIAGNOSIS — Z5181 Encounter for therapeutic drug level monitoring: Secondary | ICD-10-CM

## 2019-10-06 LAB — CBC WITH DIFFERENTIAL (CANCER CENTER ONLY)
Abs Immature Granulocytes: 0.87 10*3/uL — ABNORMAL HIGH (ref 0.00–0.07)
Basophils Absolute: 0.1 10*3/uL (ref 0.0–0.1)
Basophils Relative: 1 %
Eosinophils Absolute: 0 10*3/uL (ref 0.0–0.5)
Eosinophils Relative: 0 %
HCT: 31.6 % — ABNORMAL LOW (ref 36.0–46.0)
Hemoglobin: 9.9 g/dL — ABNORMAL LOW (ref 12.0–15.0)
Immature Granulocytes: 14 %
Lymphocytes Relative: 20 %
Lymphs Abs: 1.2 10*3/uL (ref 0.7–4.0)
MCH: 29.5 pg (ref 26.0–34.0)
MCHC: 31.3 g/dL (ref 30.0–36.0)
MCV: 94 fL (ref 80.0–100.0)
Monocytes Absolute: 0.7 10*3/uL (ref 0.1–1.0)
Monocytes Relative: 12 %
Neutro Abs: 3.3 10*3/uL (ref 1.7–7.7)
Neutrophils Relative %: 53 %
Platelet Count: 147 10*3/uL — ABNORMAL LOW (ref 150–400)
RBC: 3.36 MIL/uL — ABNORMAL LOW (ref 3.87–5.11)
RDW: 17.8 % — ABNORMAL HIGH (ref 11.5–15.5)
WBC Count: 6.1 10*3/uL (ref 4.0–10.5)
nRBC: 2.5 % — ABNORMAL HIGH (ref 0.0–0.2)

## 2019-10-06 LAB — LIPID PANEL
Cholesterol: 126 mg/dL (ref 0–200)
HDL: 24 mg/dL — ABNORMAL LOW (ref >40)
LDL Cholesterol: 22 mg/dL (ref 0–99)
Total CHOL/HDL Ratio: 5.3 RATIO
Triglycerides: 400 mg/dL — ABNORMAL HIGH (ref <150)
VLDL: 80 mg/dL — ABNORMAL HIGH (ref 0–40)

## 2019-10-09 ENCOUNTER — Telehealth: Payer: Self-pay | Admitting: *Deleted

## 2019-10-09 NOTE — Telephone Encounter (Signed)
-----   Message from Ladell Pier, MD sent at 10/06/2019  3:57 PM EST ----- Please call patient , hb and platelets are better, continue same dose Jakafi, f/u as scheduled

## 2019-10-09 NOTE — Telephone Encounter (Signed)
Per Dr. Benay Spice, called and made pt aware that hgb and platelets were better and to continue same dose Jakafi and f/u as scheduled. Pt verbalized understanding.

## 2019-10-26 ENCOUNTER — Encounter: Payer: Self-pay | Admitting: Nurse Practitioner

## 2019-10-26 ENCOUNTER — Inpatient Hospital Stay: Payer: BC Managed Care – PPO | Attending: Oncology | Admitting: Nurse Practitioner

## 2019-10-26 ENCOUNTER — Other Ambulatory Visit: Payer: Self-pay

## 2019-10-26 ENCOUNTER — Inpatient Hospital Stay: Payer: BC Managed Care – PPO

## 2019-10-26 VITALS — BP 151/86 | HR 72 | Temp 98.0°F | Resp 17 | Ht 69.0 in | Wt 167.5 lb

## 2019-10-26 DIAGNOSIS — D7581 Myelofibrosis: Secondary | ICD-10-CM | POA: Insufficient documentation

## 2019-10-26 DIAGNOSIS — M898X9 Other specified disorders of bone, unspecified site: Secondary | ICD-10-CM | POA: Diagnosis not present

## 2019-10-26 DIAGNOSIS — Z853 Personal history of malignant neoplasm of breast: Secondary | ICD-10-CM | POA: Diagnosis not present

## 2019-10-26 DIAGNOSIS — R161 Splenomegaly, not elsewhere classified: Secondary | ICD-10-CM | POA: Insufficient documentation

## 2019-10-26 LAB — CBC WITH DIFFERENTIAL (CANCER CENTER ONLY)
Abs Immature Granulocytes: 0.66 10*3/uL — ABNORMAL HIGH (ref 0.00–0.07)
Basophils Absolute: 0.1 10*3/uL (ref 0.0–0.1)
Basophils Relative: 1 %
Eosinophils Absolute: 0 10*3/uL (ref 0.0–0.5)
Eosinophils Relative: 0 %
HCT: 26.8 % — ABNORMAL LOW (ref 36.0–46.0)
Hemoglobin: 8.5 g/dL — ABNORMAL LOW (ref 12.0–15.0)
Immature Granulocytes: 13 %
Lymphocytes Relative: 17 %
Lymphs Abs: 0.9 10*3/uL (ref 0.7–4.0)
MCH: 29.1 pg (ref 26.0–34.0)
MCHC: 31.7 g/dL (ref 30.0–36.0)
MCV: 91.8 fL (ref 80.0–100.0)
Monocytes Absolute: 0.5 10*3/uL (ref 0.1–1.0)
Monocytes Relative: 9 %
Neutro Abs: 2.9 10*3/uL (ref 1.7–7.7)
Neutrophils Relative %: 60 %
Platelet Count: 112 10*3/uL — ABNORMAL LOW (ref 150–400)
RBC: 2.92 MIL/uL — ABNORMAL LOW (ref 3.87–5.11)
RDW: 17.3 % — ABNORMAL HIGH (ref 11.5–15.5)
WBC Count: 4.9 10*3/uL (ref 4.0–10.5)
nRBC: 3.5 % — ABNORMAL HIGH (ref 0.0–0.2)

## 2019-10-26 LAB — LIPID PANEL
Cholesterol: 114 mg/dL (ref 0–200)
HDL: 24 mg/dL — ABNORMAL LOW (ref >40)
LDL Cholesterol: 50 mg/dL (ref 0–99)
Total CHOL/HDL Ratio: 4.8 RATIO
Triglycerides: 200 mg/dL — ABNORMAL HIGH (ref <150)
VLDL: 40 mg/dL (ref 0–40)

## 2019-10-26 MED ORDER — HYDROCODONE-ACETAMINOPHEN 5-325 MG PO TABS: 1.0000 | ORAL_TABLET | ORAL | 0 refills | Status: DC | PRN

## 2019-10-26 NOTE — Progress Notes (Addendum)
[place] OFFICE PROGRESS NOTE   Diagnosis:  Myelofibrosis   INTERVAL HISTORY:   [name] returns as scheduled.  She continues ruxolitinib 15 mg daily.  She thinks bone pain is slightly worse.  She continues to have pain related to the spleen.  She takes hydrocodone as needed.  No no fever or sweats.  She denies nausea.  No diarrhea.  No rash.  She is fatigued.  She denies shortness of breath.  Objective:  Vital signs in last 24 hours:  Blood pressure (!) 151/86, pulse 72, temperature 98 F (36.7 C), temperature source Temporal, resp. rate 17, weight 167 lb 8 oz (76 kg), SpO2 100 %.    HEENT: No thrush or ulcers.  Fractured tooth right mid upper gumline. GI: Abdomen is soft.  Spleen palpable throughout the left abdomen to approximately 2 fingerbreadths below the umbilicus.  Spleen does not cross midline. Vascular: No leg edema.  Calves soft and nontender. Skin: No rash. Lab Results:  Lab Results  Component Value Date   WBC 4.9 10/26/2019   HGB 8.5 (L) 10/26/2019   HCT 26.8 (L) 10/26/2019   MCV 91.8 10/26/2019   PLT 112 (L) 10/26/2019   NEUTROABS 2.9 10/26/2019    Imaging:  No results found.  Medications: I have reviewed the patient's current medications.  Assessment/Plan: 1. Stage II left-sided breast cancer diagnosed in February 2000. Screening mammogram 10/05/2011 with suggestion of a subcentimeter oval mass on the left superiorly and no suspicious masses, calcifications or architectural distortion in the right breast. She underwent biopsy of the "asymmetric density of concern in the superior aspect of the right breast" on 10/13/2011. Pathology showed a fibroadenoma.  2. 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  3. 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  Hydrea started at a dose of 500 mg daily on 12/07/2014  Discontinued in early 2017, resume July 2017, discontinued permanently 08/04/2016  Peripheral blood  MPN panel 07/11/2019- MPL mutation, Negative for JAK2 and CALR mutations  Ruxolitinib started 07/19/2019  Ruxolitinib decreased to 15 mg daily 09/05/2019  Ruxolitinib increased to 15 mg alternating with 30 mg 4. Bone pain, likely a rheumatic manifestation of myelofibrosis. 5. History of intermittent low-grade fever and "night sweats," likely systemic manifestations of myelofibrosis. 6. History of Nose bleeding-likely related to myelofibrosis with dysfunctional platelets 7. 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  8. 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  Increased low back pain July 2020  MRI lumbar spine 06/26/2019- diffuse T1 signal compatible with myelofibrosis, hyperintense T2 signal at T12 and L3, most likely hemangiomas  Bone scan 07/04/2019- diffuse increased and homogenous uptake compatible with myelofibrosis, no focal increased activity at T12 or L3  Disposition: [name] appears unchanged.  She is currently taking ruxolitinib 15 mg daily.  Hemoglobin and platelets overall stable.  She is having more pain and in general does not feel well.  The ruxolitinib will be increased to 15 mg alternating with 30 mg.  We discussed red cell transfusion support.  She declines this at present.  She will continue hydrocodone as needed for the bone pain and pain related to splenomegaly.  She will return for a CBC and follow-up visit in 2 weeks.  She will contact the office in the interim  with any problems.  Patient seen with Dr. Benay Spice.    Ned Card ANP/GNP-BC   10/26/2019  3:23 PM  This was a shared visit with Ned Card.  Ms. Bias was interviewed and examined.  I suspect her symptoms are related to  mild try dose escalating the ruxolitinib.  She declines red cell transfusion and transplant evaluation.  Julieanne Manson, MD

## 2019-10-27 ENCOUNTER — Telehealth: Payer: Self-pay | Admitting: Oncology

## 2019-10-27 ENCOUNTER — Other Ambulatory Visit: Payer: Self-pay | Admitting: Oncology

## 2019-10-27 DIAGNOSIS — D7581 Myelofibrosis: Secondary | ICD-10-CM

## 2019-10-27 NOTE — Telephone Encounter (Signed)
Scheduled per los. Called and spoke with patient. Confirmed appt 

## 2019-10-30 MED FILL — JAKAFI 15 MG TABLET: 15 | 30 days supply | Qty: 45 | Fill #0

## 2019-11-02 ENCOUNTER — Telehealth: Payer: Self-pay | Admitting: *Deleted

## 2019-11-02 NOTE — Telephone Encounter (Addendum)
Just received the new Jakifi medication to increase dose to 15 mg alternating with 30 mg daily. She wants to delay start of this till 12/15 and f/u w/Dr. Benay Spice on 12/29. Asking if he will agree to this? Called back and informed her Dr. Benay Spice agrees with her request above. Scheduler will call with new appointment.

## 2019-11-03 ENCOUNTER — Telehealth: Payer: Self-pay | Admitting: Nurse Practitioner

## 2019-11-03 NOTE — Telephone Encounter (Signed)
Scheduled appt per 12/11 sch message - pt aware of apt date and time

## 2019-11-09 ENCOUNTER — Ambulatory Visit: Payer: BC Managed Care – PPO | Admitting: Nurse Practitioner

## 2019-11-09 ENCOUNTER — Other Ambulatory Visit: Payer: BC Managed Care – PPO

## 2019-11-14 ENCOUNTER — Other Ambulatory Visit: Payer: Self-pay | Admitting: Nurse Practitioner

## 2019-11-14 DIAGNOSIS — D7581 Myelofibrosis: Secondary | ICD-10-CM

## 2019-11-14 MED ORDER — HYDROCODONE-ACETAMINOPHEN 5-325 MG PO TABS: 1.0000 | ORAL_TABLET | ORAL | 0 refills | Status: DC | PRN

## 2019-11-20 ENCOUNTER — Emergency Department (HOSPITAL_COMMUNITY)
Admission: EM | Admit: 2019-11-20 | Discharge: 2019-11-20 | Disposition: A | Discharge status: Home / Self Care | Payer: BC Managed Care – PPO | Attending: Emergency Medicine | Admitting: Emergency Medicine

## 2019-11-20 ENCOUNTER — Encounter (HOSPITAL_COMMUNITY): Payer: Self-pay | Admitting: Emergency Medicine

## 2019-11-20 ENCOUNTER — Other Ambulatory Visit: Payer: Self-pay

## 2019-11-20 ENCOUNTER — Emergency Department (HOSPITAL_COMMUNITY): Payer: BC Managed Care – PPO

## 2019-11-20 DIAGNOSIS — W208XXA Other cause of strike by thrown, projected or falling object, initial encounter: Secondary | ICD-10-CM | POA: Insufficient documentation

## 2019-11-20 DIAGNOSIS — Y92019 Unspecified place in single-family (private) house as the place of occurrence of the external cause: Secondary | ICD-10-CM | POA: Insufficient documentation

## 2019-11-20 DIAGNOSIS — W1830XA Fall on same level, unspecified, initial encounter: Secondary | ICD-10-CM | POA: Insufficient documentation

## 2019-11-20 DIAGNOSIS — Z23 Encounter for immunization: Secondary | ICD-10-CM | POA: Insufficient documentation

## 2019-11-20 DIAGNOSIS — H53149 Visual discomfort, unspecified: Secondary | ICD-10-CM | POA: Diagnosis not present

## 2019-11-20 DIAGNOSIS — H538 Other visual disturbances: Secondary | ICD-10-CM | POA: Diagnosis not present

## 2019-11-20 DIAGNOSIS — S0531XA Ocular laceration without prolapse or loss of intraocular tissue, right eye, initial encounter: Secondary | ICD-10-CM

## 2019-11-20 DIAGNOSIS — S0181XA Laceration without foreign body of other part of head, initial encounter: Secondary | ICD-10-CM | POA: Diagnosis present

## 2019-11-20 DIAGNOSIS — R519 Headache, unspecified: Secondary | ICD-10-CM | POA: Insufficient documentation

## 2019-11-20 DIAGNOSIS — H5711 Ocular pain, right eye: Secondary | ICD-10-CM

## 2019-11-20 DIAGNOSIS — Y93E9 Activity, other interior property and clothing maintenance: Secondary | ICD-10-CM | POA: Diagnosis not present

## 2019-11-20 DIAGNOSIS — W19XXXA Unspecified fall, initial encounter: Secondary | ICD-10-CM

## 2019-11-20 DIAGNOSIS — Y999 Unspecified external cause status: Secondary | ICD-10-CM | POA: Insufficient documentation

## 2019-11-20 DIAGNOSIS — S0511XA Contusion of eyeball and orbital tissues, right eye, initial encounter: Secondary | ICD-10-CM | POA: Diagnosis not present

## 2019-11-20 LAB — BASIC METABOLIC PANEL
Anion gap: 8 (ref 5–15)
BUN: 12 mg/dL (ref 6–20)
CO2: 23 mmol/L (ref 22–32)
Calcium: 9.3 mg/dL (ref 8.9–10.3)
Chloride: 108 mmol/L (ref 98–111)
Creatinine, Ser: 1.03 mg/dL — ABNORMAL HIGH (ref 0.44–1.00)
GFR calc Af Amer: >60 mL/min (ref >60)
GFR calc non Af Amer: >60 mL/min (ref >60)
Glucose, Bld: 113 mg/dL — ABNORMAL HIGH (ref 70–99)
Potassium: 4 mmol/L (ref 3.5–5.1)
Sodium: 139 mmol/L (ref 135–145)

## 2019-11-20 LAB — I-STAT CHEM 8, ED
BUN: 16 mg/dL (ref 6–20)
Calcium, Ion: 1.27 mmol/L (ref 1.15–1.40)
Chloride: 107 mmol/L (ref 98–111)
Creatinine, Ser: 1 mg/dL (ref 0.44–1.00)
Glucose, Bld: 114 mg/dL — ABNORMAL HIGH (ref 70–99)
HCT: 26 % — ABNORMAL LOW (ref 36.0–46.0)
Hemoglobin: 8.8 g/dL — ABNORMAL LOW (ref 12.0–15.0)
Potassium: 4.1 mmol/L (ref 3.5–5.1)
Sodium: 139 mmol/L (ref 135–145)
TCO2: 26 mmol/L (ref 22–32)

## 2019-11-20 LAB — CBC WITH DIFFERENTIAL/PLATELET
Abs Immature Granulocytes: 0.78 10*3/uL — ABNORMAL HIGH (ref 0.00–0.07)
Basophils Absolute: 0.1 10*3/uL (ref 0.0–0.1)
Basophils Relative: 1 %
Eosinophils Absolute: 0 10*3/uL (ref 0.0–0.5)
Eosinophils Relative: 0 %
HCT: 31.1 % — ABNORMAL LOW (ref 36.0–46.0)
Hemoglobin: 10.1 g/dL — ABNORMAL LOW (ref 12.0–15.0)
Immature Granulocytes: 15 %
Lymphocytes Relative: 18 %
Lymphs Abs: 1 10*3/uL (ref 0.7–4.0)
MCH: 29.9 pg (ref 26.0–34.0)
MCHC: 32.5 g/dL (ref 30.0–36.0)
MCV: 92 fL (ref 80.0–100.0)
Monocytes Absolute: 0.5 10*3/uL (ref 0.1–1.0)
Monocytes Relative: 9 %
Neutro Abs: 3 10*3/uL (ref 1.7–7.7)
Neutrophils Relative %: 57 %
Platelets: 95 10*3/uL — ABNORMAL LOW (ref 150–400)
RBC: 3.38 MIL/uL — ABNORMAL LOW (ref 3.87–5.11)
RDW: 16.5 % — ABNORMAL HIGH (ref 11.5–15.5)
WBC: 5.2 10*3/uL (ref 4.0–10.5)
nRBC: 3.1 % — ABNORMAL HIGH (ref 0.0–0.2)

## 2019-11-20 MED ORDER — HYDROMORPHONE HCL 1 MG/ML IJ SOLN
1.0000 mg | Freq: Once | INTRAMUSCULAR | Status: AC
  Administered 2019-11-20: 1 mg via INTRAVENOUS
  Filled 2019-11-20: qty 1

## 2019-11-20 MED ORDER — FENTANYL CITRATE (PF) 100 MCG/2ML IJ SOLN
50.0000 ug | Freq: Once | INTRAMUSCULAR | Status: AC
  Administered 2019-11-20: 50 ug via INTRAVENOUS
  Filled 2019-11-20: qty 2

## 2019-11-20 MED ORDER — FENTANYL CITRATE (PF) 100 MCG/2ML IJ SOLN
50.0000 ug | Freq: Once | INTRAMUSCULAR | Status: DC
  Filled 2019-11-20: qty 2

## 2019-11-20 MED ORDER — BACITRACIN-POLYMYXIN B 500-10000 UNIT/GM OP OINT: 1.0000 "application " | TOPICAL_OINTMENT | Freq: Two times a day (BID) | OPHTHALMIC | 1 refills | Status: DC

## 2019-11-20 MED ORDER — TETANUS-DIPHTH-ACELL PERTUSSIS 5-2.5-18.5 LF-MCG/0.5 IM SUSP
0.5000 mL | Freq: Once | INTRAMUSCULAR | Status: AC
  Administered 2019-11-20: 0.5 mL via INTRAMUSCULAR
  Filled 2019-11-20: qty 0.5

## 2019-11-20 MED ORDER — FLUORESCEIN SODIUM 1 MG OP STRP
ORAL_STRIP | OPHTHALMIC | Status: AC
  Filled 2019-11-20: qty 1

## 2019-11-20 MED ORDER — FENTANYL CITRATE (PF) 100 MCG/2ML IJ SOLN
100.0000 ug | Freq: Once | INTRAMUSCULAR | Status: AC
  Administered 2019-11-20: 100 ug via INTRAVENOUS
  Filled 2019-11-20: qty 2

## 2019-11-20 MED ORDER — IOHEXOL 300 MG/ML  SOLN
75.0000 mL | Freq: Once | INTRAMUSCULAR | Status: AC | PRN
  Administered 2019-11-20: 75 mL via INTRAVENOUS

## 2019-11-20 MED ORDER — FENTANYL CITRATE (PF) 100 MCG/2ML IJ SOLN
50.0000 ug | Freq: Once | INTRAMUSCULAR | Status: AC
  Administered 2019-11-20: 50 ug via INTRAVENOUS
  Filled 2019-11-20 (×2): qty 2

## 2019-11-20 MED ORDER — HYDROCODONE-ACETAMINOPHEN 5-325 MG PO TABS: 1.0000 | ORAL_TABLET | ORAL | 0 refills | Status: DC | PRN

## 2019-11-20 MED ORDER — MORPHINE SULFATE (PF) 4 MG/ML IV SOLN
4.0000 mg | Freq: Once | INTRAVENOUS | Status: AC
  Administered 2019-11-20: 4 mg via INTRAVENOUS
  Filled 2019-11-20: qty 1

## 2019-11-20 MED ORDER — TETRACAINE HCL 0.5 % OP SOLN
2.0000 [drp] | Freq: Once | OPHTHALMIC | Status: AC
  Administered 2019-11-20: 2 [drp] via OPHTHALMIC
  Filled 2019-11-20: qty 4

## 2019-11-20 MED ORDER — ONDANSETRON HCL 4 MG/2ML IJ SOLN
4.0000 mg | Freq: Once | INTRAMUSCULAR | Status: AC
  Administered 2019-11-20: 4 mg via INTRAVENOUS
  Filled 2019-11-20: qty 2

## 2019-11-20 NOTE — ED Notes (Signed)
171mcg Fentanyl wasted in sink. Witnessed by Magda Paganini, RN. Ordered was discontinued

## 2019-11-20 NOTE — ED Notes (Signed)
Pt ambulated to bathroom and back to bed without difficulty
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
## 2019-11-20 NOTE — ED Notes (Signed)
Patient verbalizes understanding of discharge instructions. Opportunity for questioning and answers were provided. Armband removed by staff, pt discharged from ED.  

## 2019-11-20 NOTE — Discharge Instructions (Signed)
Follow-up with ophthalmology tomorrow.  Take the pain medicine as prescribed.  May also take ibuprofen for pain.  This is also similar to the pain medicine that you take at home.  Make sure to not double up on your pain medicine as this may cause sedation, drowsiness and decreased respiratory rate.

## 2019-11-20 NOTE — ED Provider Notes (Signed)
[facility] EMERGENCY DEPARTMENT Provider Note   CSN: [id number] Arrival date & time: 11/20/19  1106     History Chief Complaint  Patient presents with   Fall   Laceration   Eye Injury    [name] is a 53 y.o. female with past medical history significant for myelofibrosis who presents for evaluation after mechanical fall.  Patient states she was cleaning out her fireplace and holding the wood poker when she suffered a mechanical fall.  Patient states the poker went into her right eye.  Patient noticed immediate decreased vision, pain and bleeding to her right eye.  Denies preceding chest pain, lightheadedness, dizziness, sudden onset thunderclap headache.  Patient is not followed by ophthalmology and has no previous vision issues.  Midst to right-sided headache, right-sided cheek pain.  Denies neck pain, lightheadedness, dizziness, chest pain, shortness of breath, decreased range of motion to extremities, pain to extremities, back pain, pelvic pain.  Denies additional aggravating or alleviating factors.  Rates her current pain a 10/10.  Does not wear corrective lenses at baseline.  History obtained from patient and past medical records.  No interpreter is used.  HPI     Past Medical History:  Diagnosis Date   Breast cancer ([place])    Leukemia ([place]) 2007   Mylefibrosis   Myelofibrosis [facility])     Patient Active Problem List   Diagnosis Date Noted   Renal insufficiency, mild 06/12/2014   Chest pain 06/11/2014   Breast cancer ([place])    ELEVATED BLOOD PRESSURE 12/11/2009   CONTUSION OF BACK 12/11/2009   Myelofibrosis ([place]) 02/23/2008   CELLULITIS, [place] 02/23/2008    Past Surgical History:  Procedure Laterality Date   ABDOMINAL HYSTERECTOMY     BREAST SURGERY       OB History   No obstetric history on file.     Family History  Problem Relation Age of Onset   Breast cancer Maternal Aunt        dx in her 3s   Lung cancer  Maternal Grandmother        dx in her 52x - smoker   Lung cancer Maternal Aunt        dx in her 14s - smoker   Leukemia Cousin 4       maternal first cousin - child of aunt with breast cancer   Breast cancer Cousin        dx in her 2s - daughter of aunt with lung cancer   Colon cancer Cousin        paternal first cousin dx in his 67s   Brain cancer Cousin 66       paternal cousin   Breast cancer Other        MGM's sister dx in her 75s   Breast cancer Other        mother's maternal first cousin dx in her 15s - BRCA neg   Prostate cancer Other        MGM's brother    Social History   Tobacco Use   Smoking status: Never Smoker  Substance Use Topics   Alcohol use: No   Drug use: No    Home Medications Prior to Admission medications   Medication Sig Start Date End Date Taking? Authorizing Provider  ALPRAZolam Duanne Moron) 0.5 MG tablet Take 0.5 mg by mouth 2 (two) times daily as needed for anxiety.    Yes [provider]  escitalopram (LEXAPRO) 20 MG tablet Take  20 mg by mouth daily. 02/12/15  Yes [provider]  ibuprofen (ADVIL) 800 MG tablet TAKE 1 TABLET BY MOUTH THREE TIMES A DAY Patient taking differently: Take 800 mg by mouth every 6 (six) hours as needed for fever or moderate pain.  09/25/19  Yes Ladell Pier, MD  ruxolitinib phosphate (JAKAFI) 15 MG tablet Take 15 mg (1 tablet) alternating with 30 mg (2 tablets) daily by mouth Patient taking differently: Take 15-30 mg by mouth See admin instructions. Take 15 mg (1 tablet) by mouth on Tues, Thurs, and Sat  ; take 2 tablets (30 mg totally) by mouth on Mon, Wed, Fri, and Sunday 10/27/19  Yes Ladell Pier, MD  zolpidem (AMBIEN) 10 MG tablet Take 10 mg by mouth at bedtime as needed for sleep.  06/02/13  Yes [provider]  bacitracin-polymyxin b (POLYSPORIN) ophthalmic ointment Place 1 application into the right eye every 12 (twelve) hours. apply to eye every 2 hours while awake 11/20/19    Henderly, Britni A, PA-C  HYDROcodone-acetaminophen (NORCO/VICODIN) 5-325 MG tablet Take 1 tablet by mouth every 4 (four) hours as needed. 11/20/19   Henderly, Britni A, PA-C    Allergies    Clarithromycin  Review of Systems   Review of Systems  Constitutional: Negative.   HENT: Positive for facial swelling and sinus pain. Negative for congestion, dental problem, ear discharge, ear pain, hearing loss, mouth sores, nosebleeds, postnasal drip, rhinorrhea, sinus pressure, sneezing, sore throat, trouble swallowing and voice change.   Eyes: Positive for photophobia, pain, redness and visual disturbance.  Respiratory: Negative.   Cardiovascular: Negative.   Gastrointestinal: Negative.   Musculoskeletal: Negative for back pain and neck pain.  Skin: Negative.   Neurological: Positive for headaches. Negative for dizziness, tremors, seizures, syncope, facial asymmetry, speech difficulty, weakness, light-headedness and numbness.  All other systems reviewed and are negative.  Physical Exam Updated Vital Signs BP (!) 141/73    Pulse 63    Temp 97.6 F (36.4 C) (Oral)    Resp 20    SpO2 98%   Physical Exam Vitals and nursing note reviewed.  Constitutional:      General: She is not in acute distress.    Appearance: She is well-developed. She is not ill-appearing or toxic-appearing.  HENT:     Head: Normocephalic. Abrasion, contusion and laceration present. No Battle's sign, right periorbital erythema or left periorbital erythema.     Jaw: There is normal jaw occlusion.     Comments: Ecchymosis to the periorbital area    Right Ear: No drainage, swelling or tenderness. No hemotympanum.     Left Ear: No drainage, swelling or tenderness. No hemotympanum.     Nose: Nose normal.     Comments: No septal hematoma.  No crepitus, tenderness palpation to nasal bridge.  She does have some right tenderness over her maxillary process.  She does have birthmark to this area.  No crepitus or step-off     Mouth/Throat:     Lips: Pink.     Mouth: Mucous membranes are moist.     Pharynx: Uvula midline.     Comments: Fractured dentition to left front tooth however patient states this is chronic.  She has follow-up with dentistry tomorrow. Eyes:     Pupils: Pupils are equal, round, and reactive to light.     Comments: Superficial, 8 mm laceration to right eyelid without gaping.  Extensive swelling and ecchymosis to right periorbital and orbital region. No consensual photophobia.  EOMs  to left eye.  Patient with minimal EOMs to right eye however she does have pain with looking superior to right eye.  Pupils equal reactive to light bilaterally.  Patient with obvious bleeding to her medial lateral conjunctiva.  Unable to perform IOP.  Patient unable to open right eye.  Was able to minimally open this however unable to get a good look due to swelling.  Admits to significant decreased vision and photophobia to right eye however unable to obtain visual fields due to swelling and pain.  Neck:     Comments: C-collar in place.  Phonation normal. Cardiovascular:     Rate and Rhythm: Normal rate.     Pulses: Normal pulses.          Dorsalis pedis pulses are 2+ on the right side and 2+ on the left side.       Posterior tibial pulses are 2+ on the right side and 2+ on the left side.     Heart sounds: Normal heart sounds.  Pulmonary:     Effort: Pulmonary effort is normal. No respiratory distress.     Breath sounds: Normal breath sounds and air entry.     Comments: Clear to auscultation bilateral without wheeze, rhonchi or rales Chest:     Comments: No crepitus, step-offs.  No tenderness to ribs. Abdominal:     General: There is no distension.     Tenderness: There is no abdominal tenderness.     Comments: Soft, non-tender without rebound or guarding.  No overlying skin changes  Musculoskeletal:        General: Normal range of motion.     Comments: Moves all 4 extremities without difficulty.  No midline  thoracic, lumbar pain, step-offs or crepitus.  No paraspinal muscle tenderness.  Pelvis stable, nontender to palpation.  No shortening or rotation of legs.  Able to straight leg raise bilaterally without difficulty.  Skin:    General: Skin is warm and dry.     Capillary Refill: Capillary refill takes less than 2 seconds.     Comments: Birthmark to right zygomatic process.  8 mm superficial laceration to right eyelid.  Does not extend into her medial lateral canthus.  Ecchymosis and erythema to right periorbital and orbital region.  Neurological:     Mental Status: She is alert.     Sensory: Sensation is intact.     Comments: Intact sensation to face          ED Results / Procedures / Treatments   Labs (all labs ordered are listed, but only abnormal results are displayed) Labs Reviewed  CBC WITH DIFFERENTIAL/PLATELET - Abnormal; Notable for the following components:      Result Value   RBC 3.38 (*)    Hemoglobin 10.1 (*)    HCT 31.1 (*)    RDW 16.5 (*)    Platelets 95 (*)    nRBC 3.1 (*)    Abs Immature Granulocytes 0.78 (*)    All other components within normal limits  BASIC METABOLIC PANEL - Abnormal; Notable for the following components:   Glucose, Bld 113 (*)    Creatinine, Ser 1.03 (*)    All other components within normal limits  I-STAT CHEM 8, ED - Abnormal; Notable for the following components:   Glucose, Bld 114 (*)    Hemoglobin 8.8 (*)    HCT 26.0 (*)    All other components within normal limits    EKG None  Radiology CT Head Wo Contrast  Result Date: 11/20/2019 CLINICAL DATA:  Recent fall with right facial trauma, initial encounter EXAM: CT HEAD WITHOUT CONTRAST CT MAXILLOFACIAL WITHOUT CONTRAST CT CERVICAL SPINE WITHOUT CONTRAST TECHNIQUE: Multidetector CT imaging of the head, cervical spine, and maxillofacial structures were performed using the standard protocol without intravenous contrast. Multiplanar CT image reconstructions of the cervical spine and  maxillofacial structures were also generated. COMPARISON:  None. FINDINGS: CT HEAD FINDINGS Brain: No evidence of acute infarction, hemorrhage, hydrocephalus, extra-axial collection or mass lesion/mass effect. Vascular: No hyperdense vessel or unexpected calcification. Skull: Normal. Negative for fracture or focal lesion. Other: None. CT MAXILLOFACIAL FINDINGS Osseous: No fracture or mandibular dislocation. No destructive process. Orbits: The orbits and their contents are within normal limits. Mild right periorbital swelling is noted without focal hematoma. Sinuses: Mucosal thickening is noted within the left maxillary antrum which appears chronic in nature. The remainder of the paranasal sinuses appear within normal limits. Soft tissues: Surrounding soft tissues demonstrate right preseptal soft tissue swelling about the orbit. No focal hematoma is noted. No other soft tissue abnormality is seen. CT CERVICAL SPINE FINDINGS Alignment: Within normal limits. Skull base and vertebrae: 7 cervical segments are well visualized. Diffuse sclerosis is noted with multifocal lytic areas similar to that seen on prior MRI of the lumbar spine consistent with the patient's given clinical history of myelofibrosis. No acute fracture or acute facet abnormality is noted. No compression deformities are seen. Mild facet hypertrophic changes are noted. Soft tissues and spinal canal: Surrounding soft tissue structures are within normal limits. Upper chest: Visualized lung apices are unremarkable. Other: None IMPRESSION: CT of the head: No acute intracranial abnormality noted. CT of the maxillofacial bones: No acute fracture is noted. Right periorbital soft tissue swelling is noted without focal hematoma consistent with the recent injury. CT of the cervical spine: No acute bony abnormality is noted. Chronic diffuse sclerosis consistent with the patient's given clinical history of myelofibrosis. Some scattered lytic areas are noted within  the cervical spine similar to that noted in the lumbar spine. Previous bone scan shows no focal abnormality. Electronically Signed   By: Inez Catalina M.D.   On: 11/20/2019 13:18   CT Cervical Spine Wo Contrast  Result Date: 11/20/2019 CLINICAL DATA:  Recent fall with right facial trauma, initial encounter EXAM: CT HEAD WITHOUT CONTRAST CT MAXILLOFACIAL WITHOUT CONTRAST CT CERVICAL SPINE WITHOUT CONTRAST TECHNIQUE: Multidetector CT imaging of the head, cervical spine, and maxillofacial structures were performed using the standard protocol without intravenous contrast. Multiplanar CT image reconstructions of the cervical spine and maxillofacial structures were also generated. COMPARISON:  None. FINDINGS: CT HEAD FINDINGS Brain: No evidence of acute infarction, hemorrhage, hydrocephalus, extra-axial collection or mass lesion/mass effect. Vascular: No hyperdense vessel or unexpected calcification. Skull: Normal. Negative for fracture or focal lesion. Other: None. CT MAXILLOFACIAL FINDINGS Osseous: No fracture or mandibular dislocation. No destructive process. Orbits: The orbits and their contents are within normal limits. Mild right periorbital swelling is noted without focal hematoma. Sinuses: Mucosal thickening is noted within the left maxillary antrum which appears chronic in nature. The remainder of the paranasal sinuses appear within normal limits. Soft tissues: Surrounding soft tissues demonstrate right preseptal soft tissue swelling about the orbit. No focal hematoma is noted. No other soft tissue abnormality is seen. CT CERVICAL SPINE FINDINGS Alignment: Within normal limits. Skull base and vertebrae: 7 cervical segments are well visualized. Diffuse sclerosis is noted with multifocal lytic areas similar to that seen on prior MRI of the lumbar spine consistent with  patient's given clinical history of myelofibrosis. No acute fracture or acute facet abnormality is noted. No compression deformities are  seen. Mild facet hypertrophic changes are noted. Soft tissues and spinal canal: Surrounding soft tissue structures are within normal limits. Upper chest: Visualized lung apices are unremarkable. Other: None IMPRESSION: CT of the head: No acute intracranial abnormality noted. CT of the maxillofacial bones: No acute fracture is noted. Right periorbital soft tissue swelling is noted without focal hematoma consistent with the recent injury. CT of the cervical spine: No acute bony abnormality is noted. Chronic diffuse sclerosis consistent with the patient's given clinical history of myelofibrosis. Some scattered lytic areas are noted within the cervical spine similar to that noted in the lumbar spine. Previous bone scan shows no focal abnormality. Electronically Signed   By: Inez Catalina M.D.   On: 11/20/2019 13:18   CT MAXILLOFACIAL W CONTRAST  Result Date: 11/20/2019 CLINICAL DATA:  Recent fall with right facial trauma, initial encounter EXAM: CT HEAD WITHOUT CONTRAST CT MAXILLOFACIAL WITHOUT CONTRAST CT CERVICAL SPINE WITHOUT CONTRAST TECHNIQUE: Multidetector CT imaging of the head, cervical spine, and maxillofacial structures were performed using the standard protocol without intravenous contrast. Multiplanar CT image reconstructions of the cervical spine and maxillofacial structures were also generated. COMPARISON:  None. FINDINGS: CT HEAD FINDINGS Brain: No evidence of acute infarction, hemorrhage, hydrocephalus, extra-axial collection or mass lesion/mass effect. Vascular: No hyperdense vessel or unexpected calcification. Skull: Normal. Negative for fracture or focal lesion. Other: None. CT MAXILLOFACIAL FINDINGS Osseous: No fracture or mandibular dislocation. No destructive process. Orbits: The orbits and their contents are within normal limits. Mild right periorbital swelling is noted without focal hematoma. Sinuses: Mucosal thickening is noted within the left maxillary antrum which appears chronic in  nature. The remainder of the paranasal sinuses appear within normal limits. Soft tissues: Surrounding soft tissues demonstrate right preseptal soft tissue swelling about the orbit. No focal hematoma is noted. No other soft tissue abnormality is seen. CT CERVICAL SPINE FINDINGS Alignment: Within normal limits. Skull base and vertebrae: 7 cervical segments are well visualized. Diffuse sclerosis is noted with multifocal lytic areas similar to that seen on prior MRI of the lumbar spine consistent with the patient's given clinical history of myelofibrosis. No acute fracture or acute facet abnormality is noted. No compression deformities are seen. Mild facet hypertrophic changes are noted. Soft tissues and spinal canal: Surrounding soft tissue structures are within normal limits. Upper chest: Visualized lung apices are unremarkable. Other: None IMPRESSION: CT of the head: No acute intracranial abnormality noted. CT of the maxillofacial bones: No acute fracture is noted. Right periorbital soft tissue swelling is noted without focal hematoma consistent with the recent injury. CT of the cervical spine: No acute bony abnormality is noted. Chronic diffuse sclerosis consistent with the patient's given clinical history of myelofibrosis. Some scattered lytic areas are noted within the cervical spine similar to that noted in the lumbar spine. Previous bone scan shows no focal abnormality. Electronically Signed   By: Inez Catalina M.D.   On: 11/20/2019 13:18    Procedures Procedures (including critical care time)  Medications Ordered in ED Medications  fluorescein 1 MG ophthalmic strip (has no administration in time range)  morphine 4 MG/ML injection 4 mg (4 mg Intravenous Given 11/20/19 1129)  ondansetron (ZOFRAN) injection 4 mg (4 mg Intravenous Given 11/20/19 1129)  Tdap (BOOSTRIX) injection 0.5 mL (0.5 mLs Intramuscular Given 11/20/19 1155)  HYDROmorphone (DILAUDID) injection 1 mg (1 mg Intravenous Given 11/20/19  1150)  iohexol (OMNIPAQUE) 300 MG/ML solution 75 mL (75 mLs Intravenous Contrast Given 11/20/19 1230)  fentaNYL (SUBLIMAZE) injection 50 mcg (50 mcg Intravenous Given 11/20/19 1314)  fentaNYL (SUBLIMAZE) injection 50 mcg (50 mcg Intravenous Given 11/20/19 1421)  tetracaine (PONTOCAINE) 0.5 % ophthalmic solution 2 drop (2 drops Both Eyes Given 11/20/19 1531)  fentaNYL (SUBLIMAZE) injection 50 mcg (50 mcg Intravenous Given 11/20/19 1510)  HYDROmorphone (DILAUDID) injection 1 mg (1 mg Intravenous Given 11/20/19 1637)  fentaNYL (SUBLIMAZE) injection 100 mcg (100 mcg Intravenous Given 11/20/19 1724)    ED Course  I have reviewed the triage vital signs and the nursing notes.  Pertinent labs & imaging results that were available during my care of the patient were reviewed by me and considered in my medical decision making (see chart for details).  53 year old female presents for evaluation mechanical fall.  Patient with facial trauma, eye pain and bleeding conjunctivitis right eye.  No consensual photophobia however does have photophobia when looking at her right eye.  Pupils equal reactive to light.  She does have bleeding to her conjunctiva, unsure where exactly this is coming from given significant swelling, echymosis.  Unable to obtain IOP's at this time due to pain, concern for globe rupture.  Given she does have pain with looking up concern for orbital entrapment.  Obtain CT head, max face, cervical.  Superficial 6-8 mm lac to eye lid however no gapping, very superficial to epidermis, Do no think this requires sutures at this time. Will obtain baseline labs and pain management.  Patient will need to be evaluated by ophthalmology here in the emergency department.  Patient admits to significant decrease in vision with her right eye however unable to obtain visual screen due to ability to open eye without moderate assistant from staff and patient unable to keep right eye open for any duration of  time.  CT tech has called me.  States we can get a CT max face with contrast which would be the same as the orbital with contrast.  States she will contact radiology to ensure they have a good read on patient's right eye, expressed concerns with globe rupture, orbital entrapment.  CT without acute findings specifically no intracranial abnormality, facial or orbital floor fractures, orbital hematoma, ocular entrapment or globe rupture.  Pain requiring multiple doses of pain meds for eye pain in ED.  Patient seen evaluated by attending physician, Dr. Sherry Ruffing who agrees with above treatment, plan and disposition.  Clinical Course as of Nov 19 1852  Mon Nov 20, 2019  1410 Consult with Dr. Valetta Close with ophthalmology. He would prefer patient to come to the office for evaluation however due to patient's significant requirement for pain management she will likely need admission. He is requesting picture be sent and he will contact me back.   [BH]  1452 CONSULT with after [place].  Picture sent.  He request IOP pressures and gentle irrigation of her conjunctiva.  Request call back if her pressures are elevated.  If her pressures remain elevated he recommends shield to her eye and he will evaluate her however this will likely be after 5:00.  He understands a time sensitive this.   [BH]  1536 IOP right 22,22,25, left 19,20,20.  After irrigating eye it appears she has a puncture wound/small laceration to the lateral limbus on the scleral end that appears to be bleeding.  Her laceration was cleaned however no longer bleeding and appears to be very superficial.  Patient unsure if her eye was open  or close during the fall however states after poking herself with the eye she continued to fall and hit her head on the mantle which would make sense for patient's seems to be 2 separate eye injuries.  Consult with Dr. Valetta Close and he is aware of patient's IOP and new exam findings.   [BH]  73 Dr. Valetta Close on way with  Opthamology requests patient with additional pain medicine before her exam. Patient states her pain is continued to be a 9/10 to her right eye. Discussed with attending, Dr. Sherry Ruffing given patient has already received 5 doses of pain medicine over the last 6 hours.  He is okay with additional pain medication given patient has stable vital signs with additional dose of pain medicine however will need to hold off on additional medicine after this due to the amount she has had during her ED stay    [BH]    Clinical Course User Index [BH] Henderly, Britni A, PA-C   1545: Patient requesting additional pain medicine however has only been 30 minutes since her last dose.  No relief with tetracaine for IOP.  1830: Patient seen and evaluated by Opthalmology Dr. Valetta Close. Patient with conjunctival lacerations however no suturing needed. See his note for full exam. IOP trending down. Opthalmology feels patient does not necessarily need inpatient except for possible pain control.  1840: Through discussion with patient. She does not want admission at this time. Discussed my recommendation for inpatient management for pain control given multiple doses in the ED. She is adamant that she would like to go home. Medically she is stable to go home aside from her continued pain however will dc home with Norco and bacitracin drops per Optho recommendation and she has follow up with in office tomorrow at 1:30 pm.  Ophthalmology agrees that patient's eyelid laceration does not require suturing.  She is to return for new or worsening symptoms.  Unfortunately does have a history of chronic pain which I feel is likely contributing to difficulty managing her pain in the ED given her chronic opioid use at home.  We discussed the nature and purpose, risks and benefits, as well as, the alternatives of treatment. Time was given to allow the opportunity to ask questions and consider their options, and after the discussion, the patient  decided to refuse the offerred treatment of inpatient management for pain control. Prior to refusing admission, I determined that the patient had the capacity to make their decision and understood the consequences of that decision. After refusal, I made every reasonable opportunity to treat them to the best of my ability.  The patient was notified that they may return to the emergency department at any time for further treatment.     MDM Rules/Calculators/A&P                       Final Clinical Impression(s) / ED Diagnoses Final diagnoses:  Fall, initial encounter  Facial laceration, initial encounter  Pain of right eye  Eye contusion, right, initial encounter  Conjunctival laceration, right, initial encounter    Rx / DC Orders ED Discharge Orders         Ordered    HYDROcodone-acetaminophen (NORCO/VICODIN) 5-325 MG tablet  Every 4 hours PRN     11/20/19 1849    bacitracin-polymyxin b (POLYSPORIN) ophthalmic ointment  Every 12 hours     11/20/19 1849           Henderly, Britni A, PA-C 11/20/19 1853  Tegeler, Gwenyth Allegra, MD 11/21/19 [phone]

## 2019-11-21 ENCOUNTER — Other Ambulatory Visit: Payer: BC Managed Care – PPO

## 2019-11-21 ENCOUNTER — Ambulatory Visit: Payer: BC Managed Care – PPO | Admitting: Nurse Practitioner

## 2019-11-21 ENCOUNTER — Other Ambulatory Visit: Payer: Self-pay | Admitting: Oncology

## 2019-11-21 DIAGNOSIS — D7581 Myelofibrosis: Secondary | ICD-10-CM

## 2019-11-22 ENCOUNTER — Telehealth: Payer: Self-pay

## 2019-11-22 ENCOUNTER — Telehealth: Payer: Self-pay | Admitting: Nurse Practitioner

## 2019-11-22 MED FILL — JAKAFI 15 MG TABLET: 15 | 30 days supply | Qty: 45 | Fill #0

## 2019-11-22 NOTE — Telephone Encounter (Signed)
TC to pt per Ned Card NP to let her know to continue the same dose of Jakafi. Patient verbalized understanding. No further problems or concerns at this time.

## 2019-11-22 NOTE — Telephone Encounter (Signed)
PROGRESS NOTE: Per Ned Card NP sent schedule message to schedulers to get patient scheduled for LAB and Doctors visit with lisa at 1:45p on 11/28/19

## 2019-11-22 NOTE — Telephone Encounter (Signed)
Scheduled appt per 12/30 sch message - pt did not want to schedule for 1/5 - wanted to wait til the end of January. Message sent to MD and RN

## 2019-11-28 ENCOUNTER — Other Ambulatory Visit: Payer: BC Managed Care – PPO

## 2019-11-28 ENCOUNTER — Ambulatory Visit: Payer: BC Managed Care – PPO | Admitting: Nurse Practitioner

## 2019-12-07 ENCOUNTER — Other Ambulatory Visit: Payer: Self-pay | Admitting: Oncology

## 2019-12-07 MED ORDER — HYDROCODONE-ACETAMINOPHEN 5-325 MG PO TABS: 1.0000 | ORAL_TABLET | ORAL | 0 refills | Status: DC | PRN

## 2019-12-18 ENCOUNTER — Inpatient Hospital Stay: Payer: BC Managed Care – PPO | Admitting: Nurse Practitioner

## 2019-12-18 ENCOUNTER — Other Ambulatory Visit: Payer: Self-pay

## 2019-12-18 ENCOUNTER — Encounter: Payer: Self-pay | Admitting: Nurse Practitioner

## 2019-12-18 ENCOUNTER — Inpatient Hospital Stay: Payer: BC Managed Care – PPO | Attending: Oncology

## 2019-12-18 VITALS — BP 155/52 | HR 72 | Temp 98.3°F | Resp 17 | Ht 69.0 in | Wt 170.4 lb

## 2019-12-18 DIAGNOSIS — R197 Diarrhea, unspecified: Secondary | ICD-10-CM | POA: Diagnosis not present

## 2019-12-18 DIAGNOSIS — D7581 Myelofibrosis: Secondary | ICD-10-CM

## 2019-12-18 DIAGNOSIS — R52 Pain, unspecified: Secondary | ICD-10-CM | POA: Diagnosis not present

## 2019-12-18 DIAGNOSIS — Z853 Personal history of malignant neoplasm of breast: Secondary | ICD-10-CM | POA: Diagnosis not present

## 2019-12-18 LAB — CBC WITH DIFFERENTIAL (CANCER CENTER ONLY)
Abs Immature Granulocytes: 0.73 10*3/uL — ABNORMAL HIGH (ref 0.00–0.07)
Basophils Absolute: 0 10*3/uL (ref 0.0–0.1)
Basophils Relative: 1 %
Eosinophils Absolute: 0 10*3/uL (ref 0.0–0.5)
Eosinophils Relative: 0 %
HCT: 29.1 % — ABNORMAL LOW (ref 36.0–46.0)
Hemoglobin: 9.3 g/dL — ABNORMAL LOW (ref 12.0–15.0)
Immature Granulocytes: 15 %
Lymphocytes Relative: 19 %
Lymphs Abs: 1 10*3/uL (ref 0.7–4.0)
MCH: 29.3 pg (ref 26.0–34.0)
MCHC: 32 g/dL (ref 30.0–36.0)
MCV: 91.8 fL (ref 80.0–100.0)
Monocytes Absolute: 0.4 10*3/uL (ref 0.1–1.0)
Monocytes Relative: 9 %
Neutro Abs: 2.8 10*3/uL (ref 1.7–7.7)
Neutrophils Relative %: 56 %
Platelet Count: 100 10*3/uL — ABNORMAL LOW (ref 150–400)
RBC: 3.17 MIL/uL — ABNORMAL LOW (ref 3.87–5.11)
RDW: 16.5 % — ABNORMAL HIGH (ref 11.5–15.5)
WBC Count: 5 10*3/uL (ref 4.0–10.5)
nRBC: 3 % — ABNORMAL HIGH (ref 0.0–0.2)

## 2019-12-18 LAB — SAMPLE TO BLOOD BANK

## 2019-12-18 NOTE — Progress Notes (Signed)
[place] OFFICE PROGRESS NOTE   Diagnosis: Myelofibrosis  INTERVAL HISTORY:   [name] returns for follow-up.  She continues ruxolitinib 15 mg alternating with 30 mg.  She sustained an eye injury last month.  Vision was not affected.  She denies nausea/vomiting.  She has mild diarrhea.  She denies bleeding.  No leg swelling or calf pain.  No shortness of breath or chest pain.  She denies fever.  Pain related to the spleen is better.  Bone pain may be slightly better.  She continues hydrocodone as needed.  Objective:  Vital signs in last 24 hours:  Blood pressure (!) 155/52, pulse 72, temperature 98.3 F (36.8 C), temperature source Temporal, resp. rate 17, height 5' 9"  (1.753 m), weight 170 lb 6.4 oz (77.3 kg), SpO2 100 %.    HEENT: No thrush or ulcers. GI: Abdomen soft and nontender.  Spleen palpable to just below the level of the umbilicus.  Spleen does not cross midline. Vascular: No leg edema.  Calves soft and nontender.  Skin: No rash.   Lab Results:  Lab Results  Component Value Date   WBC 5.0 12/18/2019   HGB 9.3 (L) 12/18/2019   HCT 29.1 (L) 12/18/2019   MCV 91.8 12/18/2019   PLT 100 (L) 12/18/2019   NEUTROABS PENDING 12/18/2019    Imaging:  No results found.  Medications: I have reviewed the patient's current medications.  Assessment/Plan: 1. Stage II left-sided breast cancer diagnosed in February 2000. Screening mammogram 10/05/2011 with suggestion of a subcentimeter oval mass on the left superiorly and no suspicious masses, calcifications or architectural distortion in the right breast. She underwent biopsy of the "asymmetric density of concern in the superior aspect of the right breast" on 10/13/2011. Pathology showed a fibroadenoma.  2. 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  3. 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 Bone pain, likely a rheumatic manifestation of myelofibrosis. 5. History of intermittent low-grade fever and "night sweats," likely systemic manifestations of myelofibrosis. 6. History of Nose bleeding-likely related to myelofibrosis with dysfunctional platelets 7. 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  8. 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 Increased low back pain July 2020  MRI lumbar spine 06/26/2019-diffuse T1 signal compatible with myelofibrosis, hyperintense T2 signal at T12 and L3, most likely hemangiomas  Bone scan 07/04/2019-diffuse increased and homogenous uptake compatible with myelofibrosis, no focal increased activity at T12 or L3   Disposition: Ms. [name] appears stable.  She continues ruxolitinib 15 mg alternating with 30 mg.  Pain associated with the spleen is better.  She will continue hydrocodone as needed for the bone pain.  We reviewed the CBC from today.  Counts are stable.  She understands to contact the office with signs/symptoms suggestive of progressive anemia or any bleeding.  She will return for lab and follow-up in 3 weeks.  Lattie Haw  Hideo Googe ANP/GNP-BC   12/18/2019  3:28 PM

## 2019-12-25 ENCOUNTER — Other Ambulatory Visit: Payer: Self-pay | Admitting: Oncology

## 2019-12-25 DIAGNOSIS — D7581 Myelofibrosis: Secondary | ICD-10-CM

## 2019-12-26 ENCOUNTER — Other Ambulatory Visit: Payer: Self-pay | Admitting: Oncology

## 2019-12-26 DIAGNOSIS — D7581 Myelofibrosis: Secondary | ICD-10-CM

## 2020-01-01 ENCOUNTER — Other Ambulatory Visit: Payer: Self-pay | Admitting: Oncology

## 2020-01-01 MED ORDER — HYDROCODONE-ACETAMINOPHEN 5-325 MG PO TABS: 1.0000 | ORAL_TABLET | ORAL | 0 refills | Status: DC | PRN

## 2020-01-02 MED FILL — JAKAFI 15 MG TABLET: 15 | 30 days supply | Qty: 45 | Fill #0

## 2020-01-10 ENCOUNTER — Telehealth: Payer: Self-pay | Admitting: Oncology

## 2020-01-10 NOTE — Telephone Encounter (Signed)
R/s 2/18 appt due to weather

## 2020-01-11 ENCOUNTER — Other Ambulatory Visit: Payer: Self-pay | Admitting: Nurse Practitioner

## 2020-01-11 ENCOUNTER — Telehealth: Payer: Self-pay | Admitting: Hematology

## 2020-01-11 ENCOUNTER — Telehealth: Payer: Self-pay | Admitting: Oncology

## 2020-01-11 ENCOUNTER — Inpatient Hospital Stay: Payer: BC Managed Care – PPO

## 2020-01-11 ENCOUNTER — Inpatient Hospital Stay: Payer: BC Managed Care – PPO | Admitting: Oncology

## 2020-01-11 DIAGNOSIS — D7581 Myelofibrosis: Secondary | ICD-10-CM

## 2020-01-11 NOTE — Telephone Encounter (Signed)
Per YF verbal. Pt is aware of appt cancellation 2/19

## 2020-01-11 NOTE — Telephone Encounter (Signed)
Per 2/18 sch msg. Pt is aware of appt date and time

## 2020-01-24 ENCOUNTER — Other Ambulatory Visit: Payer: Self-pay | Admitting: Oncology

## 2020-01-24 MED ORDER — HYDROCODONE-ACETAMINOPHEN 5-325 MG PO TABS: 1.0000 | ORAL_TABLET | ORAL | 0 refills | Status: DC | PRN

## 2020-01-25 ENCOUNTER — Other Ambulatory Visit: Payer: Self-pay | Admitting: Oncology

## 2020-01-25 DIAGNOSIS — D7581 Myelofibrosis: Secondary | ICD-10-CM

## 2020-01-29 ENCOUNTER — Encounter: Payer: Self-pay | Admitting: Oncology

## 2020-01-29 MED FILL — JAKAFI 15 MG TABLET: 15 | 30 days supply | Qty: 45 | Fill #0

## 2020-02-01 ENCOUNTER — Telehealth: Payer: Self-pay | Admitting: *Deleted

## 2020-02-01 ENCOUNTER — Inpatient Hospital Stay: Payer: BC Managed Care – PPO | Attending: Oncology

## 2020-02-01 ENCOUNTER — Other Ambulatory Visit: Payer: Self-pay

## 2020-02-01 DIAGNOSIS — R161 Splenomegaly, not elsewhere classified: Secondary | ICD-10-CM | POA: Insufficient documentation

## 2020-02-01 DIAGNOSIS — Z853 Personal history of malignant neoplasm of breast: Secondary | ICD-10-CM | POA: Diagnosis not present

## 2020-02-01 DIAGNOSIS — R110 Nausea: Secondary | ICD-10-CM | POA: Diagnosis not present

## 2020-02-01 DIAGNOSIS — D630 Anemia in neoplastic disease: Secondary | ICD-10-CM | POA: Diagnosis not present

## 2020-02-01 DIAGNOSIS — D7581 Myelofibrosis: Secondary | ICD-10-CM | POA: Insufficient documentation

## 2020-02-01 DIAGNOSIS — M898X9 Other specified disorders of bone, unspecified site: Secondary | ICD-10-CM | POA: Insufficient documentation

## 2020-02-01 LAB — CMP (CANCER CENTER ONLY)
ALT: 18 U/L (ref 0–44)
AST: 20 U/L (ref 15–41)
Albumin: 4.3 g/dL (ref 3.5–5.0)
Alkaline Phosphatase: 58 U/L (ref 38–126)
Anion gap: 10 (ref 5–15)
BUN: 16 mg/dL (ref 6–20)
CO2: 21 mmol/L — ABNORMAL LOW (ref 22–32)
Calcium: 8.7 mg/dL — ABNORMAL LOW (ref 8.9–10.3)
Chloride: 111 mmol/L (ref 98–111)
Creatinine: 1.06 mg/dL — ABNORMAL HIGH (ref 0.44–1.00)
GFR, Est AFR Am: >60 mL/min (ref >60)
GFR, Estimated: 59 mL/min — ABNORMAL LOW (ref >60)
Glucose, Bld: 88 mg/dL (ref 70–99)
Potassium: 4.1 mmol/L (ref 3.5–5.1)
Sodium: 142 mmol/L (ref 135–145)
Total Bilirubin: 0.5 mg/dL (ref 0.3–1.2)
Total Protein: 6.8 g/dL (ref 6.5–8.1)

## 2020-02-01 LAB — CBC WITH DIFFERENTIAL (CANCER CENTER ONLY)
Abs Immature Granulocytes: 0.88 10*3/uL — ABNORMAL HIGH (ref 0.00–0.07)
Basophils Absolute: 0.1 10*3/uL (ref 0.0–0.1)
Basophils Relative: 1 %
Eosinophils Absolute: 0 10*3/uL (ref 0.0–0.5)
Eosinophils Relative: 0 %
HCT: 29.2 % — ABNORMAL LOW (ref 36.0–46.0)
Hemoglobin: 9.3 g/dL — ABNORMAL LOW (ref 12.0–15.0)
Immature Granulocytes: 15 %
Lymphocytes Relative: 20 %
Lymphs Abs: 1.2 10*3/uL (ref 0.7–4.0)
MCH: 29.2 pg (ref 26.0–34.0)
MCHC: 31.8 g/dL (ref 30.0–36.0)
MCV: 91.5 fL (ref 80.0–100.0)
Monocytes Absolute: 0.6 10*3/uL (ref 0.1–1.0)
Monocytes Relative: 10 %
Neutro Abs: 3.2 10*3/uL (ref 1.7–7.7)
Neutrophils Relative %: 54 %
Platelet Count: 109 10*3/uL — ABNORMAL LOW (ref 150–400)
RBC: 3.19 MIL/uL — ABNORMAL LOW (ref 3.87–5.11)
RDW: 16.7 % — ABNORMAL HIGH (ref 11.5–15.5)
WBC Count: 5.9 10*3/uL (ref 4.0–10.5)
nRBC: 2.9 % — ABNORMAL HIGH (ref 0.0–0.2)

## 2020-02-01 LAB — SAMPLE TO BLOOD BANK

## 2020-02-01 NOTE — Telephone Encounter (Signed)
[facility] called, with blood bank hold. Pt 9.3 hgb Provider made aware

## 2020-02-06 ENCOUNTER — Inpatient Hospital Stay (HOSPITAL_BASED_OUTPATIENT_CLINIC_OR_DEPARTMENT_OTHER): Payer: BC Managed Care – PPO | Admitting: Oncology

## 2020-02-06 ENCOUNTER — Other Ambulatory Visit: Payer: Self-pay

## 2020-02-06 ENCOUNTER — Other Ambulatory Visit: Payer: Self-pay | Admitting: *Deleted

## 2020-02-06 VITALS — BP 164/86 | HR 73 | Temp 98.0°F | Resp 18 | Ht 69.0 in | Wt 172.2 lb

## 2020-02-06 DIAGNOSIS — D7581 Myelofibrosis: Secondary | ICD-10-CM

## 2020-02-06 MED ORDER — ZOLPIDEM TARTRATE 10 MG PO TABS: 10.0000 mg | ORAL_TABLET | Freq: Every evening | ORAL | 2 refills | Status: DC | PRN

## 2020-02-06 MED ORDER — ALPRAZOLAM 0.5 MG PO TABS: 0.5000 mg | ORAL_TABLET | Freq: Two times a day (BID) | ORAL | 2 refills | Status: DC | PRN

## 2020-02-06 NOTE — Progress Notes (Signed)
[place] OFFICE PROGRESS NOTE   Diagnosis: Myelofibrosis  INTERVAL HISTORY:   [name] continues ruxolitinib.  She reports improvement in splenomegaly and nausea.  Good appetite.  Stable bone pain.  No night sweats.  No new complaint.  She continues hydrocodone and ibuprofen as needed for bone pain.  Objective:  Vital signs in last 24 hours:  Blood pressure (!) 164/86, pulse 73, temperature 98 F (36.7 C), temperature source Temporal, resp. rate 18, height 5' 9" (1.753 m), weight 172 lb 3.2 oz (78.1 kg), SpO2 100 %.   Limited physical examination secondary to distancing with the Covid pandemic GI: No hepatomegaly.  The spleen tip is palpable at the level of the umbilicus Vascular: No leg edema   Lab Results:  Lab Results  Component Value Date   WBC 5.9 02/01/2020   HGB 9.3 (L) 02/01/2020   HCT 29.2 (L) 02/01/2020   MCV 91.5 02/01/2020   PLT 109 (L) 02/01/2020   NEUTROABS 3.2 02/01/2020    CMP  Lab Results  Component Value Date   NA 142 02/01/2020   K 4.1 02/01/2020   CL 111 02/01/2020   CO2 21 (L) 02/01/2020   GLUCOSE 88 02/01/2020   BUN 16 02/01/2020   CREATININE 1.06 (H) 02/01/2020   CALCIUM 8.7 (L) 02/01/2020   PROT 6.8 02/01/2020   ALBUMIN 4.3 02/01/2020   AST 20 02/01/2020   ALT 18 02/01/2020   ALKPHOS 58 02/01/2020   BILITOT 0.5 02/01/2020   GFRNONAA 59 (L) 02/01/2020   GFRAA >60 02/01/2020    Medications: I have reviewed the patient's current medications.   Assessment/Plan: 1. Stage II left-sided breast cancer diagnosed in February 2000. Screening mammogram 10/05/2011 with suggestion of a subcentimeter oval mass on the left superiorly and no suspicious masses, calcifications or architectural distortion in the right breast. She underwent biopsy of the "asymmetric density of concern in the superior aspect of the right breast" on 10/13/2011. Pathology showed a fibroadenoma.  2. History of thrombocytosis and anemia secondary to  myelofibrosis.  3. 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 Bone pain, likely a rheumatic manifestation of myelofibrosis. 5. History of intermittent low-grade fever and "night sweats," likely systemic manifestations of myelofibrosis. 6. History of Nose bleeding-likely related to myelofibrosis with dysfunctional platelets 7. 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  8. 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 Increased low back pain July 2020  MRI lumbar spine 06/26/2019-diffuse T1 signal compatible with myelofibrosis, hyperintense T2 signal at T12 and L3, most likely hemangiomas  Bone scan 07/04/2019-diffuse increased and homogenous uptake compatible with myelofibrosis, no focal increased activity at T12 or L3     Disposition: [name] appears stable.  She is tolerating the ruxolitinib well.  She has gained weight since starting ruxolitinib she is stable from a hematologic standpoint.  The plan is to continue ruxolitinib at the current  dose.  She will return for a lab visit in 6 weeks and an office visit in 12 weeks.  We will plan for a breast exam at the 12-week office visit.  Angela Coder, MD  02/06/2020  2:38 PM

## 2020-02-08 ENCOUNTER — Telehealth: Payer: Self-pay | Admitting: Oncology

## 2020-02-08 ENCOUNTER — Ambulatory Visit: Payer: BC Managed Care – PPO | Admitting: Oncology

## 2020-02-08 ENCOUNTER — Other Ambulatory Visit: Payer: BC Managed Care – PPO

## 2020-02-08 NOTE — Telephone Encounter (Signed)
Scheduled per los. Called and left msg. Mailed printout  °

## 2020-02-17 ENCOUNTER — Ambulatory Visit: Payer: BC Managed Care – PPO | Attending: Internal Medicine

## 2020-02-17 DIAGNOSIS — Z23 Encounter for immunization: Secondary | ICD-10-CM

## 2020-02-17 NOTE — Progress Notes (Signed)
   Covid-19 Vaccination Clinic  Name:  [name]    MRN: [id number] DOB: 03-17-1966  02/17/2020  [name] was observed post Covid-19 immunization for 15 minutes without incident. She was provided with Vaccine Information Sheet and instruction to access the V-Safe system.   Ms. Stay was instructed to call 911 with any severe reactions post vaccine: Marland Kitchen Difficulty breathing  . Swelling of face and throat  . A fast heartbeat  . A bad rash all over body  . Dizziness and weakness   Immunizations Administered    Name Date Dose VIS Date Route   Pfizer COVID-19 Vaccine 02/17/2020  4:45 PM 0.3 mL 11/03/2019 Intramuscular   Manufacturer: [place]   Lot: [id number]   [place]: [id number]

## 2020-02-19 ENCOUNTER — Other Ambulatory Visit: Payer: Self-pay | Admitting: Oncology

## 2020-02-19 MED ORDER — HYDROCODONE-ACETAMINOPHEN 5-325 MG PO TABS: 1.0000 | ORAL_TABLET | ORAL | 0 refills | Status: DC | PRN

## 2020-02-27 ENCOUNTER — Telehealth: Payer: Self-pay | Admitting: Nurse Practitioner

## 2020-02-27 ENCOUNTER — Telehealth: Payer: BC Managed Care – PPO | Admitting: Physician Assistant

## 2020-02-27 DIAGNOSIS — R399 Unspecified symptoms and signs involving the genitourinary system: Secondary | ICD-10-CM

## 2020-02-27 MED ORDER — NITROFURANTOIN MONOHYD MACRO 100 MG PO CAPS: 100.0000 mg | ORAL_CAPSULE | Freq: Two times a day (BID) | ORAL | 0 refills | Status: AC

## 2020-02-27 NOTE — Progress Notes (Signed)

## 2020-02-27 NOTE — Telephone Encounter (Signed)
Called pt per 4/6 sch message - unable to reach pt - left message for patient to call back to reschedule.

## 2020-02-28 ENCOUNTER — Other Ambulatory Visit: Payer: Self-pay | Admitting: Oncology

## 2020-02-28 DIAGNOSIS — D7581 Myelofibrosis: Secondary | ICD-10-CM

## 2020-02-29 MED FILL — JAKAFI 15 MG TABLET: 15 | 30 days supply | Qty: 45 | Fill #0

## 2020-03-11 ENCOUNTER — Other Ambulatory Visit: Payer: Self-pay | Admitting: Oncology

## 2020-03-11 MED ORDER — HYDROCODONE-ACETAMINOPHEN 5-325 MG PO TABS: 1.0000 | ORAL_TABLET | ORAL | 0 refills | Status: DC | PRN

## 2020-03-13 ENCOUNTER — Ambulatory Visit: Payer: BC Managed Care – PPO | Attending: Internal Medicine

## 2020-03-13 DIAGNOSIS — Z23 Encounter for immunization: Secondary | ICD-10-CM

## 2020-03-13 NOTE — Progress Notes (Signed)
   Covid-19 Vaccination Clinic  Name:  [name]    MRN: [id number] DOB: November 25, 1965  03/13/2020  [name] was observed post Covid-19 immunization for 15 minutes without incident. She was provided with Vaccine Information Sheet and instruction to access the V-Safe system.   [name] was instructed to call 911 with any severe reactions post vaccine: Marland Kitchen Difficulty breathing  . Swelling of face and throat  . A fast heartbeat  . A bad rash all over body  . Dizziness and weakness   Immunizations Administered    Name Date Dose VIS Date Route   Pfizer COVID-19 Vaccine 03/13/2020  3:32 PM 0.3 mL 01/17/2019 Intramuscular   Manufacturer: [place]   Lot: [id number]   [place]: [id number]

## 2020-03-19 ENCOUNTER — Other Ambulatory Visit: Payer: BC Managed Care – PPO

## 2020-03-22 ENCOUNTER — Other Ambulatory Visit: Payer: Self-pay

## 2020-03-22 ENCOUNTER — Inpatient Hospital Stay: Payer: BC Managed Care – PPO | Attending: Oncology

## 2020-03-22 ENCOUNTER — Telehealth: Payer: Self-pay | Admitting: *Deleted

## 2020-03-22 DIAGNOSIS — D7581 Myelofibrosis: Secondary | ICD-10-CM | POA: Insufficient documentation

## 2020-03-22 LAB — CMP (CANCER CENTER ONLY)
ALT: 20 U/L (ref 0–44)
AST: 22 U/L (ref 15–41)
Albumin: 4.1 g/dL (ref 3.5–5.0)
Alkaline Phosphatase: 73 U/L (ref 38–126)
Anion gap: 11 (ref 5–15)
BUN: 10 mg/dL (ref 6–20)
CO2: 23 mmol/L (ref 22–32)
Calcium: 8.8 mg/dL — ABNORMAL LOW (ref 8.9–10.3)
Chloride: 107 mmol/L (ref 98–111)
Creatinine: 0.99 mg/dL (ref 0.44–1.00)
GFR, Est AFR Am: >60 mL/min (ref >60)
GFR, Estimated: >60 mL/min (ref >60)
Glucose, Bld: 95 mg/dL (ref 70–99)
Potassium: 3.6 mmol/L (ref 3.5–5.1)
Sodium: 141 mmol/L (ref 135–145)
Total Bilirubin: 0.5 mg/dL (ref 0.3–1.2)
Total Protein: 7 g/dL (ref 6.5–8.1)

## 2020-03-22 LAB — CBC WITH DIFFERENTIAL (CANCER CENTER ONLY)
Abs Immature Granulocytes: 0.71 10*3/uL — ABNORMAL HIGH (ref 0.00–0.07)
Basophils Absolute: 0 10*3/uL (ref 0.0–0.1)
Basophils Relative: 1 %
Eosinophils Absolute: 0 10*3/uL (ref 0.0–0.5)
Eosinophils Relative: 0 %
HCT: 30.6 % — ABNORMAL LOW (ref 36.0–46.0)
Hemoglobin: 9.5 g/dL — ABNORMAL LOW (ref 12.0–15.0)
Immature Granulocytes: 12 %
Lymphocytes Relative: 23 %
Lymphs Abs: 1.3 10*3/uL (ref 0.7–4.0)
MCH: 29.1 pg (ref 26.0–34.0)
MCHC: 31 g/dL (ref 30.0–36.0)
MCV: 93.9 fL (ref 80.0–100.0)
Monocytes Absolute: 0.5 10*3/uL (ref 0.1–1.0)
Monocytes Relative: 8 %
Neutro Abs: 3.3 10*3/uL (ref 1.7–7.7)
Neutrophils Relative %: 56 %
Platelet Count: 124 10*3/uL — ABNORMAL LOW (ref 150–400)
RBC: 3.26 MIL/uL — ABNORMAL LOW (ref 3.87–5.11)
RDW: 16.5 % — ABNORMAL HIGH (ref 11.5–15.5)
WBC Count: 5.9 10*3/uL (ref 4.0–10.5)
nRBC: 2.9 % — ABNORMAL HIGH (ref 0.0–0.2)

## 2020-03-22 LAB — LIPID PANEL
Cholesterol: 131 mg/dL (ref 0–200)
HDL: 24 mg/dL — ABNORMAL LOW (ref >40)
LDL Cholesterol: UNDETERMINED mg/dL (ref 0–99)
Total CHOL/HDL Ratio: 5.5 RATIO
Triglycerides: 417 mg/dL — ABNORMAL HIGH (ref <150)
VLDL: UNDETERMINED mg/dL (ref 0–40)

## 2020-03-22 NOTE — Telephone Encounter (Signed)
-----   Message from Ladell Pier, MD sent at 03/22/2020  3:24 PM EDT ----- Please call patient, continue Jakafi at current dose, f/u as scheduled

## 2020-03-22 NOTE — Telephone Encounter (Signed)
Notified of CBC results and to continue same dose of Jakafi. F/U as scheduled.

## 2020-03-23 LAB — LDL CHOLESTEROL, DIRECT: Direct LDL: 60.6 mg/dL (ref 0–99)

## 2020-03-24 ENCOUNTER — Other Ambulatory Visit: Payer: Self-pay | Admitting: Oncology

## 2020-03-24 DIAGNOSIS — D7581 Myelofibrosis: Secondary | ICD-10-CM

## 2020-03-25 ENCOUNTER — Telehealth: Payer: Self-pay | Admitting: *Deleted

## 2020-03-25 DIAGNOSIS — D7581 Myelofibrosis: Secondary | ICD-10-CM

## 2020-03-25 NOTE — Telephone Encounter (Signed)
-----   Message from Ladell Pier, MD sent at 03/25/2020  1:44 PM EDT ----- Please call patient, triglycerides are moderately elevated, likely related to [facility]  She needs to start a lower fat diet, please asked nutrition to call her to discuss diet recommendations for hypertriglyceridemia

## 2020-03-25 NOTE — Telephone Encounter (Signed)
Notified of elevated triglycerides and to attempt to consume a lower fat diet. Likley due to the [place]. Referral to dietician made for education on this area. She understands and agrees. Referral to dietician ordered.

## 2020-03-26 ENCOUNTER — Telehealth: Payer: Self-pay | Admitting: Oncology

## 2020-03-26 NOTE — Telephone Encounter (Signed)
Scheduled appt per 5/3 sch message- unable to reach pt - left message with appt date and time

## 2020-03-27 ENCOUNTER — Telehealth: Payer: Self-pay | Admitting: *Deleted

## 2020-03-27 ENCOUNTER — Other Ambulatory Visit: Payer: Self-pay | Admitting: Nurse Practitioner

## 2020-03-27 DIAGNOSIS — D7581 Myelofibrosis: Secondary | ICD-10-CM

## 2020-03-27 MED ORDER — HYDROCODONE-ACETAMINOPHEN 5-325 MG PO TABS: 1.0000 | ORAL_TABLET | ORAL | 0 refills | Status: DC | PRN

## 2020-03-27 NOTE — Telephone Encounter (Signed)
Left message requesting refill on the vicodin. Is asking sooner than usual, but is leaving town next week to spend 2 weeks with her mother who is having surgery at [facility]. Wants to be able to p/u before leaving town.

## 2020-03-29 ENCOUNTER — Telehealth: Payer: Self-pay | Admitting: Nutrition

## 2020-04-01 ENCOUNTER — Telehealth: Payer: Self-pay

## 2020-04-01 ENCOUNTER — Inpatient Hospital Stay: Payer: BC Managed Care – PPO | Admitting: Nutrition

## 2020-04-02 ENCOUNTER — Inpatient Hospital Stay: Payer: BC Managed Care – PPO | Attending: Oncology

## 2020-04-02 NOTE — Progress Notes (Signed)
Nutrition Assessment   Reason for Assessment:   Diet education    ASSESSMENT:   54 year old female with myelofibrosis, followed by Dr. Benay Spice.  Past medical history of leukemia in 2007, left breast cancer 2000.  Patient currently on jakafi.    Spoke with patient via phone for diet education.  Patient reports good/normal appetite.     Medications: jakafi   Labs: HDL 24, Triglycerides 417   Anthropometrics:   Height: 69 inches Weight: 172 lb 3.2 oz on 3/16 Weight increasing BMI: 25   NUTRITION DIAGNOSIS: Food and nutrition related knowledge deficit related to elevated triglycerides and needing education on lowfat diet   INTERVENTION:  Reviewed Triglycerides Nutrition Therapy from [facility] and strategies to lower fat in diet.  Will mail handout.   Questions answered.   Contact information provided.   Next Visit: no follow-up planned.  Patient to contact RD with questions  Zivah Mayr B. Zenia Resides, [place], [place] Registered Dietitian [phone] (pager)

## 2020-04-10 ENCOUNTER — Other Ambulatory Visit: Payer: Self-pay | Admitting: Oncology

## 2020-04-10 DIAGNOSIS — D7581 Myelofibrosis: Secondary | ICD-10-CM

## 2020-04-17 MED FILL — JAKAFI 15 MG TABLET: 15 | 30 days supply | Qty: 45 | Fill #0

## 2020-04-23 ENCOUNTER — Other Ambulatory Visit: Payer: Self-pay | Admitting: Oncology

## 2020-04-23 DIAGNOSIS — D7581 Myelofibrosis: Secondary | ICD-10-CM

## 2020-04-23 MED ORDER — HYDROCODONE-ACETAMINOPHEN 5-325 MG PO TABS: 1.0000 | ORAL_TABLET | ORAL | 0 refills | Status: DC | PRN

## 2020-04-30 ENCOUNTER — Inpatient Hospital Stay: Payer: BC Managed Care – PPO

## 2020-04-30 ENCOUNTER — Inpatient Hospital Stay: Payer: BC Managed Care – PPO | Attending: Oncology | Admitting: Nurse Practitioner

## 2020-04-30 ENCOUNTER — Encounter: Payer: Self-pay | Admitting: Nurse Practitioner

## 2020-04-30 VITALS — BP 166/84 | HR 80 | Temp 97.8°F | Resp 18 | Ht 69.0 in | Wt 171.2 lb

## 2020-04-30 DIAGNOSIS — Z853 Personal history of malignant neoplasm of breast: Secondary | ICD-10-CM | POA: Insufficient documentation

## 2020-04-30 DIAGNOSIS — D7581 Myelofibrosis: Secondary | ICD-10-CM

## 2020-04-30 DIAGNOSIS — M898X9 Other specified disorders of bone, unspecified site: Secondary | ICD-10-CM | POA: Insufficient documentation

## 2020-04-30 DIAGNOSIS — R5383 Other fatigue: Secondary | ICD-10-CM | POA: Diagnosis not present

## 2020-04-30 DIAGNOSIS — R161 Splenomegaly, not elsewhere classified: Secondary | ICD-10-CM | POA: Diagnosis not present

## 2020-04-30 LAB — CMP (CANCER CENTER ONLY)
ALT: 27 U/L (ref 0–44)
AST: 22 U/L (ref 15–41)
Albumin: 4.4 g/dL (ref 3.5–5.0)
Alkaline Phosphatase: 73 U/L (ref 38–126)
Anion gap: 10 (ref 5–15)
BUN: 13 mg/dL (ref 6–20)
CO2: 22 mmol/L (ref 22–32)
Calcium: 9.4 mg/dL (ref 8.9–10.3)
Chloride: 108 mmol/L (ref 98–111)
Creatinine: 0.85 mg/dL (ref 0.44–1.00)
GFR, Est AFR Am: >60 mL/min (ref >60)
GFR, Estimated: >60 mL/min (ref >60)
Glucose, Bld: 103 mg/dL — ABNORMAL HIGH (ref 70–99)
Potassium: 3.9 mmol/L (ref 3.5–5.1)
Sodium: 140 mmol/L (ref 135–145)
Total Bilirubin: 0.6 mg/dL (ref 0.3–1.2)
Total Protein: 7.2 g/dL (ref 6.5–8.1)

## 2020-04-30 LAB — CBC WITH DIFFERENTIAL (CANCER CENTER ONLY)
Abs Immature Granulocytes: 1.01 10*3/uL — ABNORMAL HIGH (ref 0.00–0.07)
Basophils Absolute: 0.1 10*3/uL (ref 0.0–0.1)
Basophils Relative: 1 %
Eosinophils Absolute: 0 10*3/uL (ref 0.0–0.5)
Eosinophils Relative: 0 %
HCT: 29.2 % — ABNORMAL LOW (ref 36.0–46.0)
Hemoglobin: 9.4 g/dL — ABNORMAL LOW (ref 12.0–15.0)
Immature Granulocytes: 16 %
Lymphocytes Relative: 17 %
Lymphs Abs: 1.1 10*3/uL (ref 0.7–4.0)
MCH: 30.1 pg (ref 26.0–34.0)
MCHC: 32.2 g/dL (ref 30.0–36.0)
MCV: 93.6 fL (ref 80.0–100.0)
Monocytes Absolute: 0.6 10*3/uL (ref 0.1–1.0)
Monocytes Relative: 10 %
Neutro Abs: 3.5 10*3/uL (ref 1.7–7.7)
Neutrophils Relative %: 56 %
Platelet Count: 107 10*3/uL — ABNORMAL LOW (ref 150–400)
RBC: 3.12 MIL/uL — ABNORMAL LOW (ref 3.87–5.11)
RDW: 17.1 % — ABNORMAL HIGH (ref 11.5–15.5)
WBC Count: 6.3 10*3/uL (ref 4.0–10.5)
nRBC: 3 % — ABNORMAL HIGH (ref 0.0–0.2)

## 2020-04-30 LAB — LDL CHOLESTEROL, DIRECT: Direct LDL: 67.5 mg/dL (ref 0–99)

## 2020-04-30 LAB — LIPID PANEL
Cholesterol: 142 mg/dL (ref 0–200)
HDL: 29 mg/dL — ABNORMAL LOW (ref >40)
LDL Cholesterol: 66 mg/dL (ref 0–99)
Total CHOL/HDL Ratio: 4.9 RATIO
Triglycerides: 234 mg/dL — ABNORMAL HIGH (ref <150)
VLDL: 47 mg/dL — ABNORMAL HIGH (ref 0–40)

## 2020-04-30 NOTE — Progress Notes (Addendum)
[name] OFFICE PROGRESS NOTE   Diagnosis: Myelofibrosis  INTERVAL HISTORY:   [name] returns for follow-up.  She continues ruxolitinib.  Nausea and abdominal pain continues to be improved.  She thinks bone pain is better as well.  Main complaint is fatigue.  Objective:  Vital signs in last 24 hours:  Blood pressure (!) 166/84, pulse 80, temperature 97.8 F (36.6 C), temperature source Temporal, resp. rate 18, height 5' 9"  (1.753 m), weight 171 lb 3.2 oz (77.7 kg), SpO2 100 %.    Lymphatics: No palpable cervical, supraclavicular or axillary lymph nodes. Resp: Lungs clear bilaterally.  Breath sounds are diminished at the left lower lung field. Cardio: Regular rate and rhythm. GI: No hepatomegaly.  Spleen palpable to the level of the umbilicus. Vascular: No leg edema. Breast: Status post left mastectomy with implant in place.  No evidence for chest wall tumor recurrence.  No mass in the right breast.   Lab Results:  Lab Results  Component Value Date   WBC 5.9 03/22/2020   HGB 9.5 (L) 03/22/2020   HCT 30.6 (L) 03/22/2020   MCV 93.9 03/22/2020   PLT 124 (L) 03/22/2020   NEUTROABS 3.3 03/22/2020    Imaging:  No results found.  Medications: I have reviewed the patient's current medications.  Assessment/Plan: 1. Stage II left-sided breast cancer diagnosed in February 2000. Screening mammogram 10/05/2011 with suggestion of a subcentimeter oval mass on the left superiorly and no suspicious masses, calcifications or architectural distortion in the right breast. She underwent biopsy of the "asymmetric density of concern in the superior aspect of the right breast" on 10/13/2011. Pathology showed a fibroadenoma.  2. 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  3. 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 Bone pain, likely a rheumatic manifestation of myelofibrosis. 5. History of intermittent low-grade fever and "night sweats," likely systemic manifestations of myelofibrosis. 6. History of Nose bleeding-likely related to myelofibrosis with dysfunctional platelets 7. 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  8. 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 Increased low back pain July 2020  MRI lumbar spine 06/26/2019-diffuse T1 signal compatible with myelofibrosis, hyperintense T2 signal at T12 and L3, most likely hemangiomas  Bone scan 07/04/2019-diffuse increased and homogenous uptake compatible with myelofibrosis, no focal increased activity at T12 or L3    Disposition: [name] appears stable.  She continues to note improvement in splenomegaly and nausea, possible improvement in bone pain.  She remains stable from a hematologic standpoint.  She will continue ruxolitinib at the current dose.   She will return for lab in 6 weeks and an office visit in 12 weeks.  Patient seen with Dr. Benay Spice.   Ned Card ANP/GNP-BC   04/30/2020  3:49 PM This was a shared visit with Ned Card.  [name] is stable from a hematologic standpoint.  She will continue the  ruxolitinib at the current dose.  Julieanne Manson, MD

## 2020-05-03 ENCOUNTER — Telehealth: Payer: Self-pay | Admitting: *Deleted

## 2020-05-03 DIAGNOSIS — D7581 Myelofibrosis: Secondary | ICD-10-CM

## 2020-05-03 MED ORDER — ZOLPIDEM TARTRATE 10 MG PO TABS: 10.0000 mg | ORAL_TABLET | Freq: Every evening | ORAL | 1 refills | Status: DC | PRN

## 2020-05-03 MED ORDER — ALPRAZOLAM 0.5 MG PO TABS: 0.5000 mg | ORAL_TABLET | Freq: Two times a day (BID) | ORAL | 1 refills | Status: DC | PRN

## 2020-05-03 NOTE — Telephone Encounter (Signed)
Needs refills on alprazolam and zolpidem. Called pharmacy and approved refill. Notified that refills have been called in. Offered to have our CSW reach out to talk about all the stress she is having with her health, her mom and her family obligations. She declined saying "I can handle it". Reassured her that it is OK to reach out for help. We will be here for her.

## 2020-05-15 ENCOUNTER — Other Ambulatory Visit: Payer: Self-pay | Admitting: Oncology

## 2020-05-15 DIAGNOSIS — D7581 Myelofibrosis: Secondary | ICD-10-CM

## 2020-05-15 MED FILL — JAKAFI 15 MG TABLET: 15 | 30 days supply | Qty: 45 | Fill #0

## 2020-05-20 ENCOUNTER — Other Ambulatory Visit: Payer: Self-pay | Admitting: Oncology

## 2020-05-20 DIAGNOSIS — D7581 Myelofibrosis: Secondary | ICD-10-CM

## 2020-05-20 MED ORDER — HYDROCODONE-ACETAMINOPHEN 5-325 MG PO TABS: 1.0000 | ORAL_TABLET | ORAL | 0 refills | Status: DC | PRN

## 2020-06-12 ENCOUNTER — Inpatient Hospital Stay: Payer: BC Managed Care – PPO | Attending: Oncology

## 2020-06-12 ENCOUNTER — Other Ambulatory Visit: Payer: Self-pay

## 2020-06-12 DIAGNOSIS — D7581 Myelofibrosis: Secondary | ICD-10-CM | POA: Diagnosis not present

## 2020-06-12 LAB — CBC WITH DIFFERENTIAL (CANCER CENTER ONLY)
Abs Immature Granulocytes: 1.07 10*3/uL — ABNORMAL HIGH (ref 0.00–0.07)
Basophils Absolute: 0.1 10*3/uL (ref 0.0–0.1)
Basophils Relative: 1 %
Eosinophils Absolute: 0 10*3/uL (ref 0.0–0.5)
Eosinophils Relative: 1 %
HCT: 33.3 % — ABNORMAL LOW (ref 36.0–46.0)
Hemoglobin: 10.5 g/dL — ABNORMAL LOW (ref 12.0–15.0)
Immature Granulocytes: 13 %
Lymphocytes Relative: 17 %
Lymphs Abs: 1.4 10*3/uL (ref 0.7–4.0)
MCH: 29.5 pg (ref 26.0–34.0)
MCHC: 31.5 g/dL (ref 30.0–36.0)
MCV: 93.5 fL (ref 80.0–100.0)
Monocytes Absolute: 0.9 10*3/uL (ref 0.1–1.0)
Monocytes Relative: 11 %
Neutro Abs: 4.9 10*3/uL (ref 1.7–7.7)
Neutrophils Relative %: 57 %
Platelet Count: 143 10*3/uL — ABNORMAL LOW (ref 150–400)
RBC: 3.56 MIL/uL — ABNORMAL LOW (ref 3.87–5.11)
RDW: 16.8 % — ABNORMAL HIGH (ref 11.5–15.5)
WBC Count: 8.5 10*3/uL (ref 4.0–10.5)
nRBC: 2.1 % — ABNORMAL HIGH (ref 0.0–0.2)

## 2020-06-12 LAB — CMP (CANCER CENTER ONLY)
ALT: 39 U/L (ref 0–44)
AST: 36 U/L (ref 15–41)
Albumin: 4.5 g/dL (ref 3.5–5.0)
Alkaline Phosphatase: 70 U/L (ref 38–126)
Anion gap: 9 (ref 5–15)
BUN: 16 mg/dL (ref 6–20)
CO2: 21 mmol/L — ABNORMAL LOW (ref 22–32)
Calcium: 9.8 mg/dL (ref 8.9–10.3)
Chloride: 108 mmol/L (ref 98–111)
Creatinine: 1.19 mg/dL — ABNORMAL HIGH (ref 0.44–1.00)
GFR, Est AFR Am: 59 mL/min — ABNORMAL LOW (ref >60)
GFR, Estimated: 51 mL/min — ABNORMAL LOW (ref >60)
Glucose, Bld: 79 mg/dL (ref 70–99)
Potassium: 4.1 mmol/L (ref 3.5–5.1)
Sodium: 138 mmol/L (ref 135–145)
Total Bilirubin: 0.6 mg/dL (ref 0.3–1.2)
Total Protein: 7.6 g/dL (ref 6.5–8.1)

## 2020-06-12 LAB — LIPID PANEL
Cholesterol: 148 mg/dL (ref 0–200)
HDL: 26 mg/dL — ABNORMAL LOW (ref >40)
LDL Cholesterol: UNDETERMINED mg/dL (ref 0–99)
Total CHOL/HDL Ratio: 5.7 RATIO
Triglycerides: 463 mg/dL — ABNORMAL HIGH (ref <150)
VLDL: UNDETERMINED mg/dL (ref 0–40)

## 2020-06-12 LAB — LDL CHOLESTEROL, DIRECT: Direct LDL: 57 mg/dL (ref 0–99)

## 2020-06-14 ENCOUNTER — Other Ambulatory Visit: Payer: Self-pay | Admitting: Oncology

## 2020-06-14 DIAGNOSIS — D7581 Myelofibrosis: Secondary | ICD-10-CM

## 2020-06-14 NOTE — Telephone Encounter (Signed)
refill 

## 2020-06-17 ENCOUNTER — Other Ambulatory Visit: Payer: Self-pay | Admitting: Oncology

## 2020-06-17 DIAGNOSIS — D7581 Myelofibrosis: Secondary | ICD-10-CM

## 2020-06-17 MED ORDER — HYDROCODONE-ACETAMINOPHEN 5-325 MG PO TABS: 1.0000 | ORAL_TABLET | ORAL | 0 refills | Status: DC | PRN

## 2020-06-19 ENCOUNTER — Other Ambulatory Visit: Payer: Self-pay | Admitting: Oncology

## 2020-06-19 DIAGNOSIS — D7581 Myelofibrosis: Secondary | ICD-10-CM

## 2020-06-27 ENCOUNTER — Other Ambulatory Visit: Payer: Self-pay | Admitting: Oncology

## 2020-06-27 DIAGNOSIS — D7581 Myelofibrosis: Secondary | ICD-10-CM

## 2020-07-01 ENCOUNTER — Other Ambulatory Visit: Payer: Self-pay

## 2020-07-01 DIAGNOSIS — D7581 Myelofibrosis: Secondary | ICD-10-CM

## 2020-07-01 MED ORDER — ZOLPIDEM TARTRATE 10 MG PO TABS: 10.0000 mg | ORAL_TABLET | Freq: Every evening | ORAL | 1 refills | Status: DC | PRN

## 2020-07-01 MED ORDER — ALPRAZOLAM 0.5 MG PO TABS: 0.5000 mg | ORAL_TABLET | Freq: Two times a day (BID) | ORAL | 1 refills | Status: DC | PRN

## 2020-07-01 MED FILL — JAKAFI 15 MG TABLET: 15 | 30 days supply | Qty: 45 | Fill #0

## 2020-07-11 ENCOUNTER — Other Ambulatory Visit: Payer: Self-pay | Admitting: Nurse Practitioner

## 2020-07-11 DIAGNOSIS — D7581 Myelofibrosis: Secondary | ICD-10-CM

## 2020-07-11 MED ORDER — HYDROCODONE-ACETAMINOPHEN 5-325 MG PO TABS: 1.0000 | ORAL_TABLET | ORAL | 0 refills | Status: DC | PRN

## 2020-07-22 ENCOUNTER — Other Ambulatory Visit: Payer: Self-pay | Admitting: Oncology

## 2020-07-22 DIAGNOSIS — D7581 Myelofibrosis: Secondary | ICD-10-CM

## 2020-07-25 MED FILL — JAKAFI 15 MG TABLET: 15 | 30 days supply | Qty: 45 | Fill #0

## 2020-07-30 ENCOUNTER — Inpatient Hospital Stay: Payer: BC Managed Care – PPO

## 2020-07-30 ENCOUNTER — Inpatient Hospital Stay: Payer: BC Managed Care – PPO | Admitting: Oncology

## 2020-07-31 ENCOUNTER — Telehealth: Payer: Self-pay | Admitting: Oncology

## 2020-07-31 NOTE — Telephone Encounter (Signed)
Called pt per 9/7 sch msg - no answer and no vmail.

## 2020-08-09 ENCOUNTER — Telehealth: Payer: Self-pay | Admitting: *Deleted

## 2020-08-09 ENCOUNTER — Other Ambulatory Visit: Payer: Self-pay | Admitting: Oncology

## 2020-08-09 DIAGNOSIS — D7581 Myelofibrosis: Secondary | ICD-10-CM

## 2020-08-09 MED ORDER — HYDROCODONE-ACETAMINOPHEN 5-325 MG PO TABS: 1.0000 | ORAL_TABLET | ORAL | 0 refills | Status: DC | PRN

## 2020-08-09 NOTE — Telephone Encounter (Signed)
Requesting refill on hydrocodone/apap. MD notified.

## 2020-08-10 ENCOUNTER — Telehealth: Payer: BC Managed Care – PPO | Admitting: Family

## 2020-08-10 DIAGNOSIS — J029 Acute pharyngitis, unspecified: Secondary | ICD-10-CM

## 2020-08-10 DIAGNOSIS — Z20818 Contact with and (suspected) exposure to other bacterial communicable diseases: Secondary | ICD-10-CM

## 2020-08-10 MED ORDER — AMOXICILLIN 500 MG PO CAPS
500.0000 mg | ORAL_CAPSULE | Freq: Two times a day (BID) | ORAL | 0 refills | Status: DC
Start: 2020-08-10 — End: 2020-09-26

## 2020-08-10 NOTE — Progress Notes (Signed)

## 2020-08-24 ENCOUNTER — Telehealth: Payer: BC Managed Care – PPO | Admitting: Nurse Practitioner

## 2020-08-24 DIAGNOSIS — J029 Acute pharyngitis, unspecified: Secondary | ICD-10-CM

## 2020-08-24 NOTE — Progress Notes (Signed)
Based on what you shared with me it looks like you have pharyngitis,that should be evaluated in a face to face office visit. According to your chart you were given pencillin on 08/10/20. If this has not resolved since taking penicillin, then you need to see your PCP for proper treatment.   NOTE: If you entered your credit card information for this eVisit, you will not be charged. You may see a "hold" on your card for the $35 but that hold will drop off and you will not have a charge processed.  If you are having a true medical emergency please call 911.     For an urgent face to face visit, [facility] has four urgent care centers for your convenience:   . [facility]    [phone]                  Get Driving Directions  [address], [place] [address] . 10 am to 8 pm Monday-Friday . 12 pm to 8 pm Saturday-Sunday   . [facility] [place]                  Get Driving Directions  [address], [place], [place] [address] . 8 am to 8 pm Monday-Friday . 9 am to 6 pm Saturday . 11 am to 6 pm Sunday   . [facility] [place]                  Get Driving Directions   [address], [place] [address] . 8 am to 8 pm Monday-Friday . 8 am to 4 pm Saturday-Sunday    . [facility] [place]                    Get Driving Directions  [phone]  [address] [place], [place] [address]  . Monday-Friday, 12 PM to 6 PM    Your e-visit answers were reviewed by a board certified advanced clinical practitioner to complete your personal care plan.  Thank you for using e-Visits.

## 2020-08-26 ENCOUNTER — Other Ambulatory Visit: Payer: Self-pay | Admitting: Oncology

## 2020-08-26 DIAGNOSIS — D7581 Myelofibrosis: Secondary | ICD-10-CM

## 2020-08-27 ENCOUNTER — Other Ambulatory Visit: Payer: Self-pay | Admitting: *Deleted

## 2020-08-27 DIAGNOSIS — D7581 Myelofibrosis: Secondary | ICD-10-CM

## 2020-08-27 MED ORDER — ZOLPIDEM TARTRATE 10 MG PO TABS: 10.0000 mg | ORAL_TABLET | Freq: Every evening | ORAL | 1 refills | Status: DC | PRN

## 2020-08-27 MED ORDER — ALPRAZOLAM 0.5 MG PO TABS: 0.5000 mg | ORAL_TABLET | Freq: Two times a day (BID) | ORAL | 1 refills | Status: DC | PRN

## 2020-08-27 NOTE — Progress Notes (Signed)
Received faxed refill request from [facility] for alprazolam and zolpidem.

## 2020-08-29 MED FILL — JAKAFI 15 MG TABLET: 15 | 30 days supply | Qty: 45 | Fill #0

## 2020-09-04 ENCOUNTER — Other Ambulatory Visit: Payer: Self-pay | Admitting: *Deleted

## 2020-09-04 ENCOUNTER — Other Ambulatory Visit: Payer: Self-pay | Admitting: Oncology

## 2020-09-04 DIAGNOSIS — D7581 Myelofibrosis: Secondary | ICD-10-CM

## 2020-09-04 MED ORDER — HYDROCODONE-ACETAMINOPHEN 5-325 MG PO TABS
1.0000 | ORAL_TABLET | ORAL | 0 refills | Status: DC | PRN
Start: 2020-09-04 — End: 2020-09-27

## 2020-09-04 NOTE — Telephone Encounter (Signed)
Left message requesting refill on hydrococone/apap. MD notified. Scheduling message sent to reschedule the appointment she cancelled in September.

## 2020-09-05 ENCOUNTER — Telehealth: Payer: Self-pay | Admitting: Oncology

## 2020-09-05 NOTE — Telephone Encounter (Signed)
Scheduled appt per 10/13 sch msg - pt aware of apt date and time

## 2020-09-20 ENCOUNTER — Other Ambulatory Visit: Payer: Self-pay | Admitting: Oncology

## 2020-09-20 ENCOUNTER — Other Ambulatory Visit: Payer: Self-pay | Admitting: *Deleted

## 2020-09-20 DIAGNOSIS — D7581 Myelofibrosis: Secondary | ICD-10-CM

## 2020-09-20 MED ORDER — IBUPROFEN 800 MG PO TABS: 800.0000 mg | ORAL_TABLET | Freq: Two times a day (BID) | ORAL | 0 refills | Status: DC

## 2020-09-25 ENCOUNTER — Telehealth: Payer: Self-pay | Admitting: *Deleted

## 2020-09-25 NOTE — Telephone Encounter (Signed)
Reminded patient she is having lipid levels checked tomorrow and needs to fast except water for 8 hours prior to lab.

## 2020-09-26 ENCOUNTER — Inpatient Hospital Stay: Payer: BC Managed Care – PPO

## 2020-09-26 ENCOUNTER — Inpatient Hospital Stay: Payer: BC Managed Care – PPO | Attending: Oncology | Admitting: Oncology

## 2020-09-26 ENCOUNTER — Other Ambulatory Visit: Payer: Self-pay

## 2020-09-26 VITALS — BP 161/87 | HR 74 | Temp 98.2°F | Resp 16 | Ht 69.0 in | Wt 172.1 lb

## 2020-09-26 DIAGNOSIS — D649 Anemia, unspecified: Secondary | ICD-10-CM | POA: Insufficient documentation

## 2020-09-26 DIAGNOSIS — Z853 Personal history of malignant neoplasm of breast: Secondary | ICD-10-CM | POA: Diagnosis not present

## 2020-09-26 DIAGNOSIS — D696 Thrombocytopenia, unspecified: Secondary | ICD-10-CM | POA: Insufficient documentation

## 2020-09-26 DIAGNOSIS — R161 Splenomegaly, not elsewhere classified: Secondary | ICD-10-CM | POA: Insufficient documentation

## 2020-09-26 DIAGNOSIS — D7581 Myelofibrosis: Secondary | ICD-10-CM | POA: Insufficient documentation

## 2020-09-26 DIAGNOSIS — M898X9 Other specified disorders of bone, unspecified site: Secondary | ICD-10-CM | POA: Insufficient documentation

## 2020-09-26 LAB — CBC WITH DIFFERENTIAL (CANCER CENTER ONLY)
Abs Immature Granulocytes: 1.23 10*3/uL — ABNORMAL HIGH (ref 0.00–0.07)
Basophils Absolute: 0.1 10*3/uL (ref 0.0–0.1)
Basophils Relative: 1 %
Eosinophils Absolute: 0 10*3/uL (ref 0.0–0.5)
Eosinophils Relative: 0 %
HCT: 31.2 % — ABNORMAL LOW (ref 36.0–46.0)
Hemoglobin: 10.1 g/dL — ABNORMAL LOW (ref 12.0–15.0)
Immature Granulocytes: 14 %
Lymphocytes Relative: 18 %
Lymphs Abs: 1.6 10*3/uL (ref 0.7–4.0)
MCH: 28.9 pg (ref 26.0–34.0)
MCHC: 32.4 g/dL (ref 30.0–36.0)
MCV: 89.1 fL (ref 80.0–100.0)
Monocytes Absolute: 0.8 10*3/uL (ref 0.1–1.0)
Monocytes Relative: 9 %
Neutro Abs: 5.3 10*3/uL (ref 1.7–7.7)
Neutrophils Relative %: 58 %
Platelet Count: 115 10*3/uL — ABNORMAL LOW (ref 150–400)
RBC: 3.5 MIL/uL — ABNORMAL LOW (ref 3.87–5.11)
RDW: 17.3 % — ABNORMAL HIGH (ref 11.5–15.5)
WBC Count: 8.9 10*3/uL (ref 4.0–10.5)
nRBC: 2 % — ABNORMAL HIGH (ref 0.0–0.2)

## 2020-09-26 LAB — CMP (CANCER CENTER ONLY)
ALT: 57 U/L — ABNORMAL HIGH (ref 0–44)
AST: 40 U/L (ref 15–41)
Albumin: 4.3 g/dL (ref 3.5–5.0)
Alkaline Phosphatase: 72 U/L (ref 38–126)
Anion gap: 7 (ref 5–15)
BUN: 16 mg/dL (ref 6–20)
CO2: 22 mmol/L (ref 22–32)
Calcium: 9.1 mg/dL (ref 8.9–10.3)
Chloride: 108 mmol/L (ref 98–111)
Creatinine: 0.86 mg/dL (ref 0.44–1.00)
GFR, Estimated: >60 mL/min (ref >60)
Glucose, Bld: 115 mg/dL — ABNORMAL HIGH (ref 70–99)
Potassium: 3.9 mmol/L (ref 3.5–5.1)
Sodium: 137 mmol/L (ref 135–145)
Total Bilirubin: 0.5 mg/dL (ref 0.3–1.2)
Total Protein: 7.4 g/dL (ref 6.5–8.1)

## 2020-09-26 LAB — LIPID PANEL
Cholesterol: 163 mg/dL (ref 0–200)
HDL: 33 mg/dL — ABNORMAL LOW (ref >40)
LDL Cholesterol: UNDETERMINED mg/dL (ref 0–99)
Total CHOL/HDL Ratio: 4.9 RATIO
Triglycerides: 432 mg/dL — ABNORMAL HIGH (ref <150)
VLDL: UNDETERMINED mg/dL (ref 0–40)

## 2020-09-26 LAB — LDL CHOLESTEROL, DIRECT: Direct LDL: 77.1 mg/dL (ref 0–99)

## 2020-09-26 NOTE — Progress Notes (Signed)
[place] OFFICE PROGRESS NOTE   Diagnosis: Myelofibrosis  INTERVAL HISTORY:   [name] returns as scheduled. She continues Music therapist. She feels this has helped the splenomegaly. She has malaise and bone pain. The bone pain is unchanged. No other complaint.  Objective:  Vital signs in last 24 hours:  Blood pressure (!) 161/87, pulse 74, temperature 98.2 F (36.8 C), temperature source Tympanic, resp. rate 16, height 5' 9"  (1.753 m), weight 172 lb 1.6 oz (78.1 kg), SpO2 100 %.    Resp: Lungs clear bilaterally Cardio: Regular rate and rhythm GI: No hepatomegaly, the spleen is palpable in the left abdomen extending to the umbilicus and a 1 or 2 fingers left of the midline Vascular: No leg edema   Lab Results:  Lab Results  Component Value Date   WBC 8.9 09/26/2020   HGB 10.1 (L) 09/26/2020   HCT 31.2 (L) 09/26/2020   MCV 89.1 09/26/2020   PLT 115 (L) 09/26/2020   NEUTROABS 5.3 09/26/2020    CMP  Lab Results  Component Value Date   NA 137 09/26/2020   K 3.9 09/26/2020   CL 108 09/26/2020   CO2 22 09/26/2020   GLUCOSE 115 (H) 09/26/2020   BUN 16 09/26/2020   CREATININE 0.86 09/26/2020   CALCIUM 9.1 09/26/2020   PROT 7.4 09/26/2020   ALBUMIN 4.3 09/26/2020   AST 40 09/26/2020   ALT 57 (H) 09/26/2020   ALKPHOS 72 09/26/2020   BILITOT 0.5 09/26/2020   GFRNONAA >60 09/26/2020   GFRAA 59 (L) 06/12/2020     Medications: I have reviewed the patient's current medications.   Assessment/Plan: 1. Stage II left-sided breast cancer diagnosed in February 2000. Screening mammogram 10/05/2011 with suggestion of a subcentimeter oval mass on the left superiorly and no suspicious masses, calcifications or architectural distortion in the right breast. She underwent biopsy of the "asymmetric density of concern in the superior aspect of the right breast" on 10/13/2011. Pathology showed a fibroadenoma.  2. History of thrombocytosis and anemia secondary to  myelofibrosis.  3. 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 Bone pain, likely a rheumatic manifestation of myelofibrosis. 5. History of intermittent low-grade fever and "night sweats," likely systemic manifestations of myelofibrosis. 6. History of Nose bleeding-likely related to myelofibrosis with dysfunctional platelets 7. 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  8. 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 Increased low back pain July 2020  MRI lumbar spine 06/26/2019-diffuse T1 signal compatible with myelofibrosis, hyperintense T2 signal at T12 and L3, most likely hemangiomas  Bone scan 07/04/2019-diffuse increased and homogenous uptake compatible with myelofibrosis, no focal increased activity at T12 or L3   Disposition: [name] appears stable. She is tolerating the ruxolitinib well. She feels this has helped the splenomegaly. She will continue ruxolitinib at the current dose. She'll be scheduled for lab visit in 2 months and an office  visit in 4 months. The hemoglobin is stable and she has persistent mild thrombocytopenia. Betsy Coder, MD  09/26/2020  3:57 PM

## 2020-09-27 ENCOUNTER — Telehealth: Payer: Self-pay | Admitting: Oncology

## 2020-09-27 ENCOUNTER — Other Ambulatory Visit: Payer: Self-pay | Admitting: Nurse Practitioner

## 2020-09-27 DIAGNOSIS — D7581 Myelofibrosis: Secondary | ICD-10-CM

## 2020-09-27 MED ORDER — HYDROCODONE-ACETAMINOPHEN 5-325 MG PO TABS: 1.0000 | ORAL_TABLET | ORAL | 0 refills | Status: DC | PRN

## 2020-09-27 NOTE — Telephone Encounter (Signed)
Scheduled appointments per 11/4 los. Spoke to patient who is aware of appointments dates and times.  °

## 2020-10-02 ENCOUNTER — Other Ambulatory Visit: Payer: Self-pay | Admitting: Oncology

## 2020-10-02 DIAGNOSIS — D7581 Myelofibrosis: Secondary | ICD-10-CM

## 2020-10-03 MED FILL — JAKAFI 15 MG TABLET: 15 | 30 days supply | Qty: 45 | Fill #0

## 2020-10-16 ENCOUNTER — Other Ambulatory Visit: Payer: Self-pay | Admitting: Oncology

## 2020-10-21 ENCOUNTER — Other Ambulatory Visit: Payer: Self-pay | Admitting: Oncology

## 2020-10-21 DIAGNOSIS — D7581 Myelofibrosis: Secondary | ICD-10-CM

## 2020-10-24 ENCOUNTER — Other Ambulatory Visit: Payer: Self-pay | Admitting: Nurse Practitioner

## 2020-10-24 ENCOUNTER — Telehealth: Payer: Self-pay | Admitting: *Deleted

## 2020-10-24 ENCOUNTER — Other Ambulatory Visit: Payer: Self-pay | Admitting: Oncology

## 2020-10-24 DIAGNOSIS — D7581 Myelofibrosis: Secondary | ICD-10-CM

## 2020-10-24 MED ORDER — HYDROCODONE-ACETAMINOPHEN 5-325 MG PO TABS: 1.0000 | ORAL_TABLET | ORAL | 0 refills | Status: DC | PRN

## 2020-10-24 NOTE — Telephone Encounter (Signed)
refill 

## 2020-10-24 NOTE — Telephone Encounter (Signed)
Left message requesting refill on hydrocodone/apap. MD notified.

## 2020-10-28 ENCOUNTER — Other Ambulatory Visit: Payer: Self-pay | Admitting: *Deleted

## 2020-10-28 DIAGNOSIS — D7581 Myelofibrosis: Secondary | ICD-10-CM

## 2020-10-28 MED ORDER — ZOLPIDEM TARTRATE 10 MG PO TABS: 10.0000 mg | ORAL_TABLET | Freq: Every evening | ORAL | 1 refills | Status: DC | PRN

## 2020-10-28 MED ORDER — ALPRAZOLAM 0.5 MG PO TABS: 0.5000 mg | ORAL_TABLET | Freq: Two times a day (BID) | ORAL | 1 refills | Status: DC | PRN

## 2020-10-28 NOTE — Telephone Encounter (Signed)
Faxed refill request for alprazolam and zolpidem.

## 2020-10-30 MED FILL — JAKAFI 15 MG TABLET: 15 | 30 days supply | Qty: 45 | Fill #0

## 2020-11-18 ENCOUNTER — Telehealth: Payer: Self-pay | Admitting: *Deleted

## 2020-11-18 ENCOUNTER — Other Ambulatory Visit: Payer: Self-pay | Admitting: Oncology

## 2020-11-18 DIAGNOSIS — D7581 Myelofibrosis: Secondary | ICD-10-CM

## 2020-11-18 MED ORDER — HYDROCODONE-ACETAMINOPHEN 5-325 MG PO TABS: 1.0000 | ORAL_TABLET | ORAL | 0 refills | Status: DC | PRN

## 2020-11-18 NOTE — Telephone Encounter (Signed)
Refill request for Hydrocodone-apap forwarded to MD.

## 2020-11-18 NOTE — Telephone Encounter (Signed)
refill 

## 2020-11-21 ENCOUNTER — Other Ambulatory Visit: Payer: Self-pay | Admitting: Oncology

## 2020-11-24 ENCOUNTER — Encounter (INDEPENDENT_AMBULATORY_CARE_PROVIDER_SITE_OTHER): Payer: Self-pay

## 2020-11-24 ENCOUNTER — Encounter: Payer: Self-pay | Admitting: Nurse Practitioner

## 2020-11-24 ENCOUNTER — Telehealth: Payer: Self-pay

## 2020-11-24 ENCOUNTER — Encounter: Payer: Self-pay | Admitting: Oncology

## 2020-11-24 ENCOUNTER — Telehealth: Payer: BC Managed Care – PPO | Admitting: Family

## 2020-11-24 DIAGNOSIS — U071 COVID-19: Secondary | ICD-10-CM

## 2020-11-24 MED ORDER — BENZONATATE 100 MG PO CAPS: 100.0000 mg | ORAL_CAPSULE | Freq: Three times a day (TID) | ORAL | 0 refills | Status: DC | PRN

## 2020-11-24 MED ORDER — ALBUTEROL SULFATE HFA 108 (90 BASE) MCG/ACT IN AERS: 2.0000 | INHALATION_SPRAY | Freq: Four times a day (QID) | RESPIRATORY_TRACT | 0 refills | Status: DC | PRN

## 2020-11-24 MED ORDER — DEXAMETHASONE 6 MG PO TABS: 6.0000 mg | ORAL_TABLET | Freq: Every day | ORAL | 0 refills | Status: DC

## 2020-11-24 NOTE — Telephone Encounter (Signed)
SOB worse with activity. No chest pain dry cough. Pain with deep breathing that has worsened since yesterday.  . Shortness of breath is the same:   continue to monitor at home  . If symptoms become severe, i.e. shortness of breath at rest, gasping for air, wheezing, call 911 and seek treatment in the ED . If cough remains the same or better: continue to treat with over the counter medications.  Hard candy or cough drops and drinking warm fluids. Adults can also use honey 2 tsp (10 ML) at bedtime.  Marland Kitchen Honey is not recommended for infants under one.If cough is becoming worse even with the use of over the counter medications and patient is not able to sleep at night, cough becomes productive with sputum that maybe yellow or green in color, contact PCP. Marland Kitchen  Poor appetite. No nausea. Has sore throat. Advised warm salt water gargles, acetaminophen or Ibuprofen for pain, Chloraseptic spray and hard candy.   . develops severe vomiting (more than 6 times a day and or >8 hours) and/or severe abdominal pain advise patient If appetite becomes worse:   encourage patient to drink fluids as tolerated, work their way up to bland solid food such as crackers, pretzels, soup, bread or applesauce and boiled starches.  . If patient is unable to tolerate any foods or liquids, notify PCP. Marland Kitchen  If patient to call 911 and seek treatment in ED   Pt verbalized understanding.

## 2020-11-24 NOTE — Progress Notes (Signed)
E-Visit for Corona Virus Screening  We are sorry you are not feeling well. We are here to help!  You have tested positive for COVID-19, meaning that you were infected with the novel coronavirus and could give the virus to others.  It is vitally important that you stay home so you do not spread it to others.      Please continue isolation at home, for at least 10 days since the start of your symptoms and until you have had 24 hours with no fever (without taking a fever reducer) and with improving of symptoms.  Most cases improve 10 days from onset but we have seen a small number of patients who have gotten worse after the 10 days.  Please be sure to watch for worsening symptoms and remain taking the proper precautions.   Go to the nearest hospital ED for assessment if fever/cough/breathlessness are severe or illness seems like a threat to life.    The following symptoms may appear 2-14 days after exposure: . Fever . Cough . Shortness of breath or difficulty breathing . Chills . Repeated shaking with chills . Muscle pain . Headache . Sore throat . New loss of taste or smell . Fatigue . Congestion or runny nose . Nausea or vomiting . Diarrhea  You have been enrolled in [place] for COVID-19. Daily you will receive a questionnaire within the [facility] website. Our COVID-19 response team will be monitoring your responses daily.  You can use medication such as A prescription cough medication called Tessalon Perles 100 mg. You may take 1-2 capsules every 8 hours as needed for cough and A prescription inhaler called Albuterol MDI 90 mcg /actuation 2 puffs every 4 hours as needed for shortness of breath, wheezing, cough. I have also send in dexamethasone  6 mg twice a day for 7 days.    We have asked the monoclonal antibody team contact you to discuss the infusion with you and potentially set this up. You should hear from them in the next 48 hours.  This is in a very low supply,  they will contact you if you qualify. I think you should given you are immune suppressed.    You may also take acetaminophen (Tylenol) as needed for fever.  HOME CARE: . Only take medications as instructed by your medical team. . Drink plenty of fluids and get plenty of rest. . A steam or ultrasonic humidifier can help if you have congestion.   GET HELP RIGHT AWAY IF YOU HAVE EMERGENCY WARNING SIGNS.  Call 911 or proceed to your closest emergency facility if: . You develop worsening high fever. . Trouble breathing . Bluish lips or face . Persistent pain or pressure in the chest . New confusion . Inability to wake or stay awake . You cough up blood. . Your symptoms become more severe . Inability to hold down food or fluids  This list is not all possible symptoms. Contact your medical provider for any symptoms that are severe or concerning to you.    Your e-visit answers were reviewed by a board certified advanced clinical practitioner to complete your personal care plan.  Depending on the condition, your plan could have included both over the counter or prescription medications.  If there is a problem please reply once you have received a response from your provider.  Your safety is important to [place].  If you have drug allergies check your prescription carefully.    You can use MyChart to ask  questions about today's visit, request a non-urgent call back, or ask for a work or school excuse for 24 hours related to this e-Visit. If it has been greater than 24 hours you will need to follow up with your provider, or enter a new e-Visit to address those concerns. You will get an e-mail in the next two days asking about your experience.  I hope that your e-visit has been valuable and will speed your recovery. Thank you for using e-visits.     Approximately 5 minutes was spent documenting and reviewing patient's chart.

## 2020-11-25 ENCOUNTER — Other Ambulatory Visit: Payer: BC Managed Care – PPO

## 2020-11-25 ENCOUNTER — Encounter: Payer: Self-pay | Admitting: *Deleted

## 2020-11-25 ENCOUNTER — Other Ambulatory Visit: Payer: Self-pay | Admitting: Infectious Diseases

## 2020-11-25 ENCOUNTER — Telehealth: Payer: Self-pay | Admitting: Infectious Diseases

## 2020-11-25 DIAGNOSIS — U071 COVID-19: Secondary | ICD-10-CM

## 2020-11-25 DIAGNOSIS — Z20822 Contact with and (suspected) exposure to covid-19: Secondary | ICD-10-CM | POA: Diagnosis not present

## 2020-11-25 NOTE — Telephone Encounter (Signed)
Called to discuss with patient about COVID-19 symptoms and the use of one of the available treatments for those with mild to moderate Covid symptoms and at a high risk of hospitalization.  Pt appears to qualify for outpatient treatment due to co-morbid conditions and/or a member of an at-risk group in accordance with the FDA Emergency Use Authorization.    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 Will schedule for tomorrow @ 12:30. Appt info provided.   Rexene Alberts, NP

## 2020-11-25 NOTE — Progress Notes (Signed)
I connected by phone with [name] on 11/25/2020 at 2:00 PM to discuss the potential use of a new treatment for mild to moderate COVID-19 viral infection in non-hospitalized patients.  This patient is a 55 y.o. female that meets the FDA criteria for Emergency Use Authorization of COVID monoclonal antibody casirivimab/imdevimab, bamlanivimab/etesevimab, or sotrovimab.  Has a (+) direct SARS-CoV-2 viral test result  Has mild or moderate COVID-19   Is NOT hospitalized due to COVID-19  Is within 10 days of symptom onset  Has at least one of the high risk factor(s) for progression to severe COVID-19 and/or hospitalization as defined in EUA.  Specific high risk criteria : BMI > 25, Immunosuppressive Disease or Treatment and Other high risk medical condition per CDC:  no booster vaccine   I have spoken and communicated the following to the patient or parent/caregiver regarding COVID monoclonal antibody treatment:  1. FDA has authorized the emergency use for the treatment of mild to moderate COVID-19 in adults and pediatric patients with positive results of direct SARS-CoV-2 viral testing who are 59 years of age and older weighing at least 40 kg, and who are at high risk for progressing to severe COVID-19 and/or hospitalization.  2. The significant known and potential risks and benefits of COVID monoclonal antibody, and the extent to which such potential risks and benefits are unknown.  3. Information on available alternative treatments and the risks and benefits of those alternatives, including clinical trials.  4. Patients treated with COVID monoclonal antibody should continue to self-isolate and use infection control measures (e.g., wear mask, isolate, social distance, avoid sharing personal items, clean and disinfect "high touch" surfaces, and frequent handwashing) according to CDC guidelines.   5. The patient or parent/caregiver has the option to accept or refuse COVID monoclonal  antibody treatment.  After reviewing this information with the patient, the patient has agreed to receive one of the available covid 19 monoclonal antibodies and will be provided an appropriate fact sheet prior to infusion.   Angela Alberts, NP 11/25/2020 2:00 PM

## 2020-11-26 ENCOUNTER — Ambulatory Visit (HOSPITAL_COMMUNITY)
Admission: RE | Admit: 2020-11-26 | Discharge: 2020-11-26 | Disposition: A | Discharge status: Home / Self Care | Payer: BC Managed Care – PPO | Source: Ambulatory Visit | Attending: Pulmonary Disease | Admitting: Pulmonary Disease

## 2020-11-26 ENCOUNTER — Telehealth: Payer: Self-pay | Admitting: Oncology

## 2020-11-26 DIAGNOSIS — D849 Immunodeficiency, unspecified: Secondary | ICD-10-CM | POA: Insufficient documentation

## 2020-11-26 DIAGNOSIS — U071 COVID-19: Secondary | ICD-10-CM | POA: Diagnosis not present

## 2020-11-26 LAB — NOVEL CORONAVIRUS, NAA: SARS-CoV-2, NAA: DETECTED — AB

## 2020-11-26 LAB — SARS-COV-2, NAA 2 DAY TAT

## 2020-11-26 MED ORDER — METHYLPREDNISOLONE SODIUM SUCC 125 MG IJ SOLR: 125.0000 mg | Freq: Once | INTRAMUSCULAR | Status: DC | PRN

## 2020-11-26 MED ORDER — SOTROVIMAB 500 MG/8ML IV SOLN
500.0000 mg | Freq: Once | INTRAVENOUS | Status: AC
  Administered 2020-11-26: 500 mg via INTRAVENOUS

## 2020-11-26 MED ORDER — SODIUM CHLORIDE 0.9 % IV SOLN: INTRAVENOUS | Status: DC | PRN

## 2020-11-26 MED ORDER — EPINEPHRINE 0.3 MG/0.3ML IJ SOAJ: 0.3000 mg | Freq: Once | INTRAMUSCULAR | Status: DC | PRN

## 2020-11-26 MED ORDER — DIPHENHYDRAMINE HCL 50 MG/ML IJ SOLN: 50.0000 mg | Freq: Once | INTRAMUSCULAR | Status: DC | PRN

## 2020-11-26 MED ORDER — ALBUTEROL SULFATE HFA 108 (90 BASE) MCG/ACT IN AERS: 2.0000 | INHALATION_SPRAY | Freq: Once | RESPIRATORY_TRACT | Status: DC | PRN

## 2020-11-26 MED ORDER — FAMOTIDINE IN NACL 20-0.9 MG/50ML-% IV SOLN: 20.0000 mg | Freq: Once | INTRAVENOUS | Status: DC | PRN

## 2020-11-26 NOTE — Telephone Encounter (Signed)
Scheduled appointment per 1/3 schedule message. Patient is aware.

## 2020-11-26 NOTE — Progress Notes (Signed)
Patient reviewed Fact Sheet for Patients, Parents, and Caregivers for Emergency Use Authorization (EUA) of sotrovimab for the Treatment of Coronavirus. Patient also reviewed and is agreeable to the estimated cost of treatment. Patient is agreeable to proceed.   

## 2020-11-26 NOTE — Progress Notes (Signed)
Diagnosis: COVID-19  Physician: Dr. Patrick Wright  Procedure: [facility]fusion [facility] Med: Sotrovimab infusion - Provided patient with sotrovimab fact sheet for patients, parents, and caregivers prior to infusion.   Complications: No immediate complications noted  Discharge: Discharged home    

## 2020-11-26 NOTE — Discharge Instructions (Signed)
10 Things You Can Do to Manage Your COVID-19 Symptoms at Home If you have possible or confirmed COVID-19: 1. Stay home from work and school. And stay away from other public places. If you must go out, avoid using any kind of public transportation, ridesharing, or taxis. 2. Monitor your symptoms carefully. If your symptoms get worse, call your healthcare provider immediately. 3. Get rest and stay hydrated. 4. If you have a medical appointment, call the healthcare provider ahead of time and tell them that you have or may have COVID-19. 5. For medical emergencies, call 911 and notify the dispatch personnel that you have or may have COVID-19. 6. Cover your cough and sneezes with a tissue or use the inside of your elbow. 7. Wash your hands often with soap and water for at least 20 seconds or clean your hands with an alcohol-based hand sanitizer that contains at least 60% alcohol. 8. As much as possible, stay in a specific room and away from other people in your home. Also, you should use a separate bathroom, if available. If you need to be around other people in or outside of the home, wear a mask. 9. Avoid sharing personal items with other people in your household, like dishes, towels, and bedding. 10. Clean all surfaces that are touched often, like counters, tabletops, and doorknobs. Use household cleaning sprays or wipes according to the label instructions. cdc.gov/coronavirus 05/24/2019 This information is not intended to replace advice given to you by your health care provider. Make sure you discuss any questions you have with your health care provider. Document Revised: 10/26/2019 Document Reviewed: 10/26/2019 Elsevier Patient Education  2020 Elsevier Inc. What types of side effects do monoclonal antibody drugs cause?  Common side effects  In general, the more common side effects caused by monoclonal antibody drugs include: . Allergic reactions, such as hives or itching . Flu-like signs and  symptoms, including chills, fatigue, fever, and muscle aches and pains . Nausea, vomiting . Diarrhea . Skin rashes . Low blood pressure   The CDC is recommending patients who receive monoclonal antibody treatments wait at least 90 days before being vaccinated.  Currently, there are no data on the safety and efficacy of mRNA COVID-19 vaccines in persons who received monoclonal antibodies or convalescent plasma as part of COVID-19 treatment. Based on the estimated half-life of such therapies as well as evidence suggesting that reinfection is uncommon in the 90 days after initial infection, vaccination should be deferred for at least 90 days, as a precautionary measure until additional information becomes available, to avoid interference of the antibody treatment with vaccine-induced immune responses. If you have any questions or concerns after the infusion please call the Advanced Practice Provider on call at [phone]. This number is ONLY intended for your use regarding questions or concerns about the infusion post-treatment side-effects.  Please do not provide this number to others for use. For return to work notes please contact your primary care provider.   If someone you know is interested in receiving treatment please have them call the COVID hotline at [phone].   

## 2020-11-27 ENCOUNTER — Other Ambulatory Visit: Payer: Self-pay | Admitting: Oncology

## 2020-11-27 ENCOUNTER — Other Ambulatory Visit: Payer: BC Managed Care – PPO

## 2020-11-27 DIAGNOSIS — D7581 Myelofibrosis: Secondary | ICD-10-CM

## 2020-12-01 ENCOUNTER — Telehealth: Payer: Self-pay

## 2020-12-01 ENCOUNTER — Encounter (INDEPENDENT_AMBULATORY_CARE_PROVIDER_SITE_OTHER): Payer: Self-pay

## 2020-12-01 NOTE — Telephone Encounter (Signed)
.   Last 24 hours 4 episodes of diarrhea.Diarrhea- mixed watery with stool. Brown stool. Crampy pain prior to diarrhea is relieved having BM. No fever. No signs of dehydration. . If diarrhea remains the same:  encourage patient to drink oral fluids and bland foods.  . Avoid alcohol, spicy foods, caffeine or fatty foods that could make diarrhea worse.  . Continue to monitor for signs of dehydration (increased thirst decreased urine output, yellow urine, dry skin, headache or dizziness).  . Advise patient to try OTC medication (Imodium, kaopectate, Pepto-Bismol) as per manufacturer's instructions.  . If worsening diarrhea occurs and becomes severe (6-7 bowel movements a day): notify PCP  . If diarrhea last greater than 7 days: notify PCP . If signs of dehydration occur (increased thirst, decreased urine output, yellow urine, dry skin, headache or dizziness) advise patient to call 911 and seek treatment in the ED  Pt verbalized understanding.

## 2020-12-03 MED FILL — JAKAFI 15 MG TABLET: 15 | 30 days supply | Qty: 45 | Fill #0

## 2020-12-11 ENCOUNTER — Telehealth: Payer: Self-pay | Admitting: Oncology

## 2020-12-11 DIAGNOSIS — D7581 Myelofibrosis: Secondary | ICD-10-CM

## 2020-12-13 ENCOUNTER — Other Ambulatory Visit: Payer: Self-pay | Admitting: Nurse Practitioner

## 2020-12-13 ENCOUNTER — Other Ambulatory Visit: Payer: Self-pay | Admitting: Oncology

## 2020-12-13 DIAGNOSIS — D7581 Myelofibrosis: Secondary | ICD-10-CM

## 2020-12-13 MED ORDER — HYDROCODONE-ACETAMINOPHEN 5-325 MG PO TABS: 1.0000 | ORAL_TABLET | ORAL | 0 refills | Status: DC | PRN

## 2020-12-14 ENCOUNTER — Other Ambulatory Visit: Payer: Self-pay | Admitting: Family

## 2020-12-14 DIAGNOSIS — U071 COVID-19: Secondary | ICD-10-CM

## 2020-12-16 ENCOUNTER — Other Ambulatory Visit: Payer: BC Managed Care – PPO

## 2020-12-16 NOTE — Telephone Encounter (Signed)
Please see refill request.

## 2020-12-17 NOTE — Telephone Encounter (Signed)
What does this mean? Can lisa work on this?

## 2020-12-27 ENCOUNTER — Other Ambulatory Visit: Payer: Self-pay | Admitting: Oncology

## 2020-12-27 DIAGNOSIS — D7581 Myelofibrosis: Secondary | ICD-10-CM

## 2020-12-27 MED ORDER — ALPRAZOLAM 0.5 MG PO TABS: 0.5000 mg | ORAL_TABLET | Freq: Two times a day (BID) | ORAL | 1 refills | Status: DC | PRN

## 2020-12-30 ENCOUNTER — Inpatient Hospital Stay: Payer: BC Managed Care – PPO | Attending: Oncology

## 2020-12-30 ENCOUNTER — Other Ambulatory Visit: Payer: Self-pay

## 2020-12-30 DIAGNOSIS — D7581 Myelofibrosis: Secondary | ICD-10-CM | POA: Insufficient documentation

## 2020-12-30 LAB — CMP (CANCER CENTER ONLY)
ALT: 19 U/L (ref 0–44)
AST: 19 U/L (ref 15–41)
Albumin: 4.6 g/dL (ref 3.5–5.0)
Alkaline Phosphatase: 63 U/L (ref 38–126)
Anion gap: 7 (ref 5–15)
BUN: 22 mg/dL — ABNORMAL HIGH (ref 6–20)
CO2: 19 mmol/L — ABNORMAL LOW (ref 22–32)
Calcium: 9.2 mg/dL (ref 8.9–10.3)
Chloride: 110 mmol/L (ref 98–111)
Creatinine: 1.1 mg/dL — ABNORMAL HIGH (ref 0.44–1.00)
GFR, Estimated: 59 mL/min — ABNORMAL LOW (ref >60)
Glucose, Bld: 109 mg/dL — ABNORMAL HIGH (ref 70–99)
Potassium: 4 mmol/L (ref 3.5–5.1)
Sodium: 136 mmol/L (ref 135–145)
Total Bilirubin: 0.5 mg/dL (ref 0.3–1.2)
Total Protein: 7.3 g/dL (ref 6.5–8.1)

## 2020-12-30 LAB — CBC WITH DIFFERENTIAL (CANCER CENTER ONLY)
Abs Immature Granulocytes: 1.12 10*3/uL — ABNORMAL HIGH (ref 0.00–0.07)
Basophils Absolute: 0.1 10*3/uL (ref 0.0–0.1)
Basophils Relative: 1 %
Eosinophils Absolute: 0 10*3/uL (ref 0.0–0.5)
Eosinophils Relative: 0 %
HCT: 29.7 % — ABNORMAL LOW (ref 36.0–46.0)
Hemoglobin: 9.6 g/dL — ABNORMAL LOW (ref 12.0–15.0)
Immature Granulocytes: 14 %
Lymphocytes Relative: 19 %
Lymphs Abs: 1.5 10*3/uL (ref 0.7–4.0)
MCH: 29 pg (ref 26.0–34.0)
MCHC: 32.3 g/dL (ref 30.0–36.0)
MCV: 89.7 fL (ref 80.0–100.0)
Monocytes Absolute: 0.8 10*3/uL (ref 0.1–1.0)
Monocytes Relative: 10 %
Neutro Abs: 4.3 10*3/uL (ref 1.7–7.7)
Neutrophils Relative %: 56 %
Platelet Count: 111 10*3/uL — ABNORMAL LOW (ref 150–400)
RBC: 3.31 MIL/uL — ABNORMAL LOW (ref 3.87–5.11)
RDW: 17.4 % — ABNORMAL HIGH (ref 11.5–15.5)
WBC Count: 7.8 10*3/uL (ref 4.0–10.5)
nRBC: 2.2 % — ABNORMAL HIGH (ref 0.0–0.2)

## 2020-12-30 LAB — LIPID PANEL
Cholesterol: 147 mg/dL (ref 0–200)
HDL: 29 mg/dL — ABNORMAL LOW (ref >40)
LDL Cholesterol: 63 mg/dL (ref 0–99)
Total CHOL/HDL Ratio: 5.1 RATIO
Triglycerides: 276 mg/dL — ABNORMAL HIGH (ref <150)
VLDL: 55 mg/dL — ABNORMAL HIGH (ref 0–40)

## 2020-12-31 ENCOUNTER — Telehealth: Payer: Self-pay

## 2020-12-31 NOTE — Telephone Encounter (Signed)
-----   Message from Ladell Pier, MD sent at 12/30/2020  4:49 PM EST ----- Please call patient, hemoglobin and platelets are stable, continue Jakafi at the same dose

## 2020-12-31 NOTE — Telephone Encounter (Signed)
na

## 2020-12-31 NOTE — Telephone Encounter (Signed)
Most recent lab results given along with medication jakafi instructions to continue taking at present dose

## 2021-01-03 ENCOUNTER — Other Ambulatory Visit: Payer: Self-pay | Admitting: Nurse Practitioner

## 2021-01-03 DIAGNOSIS — D7581 Myelofibrosis: Secondary | ICD-10-CM

## 2021-01-03 NOTE — Telephone Encounter (Signed)
Please see Rx refill 

## 2021-01-08 ENCOUNTER — Other Ambulatory Visit: Payer: Self-pay | Admitting: Nurse Practitioner

## 2021-01-08 DIAGNOSIS — D7581 Myelofibrosis: Secondary | ICD-10-CM

## 2021-01-09 NOTE — Telephone Encounter (Signed)
Refill request

## 2021-01-10 ENCOUNTER — Other Ambulatory Visit: Payer: Self-pay | Admitting: Oncology

## 2021-01-10 DIAGNOSIS — D7581 Myelofibrosis: Secondary | ICD-10-CM

## 2021-01-10 MED ORDER — HYDROCODONE-ACETAMINOPHEN 5-325 MG PO TABS: 1.0000 | ORAL_TABLET | ORAL | 0 refills | Status: DC | PRN

## 2021-01-23 ENCOUNTER — Other Ambulatory Visit: Payer: Self-pay | Admitting: Oncology

## 2021-01-23 DIAGNOSIS — D7581 Myelofibrosis: Secondary | ICD-10-CM

## 2021-01-24 ENCOUNTER — Other Ambulatory Visit: Payer: Self-pay | Admitting: *Deleted

## 2021-01-24 NOTE — Telephone Encounter (Signed)
Refilled zolpidem and xanax.

## 2021-01-30 ENCOUNTER — Inpatient Hospital Stay (HOSPITAL_BASED_OUTPATIENT_CLINIC_OR_DEPARTMENT_OTHER): Payer: BC Managed Care – PPO | Admitting: Oncology

## 2021-01-30 ENCOUNTER — Other Ambulatory Visit: Payer: Self-pay

## 2021-01-30 ENCOUNTER — Inpatient Hospital Stay: Payer: BC Managed Care – PPO | Attending: Oncology

## 2021-01-30 ENCOUNTER — Telehealth (HOSPITAL_COMMUNITY): Payer: Self-pay | Admitting: Cardiology

## 2021-01-30 VITALS — BP 160/95 | HR 85 | Temp 97.9°F | Resp 14 | Ht 69.0 in | Wt 173.8 lb

## 2021-01-30 DIAGNOSIS — D7581 Myelofibrosis: Secondary | ICD-10-CM

## 2021-01-30 DIAGNOSIS — Z8616 Personal history of COVID-19: Secondary | ICD-10-CM | POA: Insufficient documentation

## 2021-01-30 DIAGNOSIS — R0609 Other forms of dyspnea: Secondary | ICD-10-CM | POA: Insufficient documentation

## 2021-01-30 DIAGNOSIS — D649 Anemia, unspecified: Secondary | ICD-10-CM | POA: Insufficient documentation

## 2021-01-30 LAB — CBC WITH DIFFERENTIAL (CANCER CENTER ONLY)
Abs Immature Granulocytes: 1.11 10*3/uL — ABNORMAL HIGH (ref 0.00–0.07)
Basophils Absolute: 0.1 10*3/uL (ref 0.0–0.1)
Basophils Relative: 1 %
Eosinophils Absolute: 0 10*3/uL (ref 0.0–0.5)
Eosinophils Relative: 0 %
HCT: 31.2 % — ABNORMAL LOW (ref 36.0–46.0)
Hemoglobin: 9.7 g/dL — ABNORMAL LOW (ref 12.0–15.0)
Immature Granulocytes: 16 %
Lymphocytes Relative: 16 %
Lymphs Abs: 1.1 10*3/uL (ref 0.7–4.0)
MCH: 28.9 pg (ref 26.0–34.0)
MCHC: 31.1 g/dL (ref 30.0–36.0)
MCV: 92.9 fL (ref 80.0–100.0)
Monocytes Absolute: 0.8 10*3/uL (ref 0.1–1.0)
Monocytes Relative: 11 %
Neutro Abs: 3.9 10*3/uL (ref 1.7–7.7)
Neutrophils Relative %: 56 %
Platelet Count: 122 10*3/uL — ABNORMAL LOW (ref 150–400)
RBC: 3.36 MIL/uL — ABNORMAL LOW (ref 3.87–5.11)
RDW: 18.4 % — ABNORMAL HIGH (ref 11.5–15.5)
WBC Count: 7 10*3/uL (ref 4.0–10.5)
nRBC: 2.6 % — ABNORMAL HIGH (ref 0.0–0.2)

## 2021-01-30 LAB — CMP (CANCER CENTER ONLY)
ALT: 24 U/L (ref 0–44)
AST: 23 U/L (ref 15–41)
Albumin: 4.4 g/dL (ref 3.5–5.0)
Alkaline Phosphatase: 68 U/L (ref 38–126)
Anion gap: 8 (ref 5–15)
BUN: 14 mg/dL (ref 6–20)
CO2: 21 mmol/L — ABNORMAL LOW (ref 22–32)
Calcium: 9.2 mg/dL (ref 8.9–10.3)
Chloride: 109 mmol/L (ref 98–111)
Creatinine: 0.86 mg/dL (ref 0.44–1.00)
GFR, Estimated: >60 mL/min (ref >60)
Glucose, Bld: 88 mg/dL (ref 70–99)
Potassium: 3.9 mmol/L (ref 3.5–5.1)
Sodium: 138 mmol/L (ref 135–145)
Total Bilirubin: 0.5 mg/dL (ref 0.3–1.2)
Total Protein: 7.3 g/dL (ref 6.5–8.1)

## 2021-01-30 NOTE — Telephone Encounter (Signed)
-----   Message from Consuelo Pandy, [place] sent at 01/30/2021  4:32 PM EST ----- Madaline Brilliant, It looks like Dr. Aundra Dubin has a few open slots on his schedule tomorrow. Our schedulers will call her to get her plugged in.   ----- Message ----- From: Ladell Pier, MD Sent: 01/30/2021   4:29 PM EST To: Consuelo Pandy, PA-C  This is the patient we just talked about, very nice lady with myelofibrosis  Has developed exertional dyspnea over the past 3 weeks  Thanks for seeing her, let me know if you would like me to order any studies  I will put a referral in epic   Thanks,  Julieanne Manson

## 2021-01-30 NOTE — Progress Notes (Signed)
Oxygen sat 99% w/pulse 89 at rest. Oxygen sat went down to 90% w/pulse up to 118 with ambulation and she verbalized she is feeling short of breath.

## 2021-01-30 NOTE — Telephone Encounter (Signed)
[phone] Jerilynn Mages) LMOM

## 2021-01-30 NOTE — Progress Notes (Signed)
[facility] OFFICE PROGRESS NOTE   Diagnosis: Myelofibrosis  INTERVAL HISTORY:   Ms. Minner returns for a scheduled visit.  She continues Music therapist.  She reports stable bone pain.  She takes hydrocodone 2-3 times per day and ibuprofen once daily. She reports being diagnosed with COVID-19 on 11/23/2020.  She had a sore throat and no other symptoms.  Ms. Sermeno has developed exertional dyspnea for the past 3 weeks.  She reports dyspnea when going up hills.  No dyspnea at rest.  No cough, chest pain, fever, leg pain, or leg swelling.  Splenomegaly has not changed.  She sleeps for extended periods of time.    Objective:  Vital signs in last 24 hours:  Blood pressure (!) 160/95, pulse 85, temperature 97.9 F (36.6 C), temperature source Tympanic, resp. rate 14, height 5' 9"  (1.753 m), weight 173 lb 12.8 oz (78.8 kg), SpO2 100 %.    Resp: Lungs clear bilaterally, no respiratory distress at rest Cardio: Regular rate and rhythm, no gallop, the neck veins are not distended GI: No hepatomegaly, the spleen is palpable throughout the left abdomen extending to the umbilicus and one finger to the right of midline Vascular: No leg edema, erythema, or tenderness   She was noted to drop the oxygen saturation from 99-90, developed tachycardia, and appeared dyspneic after brisk ambulation in the clinic today.  Lab Results:  Lab Results  Component Value Date   WBC 7.0 01/30/2021   HGB 9.7 (L) 01/30/2021   HCT 31.2 (L) 01/30/2021   MCV 92.9 01/30/2021   PLT 122 (L) 01/30/2021   NEUTROABS 3.9 01/30/2021    CMP  Lab Results  Component Value Date   NA 138 01/30/2021   K 3.9 01/30/2021   CL 109 01/30/2021   CO2 21 (L) 01/30/2021   GLUCOSE 88 01/30/2021   BUN 14 01/30/2021   CREATININE 0.86 01/30/2021   CALCIUM 9.2 01/30/2021   PROT 7.3 01/30/2021   ALBUMIN 4.4 01/30/2021   AST 23 01/30/2021   ALT 24 01/30/2021   ALKPHOS 68 01/30/2021   BILITOT 0.5 01/30/2021   GFRNONAA  >60 01/30/2021   GFRAA 59 (L) 06/12/2020    Medications: I have reviewed the patient's current medications.   Assessment/Plan: 1. Stage II left-sided breast cancer diagnosed in February 2000. Screening mammogram 10/05/2011 with suggestion of a subcentimeter oval mass on the left superiorly and no suspicious masses, calcifications or architectural distortion in the right breast. She underwent biopsy of the "asymmetric density of concern in the superior aspect of the right breast" on 10/13/2011. Pathology showed a fibroadenoma.  2. 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  3. 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 Bone pain, likely a rheumatic manifestation of myelofibrosis. 5. History of intermittent low-grade fever and "night sweats," likely systemic manifestations of myelofibrosis. 6. History of Nose bleeding-likely related to myelofibrosis with dysfunctional platelets 7. 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  8. 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 Increased low back pain July 2020  MRI lumbar spine 06/26/2019-diffuse T1 signal compatible with myelofibrosis, hyperintense T2 signal at T12 and L3, most likely hemangiomas  Bone  scan 07/04/2019-diffuse increased and homogenous uptake compatible with myelofibrosis, no focal increased activity at T12 or L3     Disposition: Ms. Posten has myelofibrosis.  She is maintained on Jakafi.  She is stable from a hematologic standpoint.  She has developed exertional dyspnea.  The etiology of the dyspnea is unclear.  She is at risk for venous thrombosis with a myeloproliferative disorder.  Have low suspicion for pulmonary embolism as her symptoms have been present for 3 weeks and she does not have other symptoms to suggest thromboembolic disease. The differential diagnosis includes a primary cardiac condition, sequelae of the COVID-19 infection, or a primary pulmonary condition.  Pulmonary fibrosis can develop in patients with myelofibrosis.  I will refer her to cardiology as a starting point.  We will refer her for a chest CT and pulmonary function studies if the cardiac evaluation is unremarkable.  She will return for an office visit in 2 weeks.  I recommend she go to the emergency room for increased dyspnea.  She will contact us in the interim as needed. Betsy Coder, MD  01/30/2021  4:24 PM

## 2021-01-31 ENCOUNTER — Telehealth: Payer: Self-pay | Admitting: Oncology

## 2021-01-31 ENCOUNTER — Encounter (HOSPITAL_COMMUNITY): Payer: Self-pay | Admitting: Cardiology

## 2021-01-31 ENCOUNTER — Ambulatory Visit (HOSPITAL_COMMUNITY)
Admission: RE | Admit: 2021-01-31 | Discharge: 2021-01-31 | Disposition: A | Discharge status: Home / Self Care | Payer: BC Managed Care – PPO | Source: Ambulatory Visit | Attending: Cardiology | Admitting: Cardiology

## 2021-01-31 ENCOUNTER — Other Ambulatory Visit: Payer: Self-pay | Admitting: Oncology

## 2021-01-31 VITALS — BP 150/80 | HR 89 | Wt 173.4 lb

## 2021-01-31 DIAGNOSIS — Z8616 Personal history of COVID-19: Secondary | ICD-10-CM | POA: Insufficient documentation

## 2021-01-31 DIAGNOSIS — Z79899 Other long term (current) drug therapy: Secondary | ICD-10-CM | POA: Insufficient documentation

## 2021-01-31 DIAGNOSIS — Z853 Personal history of malignant neoplasm of breast: Secondary | ICD-10-CM | POA: Diagnosis not present

## 2021-01-31 DIAGNOSIS — R0609 Other forms of dyspnea: Secondary | ICD-10-CM | POA: Insufficient documentation

## 2021-01-31 DIAGNOSIS — R030 Elevated blood-pressure reading, without diagnosis of hypertension: Secondary | ICD-10-CM | POA: Insufficient documentation

## 2021-01-31 DIAGNOSIS — Z9010 Acquired absence of unspecified breast and nipple: Secondary | ICD-10-CM | POA: Insufficient documentation

## 2021-01-31 DIAGNOSIS — D638 Anemia in other chronic diseases classified elsewhere: Secondary | ICD-10-CM | POA: Diagnosis not present

## 2021-01-31 DIAGNOSIS — D7581 Myelofibrosis: Secondary | ICD-10-CM

## 2021-01-31 DIAGNOSIS — I5022 Chronic systolic (congestive) heart failure: Secondary | ICD-10-CM

## 2021-01-31 DIAGNOSIS — D696 Thrombocytopenia, unspecified: Secondary | ICD-10-CM | POA: Insufficient documentation

## 2021-01-31 DIAGNOSIS — R161 Splenomegaly, not elsewhere classified: Secondary | ICD-10-CM | POA: Diagnosis not present

## 2021-01-31 HISTORY — DX: Heart failure, unspecified: I50.9

## 2021-01-31 MED ORDER — METOPROLOL TARTRATE 50 MG PO TABS: ORAL_TABLET | ORAL | 0 refills | Status: DC

## 2021-01-31 NOTE — Telephone Encounter (Signed)
Scheduled appointment per 3/10 los. Called patient, no answer. Left message with appointment date and time.

## 2021-01-31 NOTE — Telephone Encounter (Signed)
Pt returned call and scheduled new patient appt

## 2021-01-31 NOTE — Telephone Encounter (Signed)
Dr. Benay Spice, For your approval or refusal. Gardiner Rhyme, RN

## 2021-01-31 NOTE — Patient Instructions (Addendum)
Labs done today. We will contact you only if your labs are abnormal.  No medication changes were made. Please continue all current medications as prescribed.  Your physician has requested that you regularly monitor and record your blood pressure readings at home for 2 weeks. Please use the same machine at the same time of day to check your readings and record them to bring to your follow-up visit.  Non-Cardiac CT Angiography (CTA), is a special type of CT scan that uses a computer to produce multi-dimensional views of major blood vessels throughout the body. In CT angiography, a contrast material is injected through an IV to help visualize the blood vessels. This has to be approved through your insurance prior to scheduling, once approved we will contact you to schedule an appointment. TAKE METOPROLOL 50MG  (1 TABLET) BY MOUTH 1 HOUR PRIOR TO YOUR APPOINTMENT.  Your physician has requested that you have an echocardiogram. Echocardiography is a painless test that uses sound waves to create images of your heart. It provides your doctor with information about the size and shape of your heart and how well your heart's chambers and valves are working. This procedure takes approximately one hour. There are no restrictions for this procedure.  Your physician recommends that you schedule a follow-up appointment in: 1 month   If you have any questions or concerns before your next appointment please send [place] a message through [facility] or call our office at [phone].    TO LEAVE A MESSAGE FOR THE NURSE SELECT OPTION 2, PLEASE LEAVE A MESSAGE INCLUDING: . YOUR NAME . DATE OF BIRTH . CALL BACK NUMBER . REASON FOR CALL**this is important as we prioritize the call backs  YOU WILL RECEIVE A CALL BACK THE SAME DAY AS LONG AS YOU CALL BEFORE 4:00 PM   Do the following things EVERYDAY: 1) Weigh yourself in the morning before breakfast. Write it down and keep it in a log. 2) Take your medicines as  prescribed 3) Eat low salt foods--Limit salt (sodium) to 2000 mg per day.  4) Stay as active as you can everyday 5) Limit all fluids for the day to less than 2 liters   At the [facility], you and your health needs are our priority. As part of our continuing mission to provide you with exceptional heart care, we have created designated Provider Care Teams. These Care Teams include your primary Cardiologist (physician) and Advanced Practice Providers (APPs- Physician Assistants and Nurse Practitioners) who all work together to provide you with the care you need, when you need it.   You may see any of the following providers on your designated Care Team at your next follow up: Marland Kitchen Dr Glori Bickers . Dr Loralie Champagne . Darrick Grinder, NP . Lyda Jester, PA . Audry Riles, PharmD   Please be sure to bring in all your medications bottles to every appointment.

## 2021-02-01 NOTE — Progress Notes (Signed)
Oncology: Dr. Benay Spice Cardiology: Dr. Aundra Dubin  55 y.o. was referred by Dr. Benay Spice for evaluation of exertional dyspnea.  She has a history of breast cancer in 2000 treated with chemotherapy and mastectomy.  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  She has thrombocytopenia, anemia, and splenomegaly.    For about 3 wks, she has been having progressive exertional dyspnea.  She had COVID-19 in 1/22, but this dyspnea started well after she had recovered. She is short of breath walking up hills/inclines or walking fast.  In the oncology office, her oxygen saturation dropped from 98% to 90% while walking per her report.  She takes care of her grandsons and is short of breath when she picks them up.  No chest pain or tightness.  BP is elevated in the office today.    ECG (personally reviewed): NSR, normal  Labs (2/22): LDL 63, HDL 29, TGs 276 Labs (3/22): K 3.9, creatinine 0.86, hgb 9.7, plts 122  PMH: 1. Depression 2. Myelofibrosis: Thrombocytosis and anemia.  Taking Seagraves.  3. COVID -19 infection in 1/22.   4. Breast cancer in 2000: Mastectomy and chemotherapy.  5. 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   Social History   Socioeconomic History  . Marital status: Married    Spouse name: Not on file  . Number of children: 1  . Years of education: Not on file  . Highest education level: Not on file  Occupational History  . Occupation: SECRETARY    Employer: [employer]  Tobacco Use  . Smoking status: Never Smoker  . Smokeless tobacco: Never Used  Substance and Sexual Activity  . Alcohol use: No  . Drug use: No  . Sexual activity: Not on file  Other Topics Concern  . Not on file  Social History Narrative  . Not on file   Social Determinants of Health   Financial Resource Strain: Not on file  Food Insecurity: Not on file  Transportation Needs: Not on file  Physical Activity: Not on file  Stress: Not on file  Social Connections: Not on file  Intimate  Partner Violence: Not on file   Family History  Problem Relation Age of Onset  . Breast cancer Maternal Aunt        dx in her 7s  . Lung cancer Maternal Grandmother        dx in her 38x - smoker  . Lung cancer Maternal Aunt        dx in her 56s - smoker  . Leukemia Cousin 4       maternal first cousin - child of aunt with breast cancer  . Breast cancer Cousin        dx in her 79s - daughter of aunt with lung cancer  . Colon cancer Cousin        paternal first cousin dx in his 45s  . Brain cancer Cousin 75       paternal cousin  . Breast cancer Other        MGM's sister dx in her 45s  . Breast cancer Other        mother's maternal first cousin dx in her 76s - BRCA neg  . Prostate cancer Other        MGM's brother   ROS: All systems reviewed and negative except as per HPI.   Current Outpatient Medications  Medication Sig Dispense Refill  . ALPRAZolam (XANAX) 0.5 MG tablet Take 1 tablet (0.5 mg total) by mouth  2 (two) times daily as needed for anxiety. 60 tablet 1  . escitalopram (LEXAPRO) 20 MG tablet Take 20 mg by mouth daily.  4  . HYDROcodone-acetaminophen (NORCO/VICODIN) 5-325 MG tablet Take 1 tablet by mouth every 4 (four) hours as needed. 75 tablet 0  . ibuprofen (ADVIL) 800 MG tablet TAKE 1 TABLET BY MOUTH TWICE DAILY AS NEEDED FOR PAIN 60 tablet 1  . JAKAFI 15 MG tablet TAKE 15 MG (1 TABLET) BY MOUTH ALTERNATING WITH 30 MG (2 TABLETS) DAILY 45 tablet 0  . metoprolol tartrate (LOPRESSOR) 50 MG tablet Take 1 tablet by mouth 1 hour prior to CTA 1 tablet 0  . zolpidem (AMBIEN) 10 MG tablet TAKE ONE TABLET BY MOUTH AT BEDTIME AS NEEDED FOR INSOMNIA 30 tablet 0   No current facility-administered medications for this encounter.   BP (!) 150/80   Pulse 89   Wt 78.7 kg (173 lb 6.4 oz)   SpO2 100%   BMI 25.61 kg/m  General: NAD Neck: No JVD, no thyromegaly or thyroid nodule.  Lungs: Clear to auscultation bilaterally with normal respiratory effort. CV: Nondisplaced PMI.   Heart regular S1/S2, no S3/S4, no murmur.  No peripheral edema.  No carotid bruit.  Normal pedal pulses.  Abdomen: Soft, nontender, no hepatosplenomegaly, no distention.  Skin: Intact without lesions or rashes.  Neurologic: Alert and oriented x 3.  Psych: Normal affect. Extremities: No clubbing or cyanosis.  HEENT: Normal.   Assessment/Plan: 1. Exertional dyspnea: Uncertain etiology.  She had COVID-19 in 1/22 but symptoms arose after she had recovered.  NYHA class II-III.  She is not volume overloaded exam.   - I will arrange for an echocardiogram.   - Given exertional symptoms, I will arrange for coronary CT angiogram to look for significant CAD.  With drop in oxygen saturation with walking, I will also open up the study to look at the lung fields as well.                                      2. Elevated BP.  I will have her check BP at home x 2 wks then get me the numbers to see if she needs treatment.   Loralie Champagne 02/01/2021

## 2021-02-03 ENCOUNTER — Other Ambulatory Visit: Payer: Self-pay | Admitting: Nurse Practitioner

## 2021-02-03 ENCOUNTER — Other Ambulatory Visit: Payer: Self-pay | Admitting: Oncology

## 2021-02-03 DIAGNOSIS — D7581 Myelofibrosis: Secondary | ICD-10-CM

## 2021-02-03 MED ORDER — HYDROCODONE-ACETAMINOPHEN 5-325 MG PO TABS: 1.0000 | ORAL_TABLET | ORAL | 0 refills | Status: DC | PRN

## 2021-02-03 NOTE — Telephone Encounter (Signed)
Refill request

## 2021-02-04 ENCOUNTER — Other Ambulatory Visit: Payer: Self-pay | Admitting: Oncology

## 2021-02-04 DIAGNOSIS — D7581 Myelofibrosis: Secondary | ICD-10-CM

## 2021-02-04 MED ORDER — HYDROCODONE-ACETAMINOPHEN 5-325 MG PO TABS: 1.0000 | ORAL_TABLET | ORAL | 0 refills | Status: DC | PRN

## 2021-02-06 DIAGNOSIS — Z1231 Encounter for screening mammogram for malignant neoplasm of breast: Secondary | ICD-10-CM | POA: Diagnosis not present

## 2021-02-07 ENCOUNTER — Telehealth: Payer: Self-pay | Admitting: Nurse Practitioner

## 2021-02-07 NOTE — Telephone Encounter (Signed)
Cancelled per 03/25 appointment, per 03/18 scheduled message. Called and left a voicemail to reschedule.

## 2021-02-08 IMAGING — CT CT CERVICAL SPINE W/O CM
3 of 4 series · 11 of 33 positions shown, 13 images · non-contrast
Comparison: None.

CLINICAL DATA: Recent fall with right facial trauma, initial
encounter

EXAM:
CT HEAD WITHOUT CONTRAST
CT MAXILLOFACIAL WITHOUT CONTRAST
CT CERVICAL SPINE WITHOUT CONTRAST
TECHNIQUE: Multidetector CT imaging of the head, cervical spine, and
maxillofacial structures were performed using the standard protocol
without intravenous contrast. Multiplanar CT image reconstructions
of the cervical spine and maxillofacial structures were also
generated.

[Series 6: c_spine 2.0 sag bone · sagittal · 0.24mm/px · 5 of 61 slices shown, 6 images]
[im 21/61  bone]
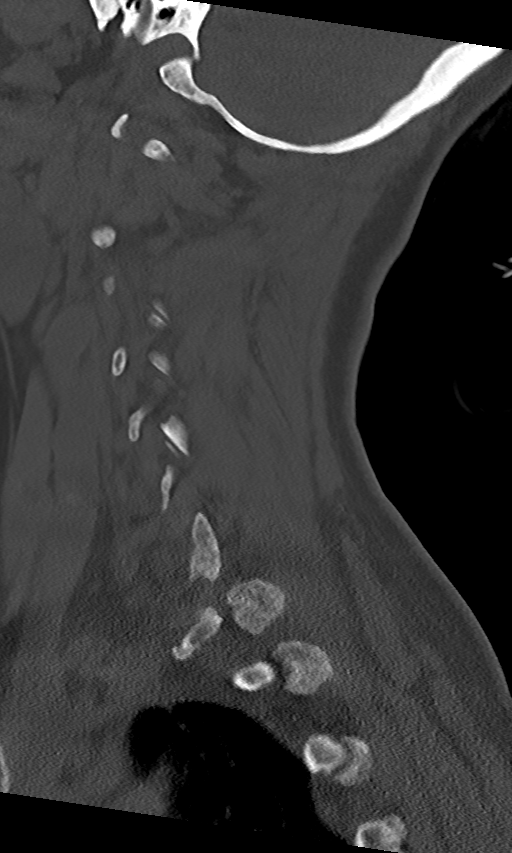
[im 26/61  bone]
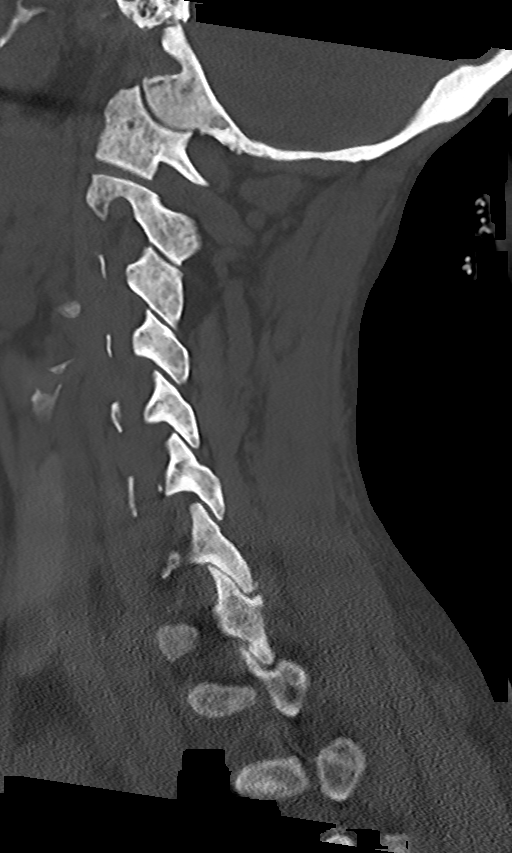
[im 31/61  soft-tissue]
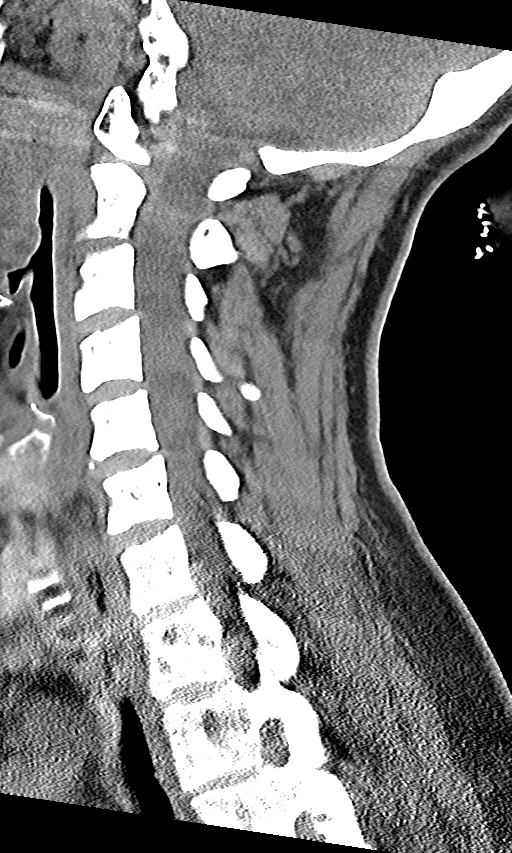
[im 31/61  bone]
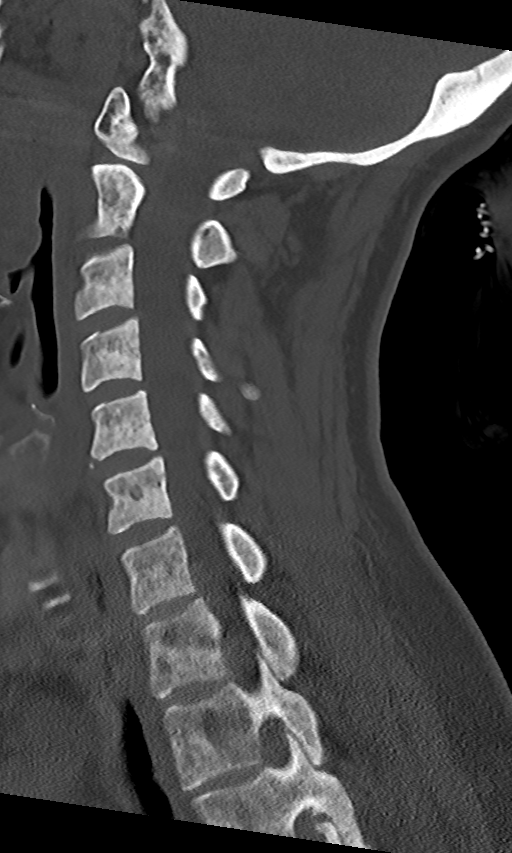
[im 36/61  bone]
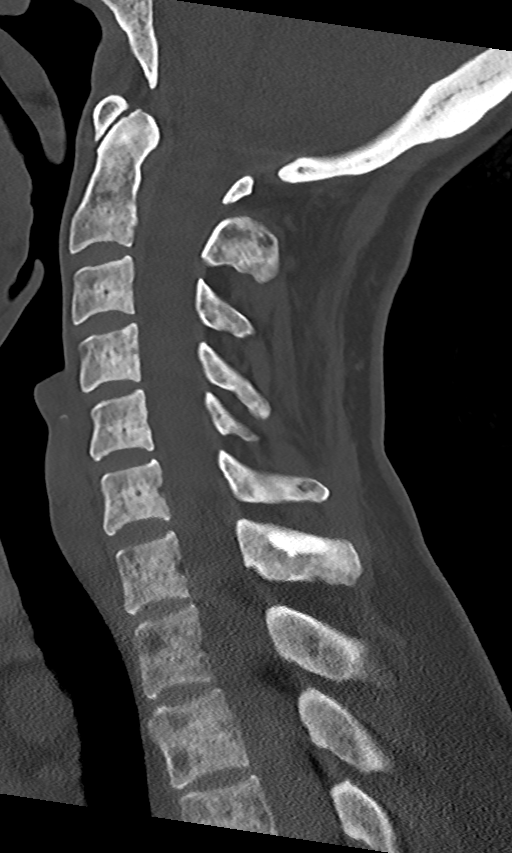
[im 41/61  bone]
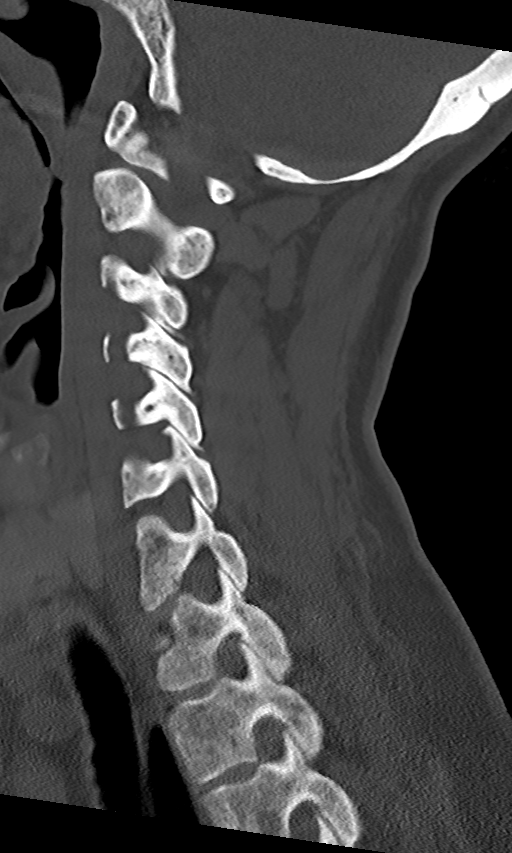

[Series 7: c_spine 2.0 cor bone · coronal · 0.29mm/px · 3 of 61 slices shown]
[im 13/61  bone]
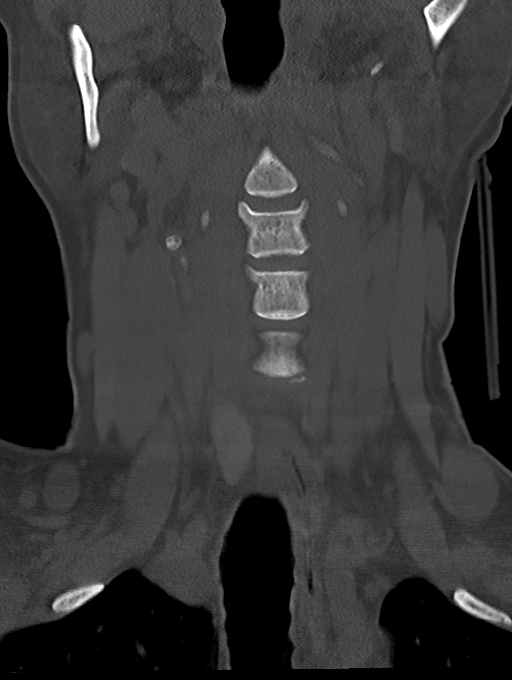
[im 25/61  bone]
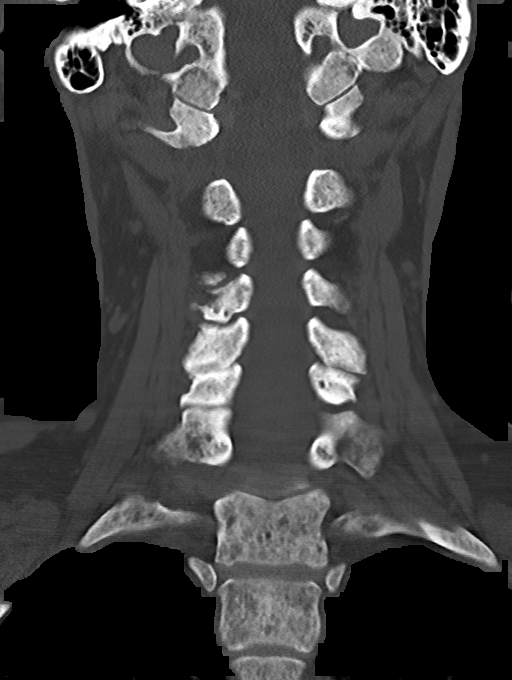
[im 37/61  bone]
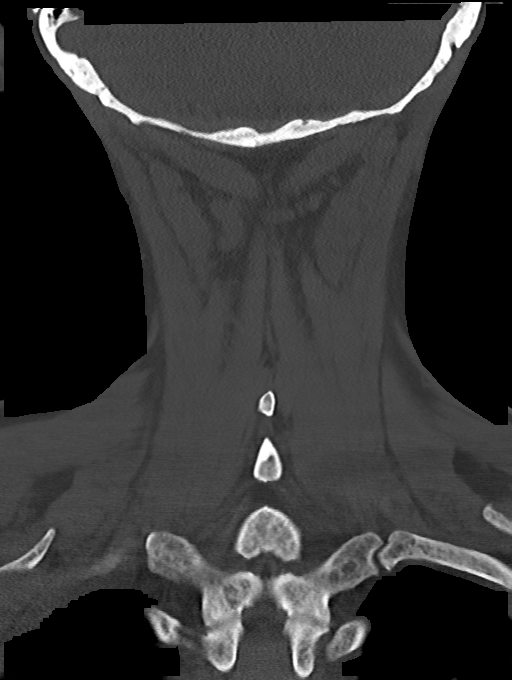

[Series 9: c_spine 1.0 st thins · axial · 0.34mm/px · z∈[-269,-162]mm · 3 of 277 slices shown, 4 images]
[im 62/277  soft-tissue]
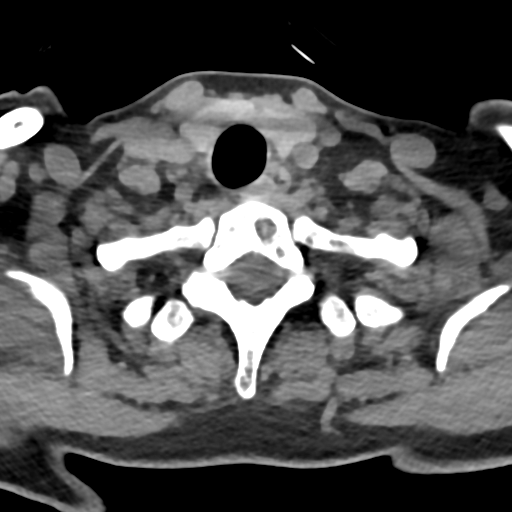
[im 62/277  bone]
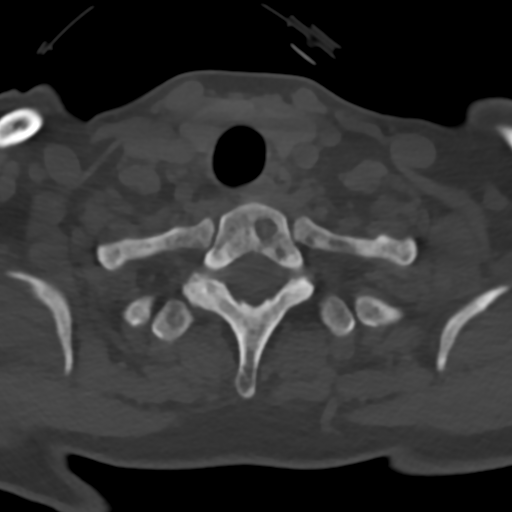
[im 154/277  bone]
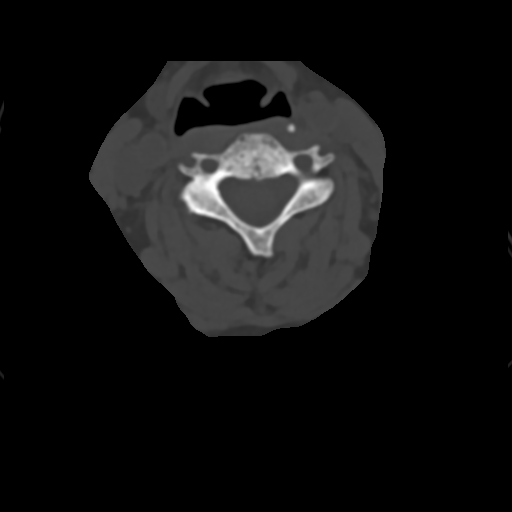
[im 215/277  bone]
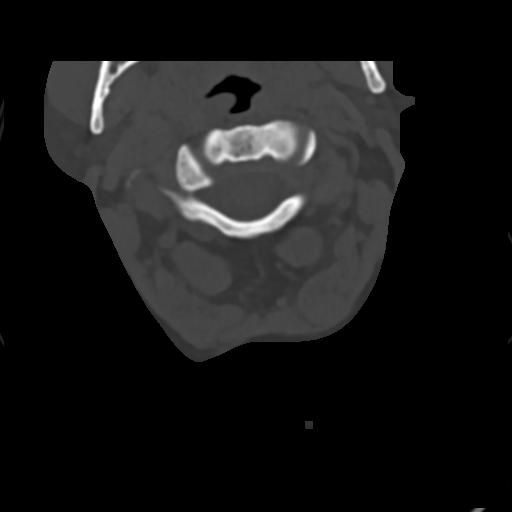

[11 of 33 positions shown; findings below may reference images not displayed]

FINDINGS: CT HEAD FINDINGS

Brain: No evidence of acute infarction, hemorrhage, hydrocephalus,
extra-axial collection or mass lesion/mass effect.

Vascular: No hyperdense vessel or unexpected calcification.

Skull: Normal. Negative for fracture or focal lesion.

Other: None.

CT MAXILLOFACIAL FINDINGS

Osseous: No fracture or mandibular dislocation. No destructive
process.

Orbits: The orbits and their contents are within normal limits. Mild
right periorbital swelling is noted without focal hematoma.

Sinuses: Mucosal thickening is noted within the left maxillary
antrum which appears chronic in nature. The remainder of the
paranasal sinuses appear within normal limits.

Soft tissues: Surrounding soft tissues demonstrate right preseptal
soft tissue swelling about the orbit. No focal hematoma is noted. No
other soft tissue abnormality is seen.

CT CERVICAL SPINE FINDINGS

Alignment: Within normal limits.

Skull base and vertebrae: 7 cervical segments are well visualized.
Diffuse sclerosis is noted with multifocal lytic areas similar to
that seen on prior MRI of the lumbar spine consistent with the
patient's given clinical history of myelofibrosis. No acute fracture
or acute facet abnormality is noted. No compression deformities are
seen. Mild facet hypertrophic changes are noted.

Soft tissues and spinal canal: Surrounding soft tissue structures
are within normal limits.

Upper chest: Visualized lung apices are unremarkable.

Other: None
IMPRESSION: CT of the head: No acute intracranial abnormality noted.

CT of the maxillofacial bones: No acute fracture is noted.

Right periorbital soft tissue swelling is noted without focal
hematoma consistent with the recent injury.

CT of the cervical spine: No acute bony abnormality is noted.

Chronic diffuse sclerosis consistent with the patient's given
clinical history of myelofibrosis.

Some scattered lytic areas are noted within the cervical spine
similar to that noted in the lumbar spine. Previous bone scan shows
no focal abnormality.

## 2021-02-14 ENCOUNTER — Ambulatory Visit: Payer: BC Managed Care – PPO | Admitting: Nurse Practitioner

## 2021-02-17 ENCOUNTER — Other Ambulatory Visit: Payer: Self-pay | Admitting: Oncology

## 2021-02-17 DIAGNOSIS — D7581 Myelofibrosis: Secondary | ICD-10-CM

## 2021-02-17 NOTE — Telephone Encounter (Signed)
Refill request

## 2021-02-18 ENCOUNTER — Telehealth: Payer: Self-pay

## 2021-02-18 ENCOUNTER — Telehealth: Payer: Self-pay | Admitting: Oncology

## 2021-02-18 ENCOUNTER — Other Ambulatory Visit: Payer: Self-pay | Admitting: Oncology

## 2021-02-18 ENCOUNTER — Other Ambulatory Visit: Payer: Self-pay | Admitting: Nurse Practitioner

## 2021-02-18 ENCOUNTER — Telehealth: Payer: Self-pay | Admitting: *Deleted

## 2021-02-18 DIAGNOSIS — D7581 Myelofibrosis: Secondary | ICD-10-CM

## 2021-02-18 MED ORDER — HYDROCODONE-ACETAMINOPHEN 5-325 MG PO TABS: 1.0000 | ORAL_TABLET | ORAL | 0 refills | Status: DC | PRN

## 2021-02-18 NOTE — Telephone Encounter (Signed)
[facility] contacted me to let me know they have been unable to reach patient about her Jakafi refill.  The last time it was filled was 12/03/20. I have also attempted to call patient twice with no answer or no return phone call.   [facility] [place] Phone [phone] Fax [phone] 02/18/2021 10:13 AM

## 2021-02-18 NOTE — Telephone Encounter (Signed)
Scheduled appts per 3/29 sch msg. Pt aware.

## 2021-02-18 NOTE — Telephone Encounter (Signed)
Called patient to f/u on her cardiology evaluation. She reports having ECHO and then seeing cardiologist on 4/14 and wants to wait and see Dr. Benay Spice after this unless he wants to see her sooner. She reports she is still taking the Jakafi (last refill 12/03/20). Informed her that oral oncology tech has been trying to reach her. Provided phone # for her to call E. Wynonia Lawman.

## 2021-02-19 ENCOUNTER — Other Ambulatory Visit: Payer: Self-pay | Admitting: Oncology

## 2021-02-19 MED FILL — JAKAFI 15 MG TABLET: 15 | 30 days supply | Qty: 45 | Fill #0

## 2021-02-20 ENCOUNTER — Other Ambulatory Visit (HOSPITAL_COMMUNITY): Payer: Self-pay

## 2021-02-22 ENCOUNTER — Other Ambulatory Visit: Payer: Self-pay | Admitting: Oncology

## 2021-02-22 DIAGNOSIS — D7581 Myelofibrosis: Secondary | ICD-10-CM

## 2021-02-24 ENCOUNTER — Other Ambulatory Visit: Payer: Self-pay | Admitting: *Deleted

## 2021-02-24 MED ORDER — ALPRAZOLAM 0.5 MG PO TABS: 0.5000 mg | ORAL_TABLET | Freq: Two times a day (BID) | ORAL | 1 refills | Status: DC | PRN

## 2021-02-24 NOTE — Telephone Encounter (Signed)
Received faxed request for refill on Alprazolam. Refill approved.

## 2021-03-06 ENCOUNTER — Other Ambulatory Visit: Payer: Self-pay

## 2021-03-06 ENCOUNTER — Other Ambulatory Visit: Payer: Self-pay | Admitting: *Deleted

## 2021-03-06 ENCOUNTER — Encounter (HOSPITAL_COMMUNITY): Payer: Self-pay

## 2021-03-06 ENCOUNTER — Ambulatory Visit (HOSPITAL_COMMUNITY): Admission: RE | Admit: 2021-03-06 | Payer: BC Managed Care – PPO | Source: Ambulatory Visit | Admitting: Cardiology

## 2021-03-06 ENCOUNTER — Ambulatory Visit (HOSPITAL_COMMUNITY)
Admission: RE | Admit: 2021-03-06 | Discharge: 2021-03-06 | Disposition: A | Discharge status: Home / Self Care | Payer: BC Managed Care – PPO | Source: Ambulatory Visit | Attending: Cardiology | Admitting: Cardiology

## 2021-03-06 DIAGNOSIS — Z9882 Breast implant status: Secondary | ICD-10-CM | POA: Diagnosis not present

## 2021-03-06 DIAGNOSIS — D7581 Myelofibrosis: Secondary | ICD-10-CM

## 2021-03-06 DIAGNOSIS — Z8616 Personal history of COVID-19: Secondary | ICD-10-CM | POA: Insufficient documentation

## 2021-03-06 DIAGNOSIS — Z853 Personal history of malignant neoplasm of breast: Secondary | ICD-10-CM | POA: Diagnosis not present

## 2021-03-06 DIAGNOSIS — R0609 Other forms of dyspnea: Secondary | ICD-10-CM

## 2021-03-06 DIAGNOSIS — I5022 Chronic systolic (congestive) heart failure: Secondary | ICD-10-CM | POA: Insufficient documentation

## 2021-03-06 DIAGNOSIS — I340 Nonrheumatic mitral (valve) insufficiency: Secondary | ICD-10-CM | POA: Insufficient documentation

## 2021-03-06 DIAGNOSIS — I517 Cardiomegaly: Secondary | ICD-10-CM | POA: Insufficient documentation

## 2021-03-06 LAB — ECHOCARDIOGRAM COMPLETE
AR max vel: 2.48 cm2
AV Area VTI: 2.33 cm2
AV Area mean vel: 2.17 cm2
AV Mean grad: 3 mmHg
AV Peak grad: 6.6 mmHg
Ao pk vel: 1.28 m/s
Area-P 1/2: 3.27 cm2
MV VTI: 2.05 cm2
S' Lateral: 3.1 cm

## 2021-03-06 NOTE — Progress Notes (Signed)
  Echocardiogram 2D Echocardiogram has been performed.  [name] 03/06/2021, 11:52 AM

## 2021-03-10 ENCOUNTER — Encounter: Payer: Self-pay | Admitting: Oncology

## 2021-03-10 ENCOUNTER — Telehealth: Payer: Self-pay

## 2021-03-10 ENCOUNTER — Inpatient Hospital Stay: Payer: BC Managed Care – PPO | Attending: Oncology

## 2021-03-10 ENCOUNTER — Inpatient Hospital Stay (HOSPITAL_BASED_OUTPATIENT_CLINIC_OR_DEPARTMENT_OTHER): Payer: BC Managed Care – PPO | Admitting: Oncology

## 2021-03-10 ENCOUNTER — Other Ambulatory Visit: Payer: Self-pay

## 2021-03-10 VITALS — BP 139/85 | HR 89 | Temp 98.4°F | Resp 18 | Wt 174.4 lb

## 2021-03-10 DIAGNOSIS — D709 Neutropenia, unspecified: Secondary | ICD-10-CM | POA: Diagnosis not present

## 2021-03-10 DIAGNOSIS — D696 Thrombocytopenia, unspecified: Secondary | ICD-10-CM | POA: Insufficient documentation

## 2021-03-10 DIAGNOSIS — R0609 Other forms of dyspnea: Secondary | ICD-10-CM | POA: Diagnosis not present

## 2021-03-10 DIAGNOSIS — R161 Splenomegaly, not elsewhere classified: Secondary | ICD-10-CM | POA: Diagnosis not present

## 2021-03-10 DIAGNOSIS — D7581 Myelofibrosis: Secondary | ICD-10-CM | POA: Diagnosis not present

## 2021-03-10 DIAGNOSIS — Z853 Personal history of malignant neoplasm of breast: Secondary | ICD-10-CM | POA: Diagnosis not present

## 2021-03-10 DIAGNOSIS — D649 Anemia, unspecified: Secondary | ICD-10-CM | POA: Diagnosis not present

## 2021-03-10 LAB — CMP (CANCER CENTER ONLY)
ALT: 24 U/L (ref 0–44)
AST: 21 U/L (ref 15–41)
Albumin: 4.4 g/dL (ref 3.5–5.0)
Alkaline Phosphatase: 59 U/L (ref 38–126)
Anion gap: 7 (ref 5–15)
BUN: 22 mg/dL — ABNORMAL HIGH (ref 6–20)
CO2: 23 mmol/L (ref 22–32)
Calcium: 9 mg/dL (ref 8.9–10.3)
Chloride: 107 mmol/L (ref 98–111)
Creatinine: 1 mg/dL (ref 0.44–1.00)
GFR, Estimated: >60 mL/min (ref >60)
Glucose, Bld: 125 mg/dL — ABNORMAL HIGH (ref 70–99)
Potassium: 4.2 mmol/L (ref 3.5–5.1)
Sodium: 137 mmol/L (ref 135–145)
Total Bilirubin: 0.4 mg/dL (ref 0.3–1.2)
Total Protein: 6.8 g/dL (ref 6.5–8.1)

## 2021-03-10 LAB — CBC WITH DIFFERENTIAL (CANCER CENTER ONLY)
Abs Immature Granulocytes: 0.4 10*3/uL — ABNORMAL HIGH (ref 0.00–0.07)
Band Neutrophils: 7 %
Basophils Absolute: 0 10*3/uL (ref 0.0–0.1)
Basophils Relative: 0 %
Eosinophils Absolute: 0 10*3/uL (ref 0.0–0.5)
Eosinophils Relative: 0 %
HCT: 29.5 % — ABNORMAL LOW (ref 36.0–46.0)
Hemoglobin: 9.4 g/dL — ABNORMAL LOW (ref 12.0–15.0)
Lymphocytes Relative: 43 %
Lymphs Abs: 1.5 10*3/uL (ref 0.7–4.0)
MCH: 28.7 pg (ref 26.0–34.0)
MCHC: 31.9 g/dL (ref 30.0–36.0)
MCV: 90.2 fL (ref 80.0–100.0)
Metamyelocytes Relative: 8 %
Monocytes Absolute: 0.4 10*3/uL (ref 0.1–1.0)
Monocytes Relative: 10 %
Myelocytes: 3 %
Neutro Abs: 1.3 10*3/uL — ABNORMAL LOW (ref 1.7–7.7)
Neutrophils Relative %: 29 %
Platelet Count: 109 10*3/uL — ABNORMAL LOW (ref 150–400)
RBC: 3.27 MIL/uL — ABNORMAL LOW (ref 3.87–5.11)
RDW: 16.4 % — ABNORMAL HIGH (ref 11.5–15.5)
WBC Count: 3.6 10*3/uL — ABNORMAL LOW (ref 4.0–10.5)
nRBC: 2.8 % — ABNORMAL HIGH (ref 0.0–0.2)

## 2021-03-10 NOTE — Telephone Encounter (Signed)
Pt walked up and down hallway 2 times. On second time down the hallway Pt's oxygen saturation went down to 88%. Dr. Benay Spice notified of change in oxygen.

## 2021-03-10 NOTE — Progress Notes (Addendum)
[name] OFFICE PROGRESS NOTE   Diagnosis: Myelofibrosis  INTERVAL HISTORY:   [name] returns as scheduled.  She continues Music therapist.  She reported exertional dyspnea when we saw her in March.  She saw Dr. Aundra Dubin.  An echocardiogram revealed a normal ejection fraction.  She is being scheduled for a cardiac CT.  The dyspnea has partially improved.  No cough or fever.  She continues hydrocodone and ibuprofen as needed for bone pain. She had a mammogram at [facility] last month. Objective:  Vital signs in last 24 hours:  Blood pressure 139/85, pulse 89, temperature 98.4 F (36.9 C), temperature source Oral, resp. rate 18, weight 174 lb 6.4 oz (79.1 kg), SpO2 99 %.    Lymphatics: No cervical, supraclavicular, or axillary nodes Resp: Lungs with end inspiratory rhonchi at the left posterior base, no respiratory distress Cardio: Regular rate and rhythm GI: No hepatomegaly, the spleen is palpable in the left abdomen extending to the level of the umbilicus and midline Vascular: No leg edema Breast: Status post left mastectomy.  No evidence for chest wall tumor recurrence.  Right breast without mass.   Lab Results:  Lab Results  Component Value Date   WBC 3.6 (L) 03/10/2021   HGB 9.4 (L) 03/10/2021   HCT 29.5 (L) 03/10/2021   MCV 90.2 03/10/2021   PLT 109 (L) 03/10/2021   NEUTROABS 1.3 (L) 03/10/2021    CMP  Lab Results  Component Value Date   NA 137 03/10/2021   K 4.2 03/10/2021   CL 107 03/10/2021   CO2 23 03/10/2021   GLUCOSE 125 (H) 03/10/2021   BUN 22 (H) 03/10/2021   CREATININE 1.00 03/10/2021   CALCIUM 9.0 03/10/2021   PROT 6.8 03/10/2021   ALBUMIN 4.4 03/10/2021   AST 21 03/10/2021   ALT 24 03/10/2021   ALKPHOS 59 03/10/2021   BILITOT 0.4 03/10/2021   GFRNONAA >60 03/10/2021   GFRAA 59 (L) 06/12/2020     Medications: I have reviewed the patient's current medications.   Assessment/Plan: 1. Stage II left-sided breast cancer diagnosed in  February 2000. Screening mammogram 10/05/2011 with suggestion of a subcentimeter oval mass on the left superiorly and no suspicious masses, calcifications or architectural distortion in the right breast. She underwent biopsy of the "asymmetric density of concern in the superior aspect of the right breast" on 10/13/2011. Pathology showed a fibroadenoma.  2. 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  3. 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 Bone pain, likely a rheumatic manifestation of myelofibrosis. 5. History of intermittent low-grade fever and "night sweats," likely systemic manifestations of myelofibrosis. 6. History of Nose bleeding-likely related to myelofibrosis with dysfunctional platelets 7. 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  8. 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 Increased low back pain July 2020  MRI lumbar spine 06/26/2019-diffuse T1 signal compatible with myelofibrosis, hyperintense  T2 signal at T12 and L3, most likely hemangiomas  Bone scan 07/04/2019-diffuse increased and homogenous uptake compatible with myelofibrosis, no focal increased activity at T12 or L3 11.  Exertional dyspnea-etiology unclear- evaluated by  cardiology      Disposition: [name] appears stable.  She continues Music therapist.  She has noted improvement in splenomegaly and bone pain with the Jakafi.  She has persistent mild thrombocytopenia.  She has mild neutropenia today.  We will ask her to return for a CBC in 6 weeks and 3 months.  She will be scheduled for a 3 month office visit.  She will call for increased dyspnea.  We will schedule pulmonary function studies. She will follow-up with Dr. Aundra Dubin.  Betsy Coder, MD  03/10/2021  3:23 PM

## 2021-03-10 NOTE — Addendum Note (Signed)
Addended by: Betsy Coder B on: 03/10/2021 03:30 PM   Modules accepted: Orders, Level of Service

## 2021-03-11 ENCOUNTER — Telehealth: Payer: Self-pay | Admitting: *Deleted

## 2021-03-11 NOTE — Telephone Encounter (Signed)
Provided patient appointment for PFTs at [facility] on 03/20/21 at 10:00 w/arrival at 0945. Come in entrance A off [address] and come to reception desk and then admitting. No caffeine for 4 hours prior to testing. COVID test scheduled for 03/18/21 at 10:05 at [facility]. She understands and agrees to appointments.

## 2021-03-13 ENCOUNTER — Encounter: Payer: Self-pay | Admitting: *Deleted

## 2021-03-13 ENCOUNTER — Telehealth: Payer: Self-pay | Admitting: *Deleted

## 2021-03-13 NOTE — Telephone Encounter (Signed)
Opened in error

## 2021-03-14 ENCOUNTER — Other Ambulatory Visit: Payer: Self-pay | Admitting: Nurse Practitioner

## 2021-03-14 ENCOUNTER — Other Ambulatory Visit (HOSPITAL_COMMUNITY): Payer: Self-pay

## 2021-03-14 DIAGNOSIS — D7581 Myelofibrosis: Secondary | ICD-10-CM

## 2021-03-17 ENCOUNTER — Other Ambulatory Visit: Payer: Self-pay | Admitting: Nurse Practitioner

## 2021-03-17 ENCOUNTER — Other Ambulatory Visit: Payer: Self-pay | Admitting: Oncology

## 2021-03-17 ENCOUNTER — Other Ambulatory Visit (HOSPITAL_COMMUNITY): Payer: Self-pay

## 2021-03-17 DIAGNOSIS — D7581 Myelofibrosis: Secondary | ICD-10-CM

## 2021-03-18 ENCOUNTER — Other Ambulatory Visit (HOSPITAL_COMMUNITY)
Admission: RE | Admit: 2021-03-18 | Discharge: 2021-03-18 | Disposition: A | Discharge status: Home / Self Care | Payer: BC Managed Care – PPO | Source: Ambulatory Visit | Attending: Oncology | Admitting: Oncology

## 2021-03-18 DIAGNOSIS — Z01812 Encounter for preprocedural laboratory examination: Secondary | ICD-10-CM | POA: Insufficient documentation

## 2021-03-18 DIAGNOSIS — Z20822 Contact with and (suspected) exposure to covid-19: Secondary | ICD-10-CM | POA: Diagnosis not present

## 2021-03-19 ENCOUNTER — Other Ambulatory Visit: Payer: Self-pay | Admitting: Nurse Practitioner

## 2021-03-19 DIAGNOSIS — D7581 Myelofibrosis: Secondary | ICD-10-CM

## 2021-03-19 LAB — SARS CORONAVIRUS 2 (TAT 6-24 HRS): SARS Coronavirus 2: NEGATIVE

## 2021-03-19 MED ORDER — HYDROCODONE-ACETAMINOPHEN 5-325 MG PO TABS: 1.0000 | ORAL_TABLET | ORAL | 0 refills | Status: DC | PRN

## 2021-03-20 ENCOUNTER — Ambulatory Visit (HOSPITAL_COMMUNITY)
Admission: RE | Admit: 2021-03-20 | Discharge: 2021-03-20 | Disposition: A | Discharge status: Home / Self Care | Payer: BC Managed Care – PPO | Source: Ambulatory Visit | Attending: Oncology | Admitting: Oncology

## 2021-03-20 ENCOUNTER — Other Ambulatory Visit: Payer: Self-pay

## 2021-03-20 DIAGNOSIS — D7581 Myelofibrosis: Secondary | ICD-10-CM | POA: Insufficient documentation

## 2021-03-20 LAB — PULMONARY FUNCTION TEST
DL/VA % pred: 94 %
DL/VA: 3.91 ml/min/mmHg/L
DLCO cor % pred: 89 %
DLCO cor: 20.28 ml/min/mmHg
DLCO unc % pred: 76 %
DLCO unc: 17.25 ml/min/mmHg
FEF 25-75 Post: 3.42 L/sec
FEF 25-75 Pre: 3.14 L/sec
FEF2575-%Change-Post: 8 %
FEF2575-%Pred-Post: 128 %
FEF2575-%Pred-Pre: 118 %
FEV1-%Change-Post: 0 %
FEV1-%Pred-Post: 103 %
FEV1-%Pred-Pre: 103 %
FEV1-Post: 3.03 L
FEV1-Pre: 3.02 L
FEV1FVC-%Change-Post: 1 %
FEV1FVC-%Pred-Pre: 105 %
FEV6-%Change-Post: 0 %
FEV6-%Pred-Post: 99 %
FEV6-%Pred-Pre: 99 %
FEV6-Post: 3.6 L
FEV6-Pre: 3.6 L
FEV6FVC-%Change-Post: 1 %
FEV6FVC-%Pred-Post: 103 %
FEV6FVC-%Pred-Pre: 101 %
FVC-%Change-Post: -1 %
FVC-%Pred-Post: 95 %
FVC-%Pred-Pre: 97 %
FVC-Post: 3.6 L
FVC-Pre: 3.65 L
Post FEV1/FVC ratio: 84 %
Post FEV6/FVC ratio: 100 %
Pre FEV1/FVC ratio: 83 %
Pre FEV6/FVC Ratio: 99 %
RV % pred: 100 %
RV: 2.08 L
TLC % pred: 107 %
TLC: 5.9 L

## 2021-03-20 MED ORDER — ALBUTEROL SULFATE (2.5 MG/3ML) 0.083% IN NEBU
2.5000 mg | INHALATION_SOLUTION | Freq: Once | RESPIRATORY_TRACT | Status: AC
  Administered 2021-03-20: 2.5 mg via RESPIRATORY_TRACT

## 2021-03-22 ENCOUNTER — Other Ambulatory Visit (HOSPITAL_COMMUNITY): Payer: Self-pay

## 2021-03-24 ENCOUNTER — Other Ambulatory Visit: Payer: Self-pay | Admitting: Family

## 2021-03-24 DIAGNOSIS — U071 COVID-19: Secondary | ICD-10-CM

## 2021-03-25 ENCOUNTER — Other Ambulatory Visit: Payer: Self-pay | Admitting: *Deleted

## 2021-03-25 ENCOUNTER — Other Ambulatory Visit (HOSPITAL_COMMUNITY): Payer: Self-pay

## 2021-03-25 ENCOUNTER — Telehealth: Payer: Self-pay

## 2021-03-25 DIAGNOSIS — D7581 Myelofibrosis: Secondary | ICD-10-CM

## 2021-03-25 MED ORDER — ZOLPIDEM TARTRATE 10 MG PO TABS: 10.0000 mg | ORAL_TABLET | Freq: Every evening | ORAL | 0 refills | Status: DC | PRN

## 2021-03-25 NOTE — Telephone Encounter (Signed)
-----   Message from Ladell Pier, MD sent at 03/24/2021  7:52 PM EDT ----- Please call patient, pulmonary function tests are normal, f/u as scheduled

## 2021-03-25 NOTE — Telephone Encounter (Signed)
Received faxed refill request for Ambien.

## 2021-03-25 NOTE — Telephone Encounter (Signed)
V/M message left to inform Pt of normal pulmonary function test. Informed Pt to return call if she had any questions or concerns.

## 2021-03-31 ENCOUNTER — Other Ambulatory Visit (HOSPITAL_COMMUNITY): Payer: Self-pay

## 2021-03-31 MED ORDER — RUXOLITINIB PHOSPHATE 15 MG PO TABS
ORAL_TABLET | ORAL | 0 refills | Status: DC
  Filled 2021-03-31: qty 45, 30d supply, fill #0

## 2021-04-01 ENCOUNTER — Other Ambulatory Visit (HOSPITAL_COMMUNITY): Payer: Self-pay

## 2021-04-04 ENCOUNTER — Ambulatory Visit (HOSPITAL_COMMUNITY): Payer: BC Managed Care – PPO

## 2021-04-09 ENCOUNTER — Other Ambulatory Visit: Payer: Self-pay | Admitting: Nurse Practitioner

## 2021-04-09 DIAGNOSIS — D7581 Myelofibrosis: Secondary | ICD-10-CM

## 2021-04-09 MED ORDER — HYDROCODONE-ACETAMINOPHEN 5-325 MG PO TABS: 1.0000 | ORAL_TABLET | ORAL | 0 refills | Status: DC | PRN

## 2021-04-09 NOTE — Telephone Encounter (Signed)
refill 

## 2021-04-22 ENCOUNTER — Inpatient Hospital Stay: Payer: BC Managed Care – PPO | Attending: Oncology

## 2021-04-22 ENCOUNTER — Other Ambulatory Visit: Payer: Self-pay

## 2021-04-22 DIAGNOSIS — D649 Anemia, unspecified: Secondary | ICD-10-CM | POA: Diagnosis not present

## 2021-04-22 DIAGNOSIS — D7581 Myelofibrosis: Secondary | ICD-10-CM | POA: Diagnosis not present

## 2021-04-22 LAB — CBC WITH DIFFERENTIAL (CANCER CENTER ONLY)
Abs Immature Granulocytes: 1 10*3/uL — ABNORMAL HIGH (ref 0.00–0.07)
Basophils Absolute: 0 10*3/uL (ref 0.0–0.1)
Basophils Relative: 0 %
Eosinophils Absolute: 0 10*3/uL (ref 0.0–0.5)
Eosinophils Relative: 0 %
HCT: 32.5 % — ABNORMAL LOW (ref 36.0–46.0)
Hemoglobin: 10.3 g/dL — ABNORMAL LOW (ref 12.0–15.0)
Lymphocytes Relative: 25 %
Lymphs Abs: 2.3 10*3/uL (ref 0.7–4.0)
MCH: 28.4 pg (ref 26.0–34.0)
MCHC: 31.7 g/dL (ref 30.0–36.0)
MCV: 89.5 fL (ref 80.0–100.0)
Metamyelocytes Relative: 3 %
Monocytes Absolute: 0.5 10*3/uL (ref 0.1–1.0)
Monocytes Relative: 5 %
Myelocytes: 8 %
Neutro Abs: 5.3 10*3/uL (ref 1.7–7.7)
Neutrophils Relative %: 59 %
Platelet Count: 167 10*3/uL (ref 150–400)
RBC: 3.63 MIL/uL — ABNORMAL LOW (ref 3.87–5.11)
RDW: 16 % — ABNORMAL HIGH (ref 11.5–15.5)
WBC Count: 9 10*3/uL (ref 4.0–10.5)
nRBC: 1 /100 WBC — ABNORMAL HIGH
nRBC: 1.6 % — ABNORMAL HIGH (ref 0.0–0.2)

## 2021-04-23 ENCOUNTER — Other Ambulatory Visit: Payer: Self-pay | Admitting: Oncology

## 2021-04-23 ENCOUNTER — Telehealth: Payer: Self-pay | Admitting: *Deleted

## 2021-04-23 ENCOUNTER — Other Ambulatory Visit: Payer: Self-pay | Admitting: *Deleted

## 2021-04-23 ENCOUNTER — Other Ambulatory Visit (HOSPITAL_COMMUNITY): Payer: Self-pay

## 2021-04-23 DIAGNOSIS — D7581 Myelofibrosis: Secondary | ICD-10-CM

## 2021-04-23 MED ORDER — RUXOLITINIB PHOSPHATE 15 MG PO TABS
ORAL_TABLET | ORAL | 0 refills | Status: DC
  Filled 2021-04-23: qty 45, 30d supply, fill #0

## 2021-04-23 MED ORDER — ZOLPIDEM TARTRATE 10 MG PO TABS: 10.0000 mg | ORAL_TABLET | Freq: Every evening | ORAL | 0 refills | Status: DC | PRN

## 2021-04-23 MED ORDER — ALPRAZOLAM 0.5 MG PO TABS: 0.5000 mg | ORAL_TABLET | Freq: Two times a day (BID) | ORAL | 1 refills | Status: DC | PRN

## 2021-04-23 NOTE — Telephone Encounter (Signed)
-----   Message from Ladell Pier, MD sent at 04/22/2021  8:36 PM EDT ----- Please call patient, cbc looks good, continue Jakafi same dose, schedule office with Sherrill or lisa 6 weeks with cbc

## 2021-04-23 NOTE — Telephone Encounter (Signed)
Notified patient that CBC is good. Will need to have OV in July. Scheduler will call. Scheduling message sent. Jakafi refilled today. Continue same dose.

## 2021-04-23 NOTE — Progress Notes (Signed)
Received faxed request from [facility] for alprazolam and zolpidem refill.

## 2021-04-24 ENCOUNTER — Other Ambulatory Visit (HOSPITAL_COMMUNITY): Payer: Self-pay

## 2021-04-29 ENCOUNTER — Encounter (HOSPITAL_COMMUNITY): Payer: BC Managed Care – PPO | Admitting: Cardiology

## 2021-05-01 ENCOUNTER — Other Ambulatory Visit: Payer: Self-pay | Admitting: Nurse Practitioner

## 2021-05-01 DIAGNOSIS — D7581 Myelofibrosis: Secondary | ICD-10-CM

## 2021-05-02 ENCOUNTER — Other Ambulatory Visit: Payer: Self-pay | Admitting: Nurse Practitioner

## 2021-05-02 DIAGNOSIS — D7581 Myelofibrosis: Secondary | ICD-10-CM

## 2021-05-02 MED ORDER — HYDROCODONE-ACETAMINOPHEN 5-325 MG PO TABS: 1.0000 | ORAL_TABLET | ORAL | 0 refills | Status: DC | PRN

## 2021-05-13 ENCOUNTER — Telehealth (HOSPITAL_COMMUNITY): Payer: Self-pay | Admitting: *Deleted

## 2021-05-13 NOTE — Telephone Encounter (Signed)
Cardiac CT auth in clinical review

## 2021-05-14 ENCOUNTER — Telehealth (HOSPITAL_COMMUNITY): Payer: Self-pay | Admitting: *Deleted

## 2021-05-14 NOTE — Telephone Encounter (Signed)
Reaching out to patient to offer assistance regarding upcoming cardiac imaging study; pt verbalizes understanding of appt date/time, but wishes to be rescheduled due to family health issues.  Patient requested to be re-scheduled in August.  Patient new appointment is for June 27, 2021 at 4:45 pm.  Gordy Clement RN Navigator Cardiac [facility] [phone] office [phone] cell

## 2021-05-15 ENCOUNTER — Ambulatory Visit (HOSPITAL_COMMUNITY): Admission: RE | Admit: 2021-05-15 | Payer: BC Managed Care – PPO | Source: Ambulatory Visit

## 2021-05-20 ENCOUNTER — Other Ambulatory Visit: Payer: Self-pay | Admitting: Oncology

## 2021-05-20 ENCOUNTER — Other Ambulatory Visit (HOSPITAL_COMMUNITY): Payer: Self-pay

## 2021-05-20 DIAGNOSIS — D7581 Myelofibrosis: Secondary | ICD-10-CM

## 2021-05-20 MED ORDER — RUXOLITINIB PHOSPHATE 15 MG PO TABS
ORAL_TABLET | ORAL | 0 refills | Status: DC
  Filled 2021-05-28: qty 45, 30d supply, fill #0

## 2021-05-21 ENCOUNTER — Other Ambulatory Visit: Payer: Self-pay | Admitting: Nurse Practitioner

## 2021-05-21 DIAGNOSIS — D7581 Myelofibrosis: Secondary | ICD-10-CM

## 2021-05-22 ENCOUNTER — Other Ambulatory Visit: Payer: Self-pay | Admitting: Oncology

## 2021-05-22 DIAGNOSIS — D7581 Myelofibrosis: Secondary | ICD-10-CM

## 2021-05-22 MED ORDER — ZOLPIDEM TARTRATE 10 MG PO TABS
10.0000 mg | ORAL_TABLET | Freq: Every evening | ORAL | 0 refills | Status: DC | PRN
Start: 2021-05-22 — End: 2021-05-28

## 2021-05-22 NOTE — Telephone Encounter (Signed)
refill 

## 2021-05-23 ENCOUNTER — Other Ambulatory Visit: Payer: Self-pay | Admitting: Nurse Practitioner

## 2021-05-23 DIAGNOSIS — D7581 Myelofibrosis: Secondary | ICD-10-CM

## 2021-05-23 MED ORDER — HYDROCODONE-ACETAMINOPHEN 5-325 MG PO TABS: 1.0000 | ORAL_TABLET | ORAL | 0 refills | Status: DC | PRN

## 2021-05-27 ENCOUNTER — Other Ambulatory Visit: Payer: Self-pay | Admitting: Oncology

## 2021-05-27 DIAGNOSIS — D7581 Myelofibrosis: Secondary | ICD-10-CM

## 2021-05-27 MED ORDER — HYDROCODONE-ACETAMINOPHEN 5-325 MG PO TABS: 1.0000 | ORAL_TABLET | ORAL | 0 refills | Status: DC | PRN

## 2021-05-28 ENCOUNTER — Other Ambulatory Visit: Payer: Self-pay | Admitting: Oncology

## 2021-05-28 ENCOUNTER — Other Ambulatory Visit (HOSPITAL_COMMUNITY): Payer: Self-pay

## 2021-05-28 DIAGNOSIS — D7581 Myelofibrosis: Secondary | ICD-10-CM

## 2021-06-03 ENCOUNTER — Other Ambulatory Visit: Payer: Self-pay | Admitting: *Deleted

## 2021-06-03 MED ORDER — ZOLPIDEM TARTRATE 10 MG PO TABS: 10.0000 mg | ORAL_TABLET | Freq: Every evening | ORAL | 0 refills | Status: DC | PRN

## 2021-06-03 NOTE — Telephone Encounter (Signed)
Received refill request from pharmacy for zolpidem. Last filled 05/22/21. Approved refill with directions not to fill until it is due w/ pharmacist.

## 2021-06-09 ENCOUNTER — Inpatient Hospital Stay: Payer: BC Managed Care – PPO | Attending: Oncology

## 2021-06-09 ENCOUNTER — Other Ambulatory Visit: Payer: Self-pay

## 2021-06-09 ENCOUNTER — Inpatient Hospital Stay (HOSPITAL_BASED_OUTPATIENT_CLINIC_OR_DEPARTMENT_OTHER): Payer: BC Managed Care – PPO | Admitting: Oncology

## 2021-06-09 ENCOUNTER — Other Ambulatory Visit: Payer: BC Managed Care – PPO

## 2021-06-09 VITALS — BP 155/77 | HR 84 | Temp 97.7°F | Resp 18 | Wt 173.8 lb

## 2021-06-09 DIAGNOSIS — Z853 Personal history of malignant neoplasm of breast: Secondary | ICD-10-CM | POA: Diagnosis not present

## 2021-06-09 DIAGNOSIS — R0609 Other forms of dyspnea: Secondary | ICD-10-CM | POA: Diagnosis not present

## 2021-06-09 DIAGNOSIS — C50912 Malignant neoplasm of unspecified site of left female breast: Secondary | ICD-10-CM | POA: Diagnosis not present

## 2021-06-09 DIAGNOSIS — D7581 Myelofibrosis: Secondary | ICD-10-CM | POA: Insufficient documentation

## 2021-06-09 DIAGNOSIS — R161 Splenomegaly, not elsewhere classified: Secondary | ICD-10-CM | POA: Diagnosis not present

## 2021-06-09 LAB — CBC WITH DIFFERENTIAL (CANCER CENTER ONLY)
Abs Immature Granulocytes: 0.85 10*3/uL — ABNORMAL HIGH (ref 0.00–0.07)
Basophils Absolute: 0.1 10*3/uL (ref 0.0–0.1)
Basophils Relative: 1 %
Eosinophils Absolute: 0 10*3/uL (ref 0.0–0.5)
Eosinophils Relative: 0 %
HCT: 31 % — ABNORMAL LOW (ref 36.0–46.0)
Hemoglobin: 9.7 g/dL — ABNORMAL LOW (ref 12.0–15.0)
Immature Granulocytes: 13 %
Lymphocytes Relative: 17 %
Lymphs Abs: 1.1 10*3/uL (ref 0.7–4.0)
MCH: 28.2 pg (ref 26.0–34.0)
MCHC: 31.3 g/dL (ref 30.0–36.0)
MCV: 90.1 fL (ref 80.0–100.0)
Monocytes Absolute: 0.7 10*3/uL (ref 0.1–1.0)
Monocytes Relative: 10 %
Neutro Abs: 3.7 10*3/uL (ref 1.7–7.7)
Neutrophils Relative %: 59 %
Platelet Count: 106 10*3/uL — ABNORMAL LOW (ref 150–400)
RBC: 3.44 MIL/uL — ABNORMAL LOW (ref 3.87–5.11)
RDW: 17.1 % — ABNORMAL HIGH (ref 11.5–15.5)
WBC Count: 6.4 10*3/uL (ref 4.0–10.5)
nRBC: 2.2 % — ABNORMAL HIGH (ref 0.0–0.2)

## 2021-06-09 LAB — CMP (CANCER CENTER ONLY)
ALT: 16 U/L (ref 0–44)
AST: 19 U/L (ref 15–41)
Albumin: 4.2 g/dL (ref 3.5–5.0)
Alkaline Phosphatase: 56 U/L (ref 38–126)
Anion gap: 7 (ref 5–15)
BUN: 19 mg/dL (ref 6–20)
CO2: 24 mmol/L (ref 22–32)
Calcium: 8.8 mg/dL — ABNORMAL LOW (ref 8.9–10.3)
Chloride: 106 mmol/L (ref 98–111)
Creatinine: 0.96 mg/dL (ref 0.44–1.00)
GFR, Estimated: >60 mL/min (ref >60)
Glucose, Bld: 100 mg/dL — ABNORMAL HIGH (ref 70–99)
Potassium: 4.6 mmol/L (ref 3.5–5.1)
Sodium: 137 mmol/L (ref 135–145)
Total Bilirubin: 0.5 mg/dL (ref 0.3–1.2)
Total Protein: 6.8 g/dL (ref 6.5–8.1)

## 2021-06-09 MED ORDER — IBUPROFEN 800 MG PO TABS: 800.0000 mg | ORAL_TABLET | Freq: Two times a day (BID) | ORAL | 1 refills | Status: AC | PRN

## 2021-06-09 NOTE — Progress Notes (Signed)
[place] OFFICE PROGRESS NOTE   Diagnosis: Myelofibrosis  INTERVAL HISTORY:   [name] returns for a scheduled visit.  She reports dyspnea has completely resolved.  No new complaint.  She continues hydrocodone and ibuprofen as needed for bone pain.  She bruises easily.  No other bleeding.  No fever or night sweats. A right mammogram on 02/06/2021 was negative.  Objective:  Vital signs in last 24 hours:  Blood pressure (!) 155/77, pulse 84, temperature 97.7 F (36.5 C), temperature source Tympanic, resp. rate 18, weight 173 lb 12.8 oz (78.8 kg), SpO2 98 %.    Resp: Lungs clear bilaterally Cardio: Regular rate and rhythm GI: No hepatomegaly, the spleen is palpable in the left abdomen extending inferiorly and medially to the umbilicus Vascular: No leg edema  Lab Results:  Lab Results  Component Value Date   WBC 6.4 06/09/2021   HGB 9.7 (L) 06/09/2021   HCT 31.0 (L) 06/09/2021   MCV 90.1 06/09/2021   PLT 106 (L) 06/09/2021   NEUTROABS 3.7 06/09/2021    CMP  Lab Results  Component Value Date   NA 137 06/09/2021   K 4.6 06/09/2021   CL 106 06/09/2021   CO2 24 06/09/2021   GLUCOSE 100 (H) 06/09/2021   BUN 19 06/09/2021   CREATININE 0.96 06/09/2021   CALCIUM 8.8 (L) 06/09/2021   PROT 6.8 06/09/2021   ALBUMIN 4.2 06/09/2021   AST 19 06/09/2021   ALT 16 06/09/2021   ALKPHOS 56 06/09/2021   BILITOT 0.5 06/09/2021   GFRNONAA >60 06/09/2021   GFRAA 59 (L) 06/12/2020    Medications: I have reviewed the patient's current medications.   Assessment/Plan: Stage II left-sided breast cancer diagnosed in February 2000. Screening mammogram 10/05/2011 with suggestion of a subcentimeter oval mass on the left superiorly and no suspicious masses, calcifications or architectural distortion in the right breast. She underwent biopsy of the "asymmetric density of concern in the superior aspect of the right breast" on 10/13/2011. Pathology showed a fibroadenoma.    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 History of intermittent low-grade fever and "night sweats," likely systemic manifestations of myelofibrosis. History of Nose bleeding-likely related to myelofibrosis with dysfunctional platelets 7. 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   8. Elevated creatinine July 2015-normal 08/06/2014, potentially related to blood pressure medication   9.  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  Increased low back pain July 2020 MRI lumbar spine 06/26/2019- diffuse T1 signal compatible with myelofibrosis, hyperintense T2 signal at T12 and L3, most likely hemangiomas Bone scan 07/04/2019- diffuse increased and homogenous uptake compatible with myelofibrosis, no focal increased activity at T12 or L3 11.  Exertional dyspnea-etiology unclear- evaluated by cardiology    Disposition: [name] appears stable.  The platelet count is mildly decreased and has been in this range in the past.  She will continue Jakafi at  the current dose.  She will return for an office and lab visit in 3 months.  She continues hydrocodone and ibuprofen as needed for pain.  She feels the splenomegaly and pain have improved since beginning Lynelle Doctor, MD  06/09/2021  2:58 PM

## 2021-06-13 ENCOUNTER — Other Ambulatory Visit: Payer: Self-pay | Admitting: Oncology

## 2021-06-13 DIAGNOSIS — D7581 Myelofibrosis: Secondary | ICD-10-CM

## 2021-06-13 NOTE — Telephone Encounter (Signed)
refill 

## 2021-06-16 ENCOUNTER — Other Ambulatory Visit: Payer: Self-pay | Admitting: Nurse Practitioner

## 2021-06-16 DIAGNOSIS — D7581 Myelofibrosis: Secondary | ICD-10-CM

## 2021-06-16 MED ORDER — HYDROCODONE-ACETAMINOPHEN 5-325 MG PO TABS: 1.0000 | ORAL_TABLET | ORAL | 0 refills | Status: DC | PRN

## 2021-06-16 NOTE — Telephone Encounter (Signed)
refill 

## 2021-06-17 ENCOUNTER — Encounter (HOSPITAL_COMMUNITY): Payer: BC Managed Care – PPO | Admitting: Cardiology

## 2021-06-20 ENCOUNTER — Other Ambulatory Visit: Payer: Self-pay | Admitting: *Deleted

## 2021-06-20 MED ORDER — ALPRAZOLAM 0.5 MG PO TABS: 0.5000 mg | ORAL_TABLET | Freq: Two times a day (BID) | ORAL | 1 refills | Status: DC | PRN

## 2021-06-24 ENCOUNTER — Other Ambulatory Visit (HOSPITAL_COMMUNITY): Payer: Self-pay

## 2021-06-26 ENCOUNTER — Other Ambulatory Visit: Payer: Self-pay | Admitting: Oncology

## 2021-06-26 ENCOUNTER — Other Ambulatory Visit (HOSPITAL_COMMUNITY): Payer: Self-pay

## 2021-06-26 DIAGNOSIS — D7581 Myelofibrosis: Secondary | ICD-10-CM

## 2021-06-26 DIAGNOSIS — Z01419 Encounter for gynecological examination (general) (routine) without abnormal findings: Secondary | ICD-10-CM | POA: Diagnosis not present

## 2021-06-26 DIAGNOSIS — Z6827 Body mass index (BMI) 27.0-27.9, adult: Secondary | ICD-10-CM | POA: Diagnosis not present

## 2021-06-26 MED ORDER — RUXOLITINIB PHOSPHATE 15 MG PO TABS
ORAL_TABLET | ORAL | 2 refills | Status: DC
  Filled 2021-06-26: qty 45, 30d supply, fill #0
  Filled 2021-07-25: qty 45, 30d supply, fill #1
  Filled 2021-08-22: qty 45, 30d supply, fill #2

## 2021-06-27 ENCOUNTER — Encounter (HOSPITAL_COMMUNITY): Payer: Self-pay

## 2021-06-27 ENCOUNTER — Other Ambulatory Visit (HOSPITAL_COMMUNITY): Payer: BC Managed Care – PPO

## 2021-06-30 ENCOUNTER — Other Ambulatory Visit (HOSPITAL_COMMUNITY): Payer: Self-pay

## 2021-07-04 ENCOUNTER — Other Ambulatory Visit: Payer: Self-pay | Admitting: *Deleted

## 2021-07-04 DIAGNOSIS — D7581 Myelofibrosis: Secondary | ICD-10-CM

## 2021-07-04 MED ORDER — ZOLPIDEM TARTRATE 10 MG PO TABS: 10.0000 mg | ORAL_TABLET | Freq: Every evening | ORAL | 0 refills | Status: DC | PRN

## 2021-07-07 ENCOUNTER — Other Ambulatory Visit: Payer: Self-pay | Admitting: Nurse Practitioner

## 2021-07-07 DIAGNOSIS — D7581 Myelofibrosis: Secondary | ICD-10-CM

## 2021-07-07 MED ORDER — HYDROCODONE-ACETAMINOPHEN 5-325 MG PO TABS: 1.0000 | ORAL_TABLET | ORAL | 0 refills | Status: DC | PRN

## 2021-07-23 ENCOUNTER — Other Ambulatory Visit (HOSPITAL_COMMUNITY): Payer: Self-pay

## 2021-07-25 ENCOUNTER — Other Ambulatory Visit (HOSPITAL_COMMUNITY): Payer: Self-pay

## 2021-07-29 ENCOUNTER — Other Ambulatory Visit: Payer: Self-pay | Admitting: Nurse Practitioner

## 2021-07-29 ENCOUNTER — Other Ambulatory Visit (HOSPITAL_COMMUNITY): Payer: Self-pay

## 2021-07-29 DIAGNOSIS — D7581 Myelofibrosis: Secondary | ICD-10-CM

## 2021-07-29 MED ORDER — HYDROCODONE-ACETAMINOPHEN 5-325 MG PO TABS: 1.0000 | ORAL_TABLET | ORAL | 0 refills | Status: DC | PRN

## 2021-07-29 NOTE — Telephone Encounter (Signed)
refill 

## 2021-08-04 ENCOUNTER — Other Ambulatory Visit: Payer: Self-pay | Admitting: *Deleted

## 2021-08-04 DIAGNOSIS — D7581 Myelofibrosis: Secondary | ICD-10-CM

## 2021-08-04 MED ORDER — ZOLPIDEM TARTRATE 10 MG PO TABS: 10.0000 mg | ORAL_TABLET | Freq: Every evening | ORAL | 0 refills | Status: DC | PRN

## 2021-08-04 NOTE — Telephone Encounter (Signed)
Faxed refill request for Ambien from [facility].

## 2021-08-12 ENCOUNTER — Other Ambulatory Visit: Payer: Self-pay | Admitting: Oncology

## 2021-08-13 ENCOUNTER — Other Ambulatory Visit: Payer: Self-pay | Admitting: Nurse Practitioner

## 2021-08-13 DIAGNOSIS — D7581 Myelofibrosis: Secondary | ICD-10-CM

## 2021-08-13 MED ORDER — HYDROCODONE-ACETAMINOPHEN 5-325 MG PO TABS: 1.0000 | ORAL_TABLET | ORAL | 0 refills | Status: DC | PRN

## 2021-08-14 ENCOUNTER — Other Ambulatory Visit: Payer: Self-pay

## 2021-08-14 MED ORDER — ALPRAZOLAM 0.5 MG PO TABS: 0.5000 mg | ORAL_TABLET | Freq: Two times a day (BID) | ORAL | 1 refills | Status: DC | PRN

## 2021-08-22 ENCOUNTER — Other Ambulatory Visit (HOSPITAL_COMMUNITY): Payer: Self-pay

## 2021-08-28 ENCOUNTER — Other Ambulatory Visit (HOSPITAL_COMMUNITY): Payer: Self-pay

## 2021-08-29 ENCOUNTER — Other Ambulatory Visit: Payer: Self-pay | Admitting: Oncology

## 2021-08-29 DIAGNOSIS — D7581 Myelofibrosis: Secondary | ICD-10-CM

## 2021-09-01 ENCOUNTER — Other Ambulatory Visit: Payer: Self-pay | Admitting: Oncology

## 2021-09-01 DIAGNOSIS — D7581 Myelofibrosis: Secondary | ICD-10-CM

## 2021-09-03 ENCOUNTER — Other Ambulatory Visit: Payer: Self-pay | Admitting: Nurse Practitioner

## 2021-09-03 DIAGNOSIS — D7581 Myelofibrosis: Secondary | ICD-10-CM

## 2021-09-04 ENCOUNTER — Inpatient Hospital Stay: Payer: BC Managed Care – PPO

## 2021-09-04 ENCOUNTER — Other Ambulatory Visit: Payer: Self-pay | Admitting: Nurse Practitioner

## 2021-09-04 ENCOUNTER — Other Ambulatory Visit: Payer: Self-pay | Admitting: Oncology

## 2021-09-04 ENCOUNTER — Inpatient Hospital Stay: Payer: BC Managed Care – PPO | Admitting: Oncology

## 2021-09-04 DIAGNOSIS — D7581 Myelofibrosis: Secondary | ICD-10-CM

## 2021-09-04 MED ORDER — ZOLPIDEM TARTRATE 10 MG PO TABS: 10.0000 mg | ORAL_TABLET | Freq: Every evening | ORAL | 0 refills | Status: DC | PRN

## 2021-09-04 MED ORDER — HYDROCODONE-ACETAMINOPHEN 5-325 MG PO TABS: 1.0000 | ORAL_TABLET | ORAL | 0 refills | Status: DC | PRN

## 2021-09-08 ENCOUNTER — Ambulatory Visit: Payer: BC Managed Care – PPO | Admitting: Oncology

## 2021-09-08 ENCOUNTER — Other Ambulatory Visit: Payer: BC Managed Care – PPO

## 2021-09-17 ENCOUNTER — Other Ambulatory Visit (HOSPITAL_COMMUNITY): Payer: Self-pay

## 2021-09-19 ENCOUNTER — Other Ambulatory Visit (HOSPITAL_COMMUNITY): Payer: Self-pay

## 2021-09-19 ENCOUNTER — Other Ambulatory Visit: Payer: Self-pay | Admitting: Oncology

## 2021-09-19 DIAGNOSIS — D7581 Myelofibrosis: Secondary | ICD-10-CM

## 2021-09-22 ENCOUNTER — Other Ambulatory Visit: Payer: Self-pay

## 2021-09-22 ENCOUNTER — Other Ambulatory Visit (HOSPITAL_COMMUNITY): Payer: Self-pay

## 2021-09-22 ENCOUNTER — Other Ambulatory Visit: Payer: Self-pay | Admitting: Oncology

## 2021-09-22 ENCOUNTER — Inpatient Hospital Stay: Payer: BC Managed Care – PPO | Admitting: Oncology

## 2021-09-22 ENCOUNTER — Inpatient Hospital Stay: Payer: BC Managed Care – PPO | Attending: Oncology

## 2021-09-22 DIAGNOSIS — Z853 Personal history of malignant neoplasm of breast: Secondary | ICD-10-CM | POA: Diagnosis not present

## 2021-09-22 DIAGNOSIS — D7581 Myelofibrosis: Secondary | ICD-10-CM | POA: Insufficient documentation

## 2021-09-22 DIAGNOSIS — C50912 Malignant neoplasm of unspecified site of left female breast: Secondary | ICD-10-CM

## 2021-09-22 LAB — CMP (CANCER CENTER ONLY)
ALT: 18 U/L (ref 0–44)
AST: 21 U/L (ref 15–41)
Albumin: 4.1 g/dL (ref 3.5–5.0)
Alkaline Phosphatase: 48 U/L (ref 38–126)
Anion gap: 8 (ref 5–15)
BUN: 15 mg/dL (ref 6–20)
CO2: 25 mmol/L (ref 22–32)
Calcium: 8.9 mg/dL (ref 8.9–10.3)
Chloride: 106 mmol/L (ref 98–111)
Creatinine: 0.8 mg/dL (ref 0.44–1.00)
GFR, Estimated: >60 mL/min (ref >60)
Glucose, Bld: 102 mg/dL — ABNORMAL HIGH (ref 70–99)
Potassium: 3.8 mmol/L (ref 3.5–5.1)
Sodium: 139 mmol/L (ref 135–145)
Total Bilirubin: 0.6 mg/dL (ref 0.3–1.2)
Total Protein: 6.7 g/dL (ref 6.5–8.1)

## 2021-09-22 LAB — CBC WITH DIFFERENTIAL (CANCER CENTER ONLY)
Abs Immature Granulocytes: 1.1 10*3/uL — ABNORMAL HIGH (ref 0.00–0.07)
Band Neutrophils: 9 %
Basophils Absolute: 0 10*3/uL (ref 0.0–0.1)
Basophils Relative: 0 %
Blasts: 1 %
Eosinophils Absolute: 0 10*3/uL (ref 0.0–0.5)
Eosinophils Relative: 0 %
HCT: 29.9 % — ABNORMAL LOW (ref 36.0–46.0)
Hemoglobin: 9.5 g/dL — ABNORMAL LOW (ref 12.0–15.0)
Lymphocytes Relative: 19 %
Lymphs Abs: 1.4 10*3/uL (ref 0.7–4.0)
MCH: 28.4 pg (ref 26.0–34.0)
MCHC: 31.8 g/dL (ref 30.0–36.0)
MCV: 89.5 fL (ref 80.0–100.0)
Metamyelocytes Relative: 6 %
Monocytes Absolute: 0.2 10*3/uL (ref 0.1–1.0)
Monocytes Relative: 2 %
Myelocytes: 7 %
Neutro Abs: 4.9 10*3/uL (ref 1.7–7.7)
Neutrophils Relative %: 55 %
Platelet Count: 131 10*3/uL — ABNORMAL LOW (ref 150–400)
Promyelocytes Relative: 1 %
RBC: 3.34 MIL/uL — ABNORMAL LOW (ref 3.87–5.11)
RDW: 16.4 % — ABNORMAL HIGH (ref 11.5–15.5)
Smear Review: NORMAL
WBC Count: 7.6 10*3/uL (ref 4.0–10.5)
WBC Morphology: ABNORMAL
nRBC: 2 /100 WBC — ABNORMAL HIGH
nRBC: 2.4 % — ABNORMAL HIGH (ref 0.0–0.2)

## 2021-09-22 MED ORDER — RUXOLITINIB PHOSPHATE 15 MG PO TABS
ORAL_TABLET | ORAL | 2 refills | Status: DC
  Filled 2021-09-22: qty 45, 30d supply, fill #0
  Filled 2021-11-03: qty 45, 30d supply, fill #1
  Filled 2021-11-26: qty 45, 30d supply, fill #2

## 2021-09-22 MED ORDER — IBUPROFEN 800 MG PO TABS: 800.0000 mg | ORAL_TABLET | Freq: Two times a day (BID) | ORAL | 5 refills | Status: DC | PRN

## 2021-09-22 MED ORDER — HYDROCODONE-ACETAMINOPHEN 5-325 MG PO TABS: 1.0000 | ORAL_TABLET | ORAL | 0 refills | Status: DC | PRN

## 2021-09-22 NOTE — Progress Notes (Signed)
[place] OFFICE PROGRESS NOTE   Diagnosis: Myelofibrosis  INTERVAL HISTORY:   [name] returns as scheduled.  She continues to have malaise.  She continues Music therapist.  She takes ibuprofen and hydrocodone as needed for pain.  No new complaint.  Objective:  Vital signs in last 24 hours:  There were no vitals taken for this visit.    Resp: Lungs clear bilaterally Cardio: Regular rate and rhythm GI: No hepatomegaly.  The spleen is palpable in the left abdomen extending a few fingers below the umbilicus and to the midline Vascular: No leg edema   Lab Results:  Lab Results  Component Value Date   WBC 7.6 09/22/2021   HGB 9.5 (L) 09/22/2021   HCT 29.9 (L) 09/22/2021   MCV 89.5 09/22/2021   PLT 131 (L) 09/22/2021   NEUTROABS 4.9 09/22/2021    CMP  Lab Results  Component Value Date   NA 139 09/22/2021   K 3.8 09/22/2021   CL 106 09/22/2021   CO2 25 09/22/2021   GLUCOSE 102 (H) 09/22/2021   BUN 15 09/22/2021   CREATININE 0.80 09/22/2021   CALCIUM 8.9 09/22/2021   PROT 6.7 09/22/2021   ALBUMIN 4.1 09/22/2021   AST 21 09/22/2021   ALT 18 09/22/2021   ALKPHOS 48 09/22/2021   BILITOT 0.6 09/22/2021   GFRNONAA >60 09/22/2021   GFRAA 59 (L) 06/12/2020    No results found for: CEA1, CEA, CAN199, CA125  No results found for: INR, LABPROT  Imaging:  No results found.  Medications: I have reviewed the patient's current medications.   Assessment/Plan: Stage II left-sided breast cancer diagnosed in February 2000. Screening mammogram 10/05/2011 with suggestion of a subcentimeter oval mass on the left superiorly and no suspicious masses, calcifications or architectural distortion in the right breast. She underwent biopsy of the "asymmetric density of concern in the superior aspect of the right breast" on 10/13/2011. Pathology showed a fibroadenoma.   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   Splenomegaly secondary to  myelofibrosis, stable. 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 History of intermittent low-grade fever and "night sweats," likely systemic manifestations of myelofibrosis. History of Nose bleeding-likely related to myelofibrosis with dysfunctional platelets 7. 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   8. Elevated creatinine July 2015-normal 08/06/2014, potentially related to blood pressure medication   9.  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  Increased low back pain July 2020 MRI lumbar spine 06/26/2019- diffuse T1 signal compatible with myelofibrosis, hyperintense T2 signal at T12 and L3, most likely hemangiomas Bone scan 07/04/2019- diffuse increased and homogenous uptake compatible with myelofibrosis, no focal increased activity at T12 or L3 11.  Exertional dyspnea-etiology unclear- evaluated by cardiology       Disposition: [name] appears stable.  The CBC has not changed significantly.  She will continue ruxolitinib.  She will return for a lab visit in 3 months and an office visit in 6 months.  Betsy Coder, MD  09/22/2021  3:58 PM

## 2021-09-23 ENCOUNTER — Other Ambulatory Visit (HOSPITAL_COMMUNITY): Payer: Self-pay

## 2021-09-24 ENCOUNTER — Other Ambulatory Visit (HOSPITAL_COMMUNITY): Payer: Self-pay

## 2021-10-03 ENCOUNTER — Other Ambulatory Visit: Payer: Self-pay | Admitting: Nurse Practitioner

## 2021-10-03 DIAGNOSIS — D7581 Myelofibrosis: Secondary | ICD-10-CM

## 2021-10-06 ENCOUNTER — Other Ambulatory Visit: Payer: Self-pay | Admitting: Surgery

## 2021-10-06 DIAGNOSIS — D7581 Myelofibrosis: Secondary | ICD-10-CM

## 2021-10-06 MED ORDER — ZOLPIDEM TARTRATE 10 MG PO TABS: 10.0000 mg | ORAL_TABLET | Freq: Every evening | ORAL | 0 refills | Status: DC | PRN

## 2021-10-12 ENCOUNTER — Other Ambulatory Visit: Payer: Self-pay | Admitting: Oncology

## 2021-10-13 ENCOUNTER — Other Ambulatory Visit: Payer: Self-pay | Admitting: Oncology

## 2021-10-13 ENCOUNTER — Other Ambulatory Visit: Payer: Self-pay | Admitting: Nurse Practitioner

## 2021-10-13 DIAGNOSIS — D7581 Myelofibrosis: Secondary | ICD-10-CM

## 2021-10-13 MED ORDER — HYDROCODONE-ACETAMINOPHEN 5-325 MG PO TABS
1.0000 | ORAL_TABLET | ORAL | 0 refills | Status: DC | PRN
Start: 2021-10-13 — End: 2021-10-30

## 2021-10-20 ENCOUNTER — Other Ambulatory Visit (HOSPITAL_COMMUNITY): Payer: Self-pay

## 2021-10-28 ENCOUNTER — Encounter: Payer: Self-pay | Admitting: Family Medicine

## 2021-10-28 ENCOUNTER — Ambulatory Visit (INDEPENDENT_AMBULATORY_CARE_PROVIDER_SITE_OTHER): Payer: BC Managed Care – PPO | Admitting: Family Medicine

## 2021-10-28 ENCOUNTER — Other Ambulatory Visit: Payer: Self-pay

## 2021-10-28 VITALS — BP 124/80 | HR 76 | Temp 97.3°F | Ht 69.0 in | Wt 169.8 lb

## 2021-10-28 DIAGNOSIS — D7581 Myelofibrosis: Secondary | ICD-10-CM

## 2021-10-28 DIAGNOSIS — Z8669 Personal history of other diseases of the nervous system and sense organs: Secondary | ICD-10-CM | POA: Diagnosis not present

## 2021-10-28 MED ORDER — IBUPROFEN 800 MG PO TABS: 800.0000 mg | ORAL_TABLET | Freq: Every day | ORAL | 5 refills | Status: DC | PRN

## 2021-10-28 MED ORDER — ZOLPIDEM TARTRATE 10 MG PO TABS: 10.0000 mg | ORAL_TABLET | Freq: Every evening | ORAL | 5 refills | Status: DC | PRN

## 2021-10-28 MED ORDER — ESCITALOPRAM OXALATE 20 MG PO TABS: 20.0000 mg | ORAL_TABLET | Freq: Every day | ORAL | 3 refills | Status: DC

## 2021-10-28 NOTE — Patient Instructions (Addendum)
Health Maintenance Due  Topic Date Due   Pneumococcal Vaccine 55-57 Years old (1 - PCV) - patient wants to hold off today.  Never done   COLONOSCOPY (Pts 45-58yrs Insurance coverage will need to be confirmed)  - Please investigate who did your last colonoscopy and then we can have you sign a Release of Information form to get those records.  Never done   PAP SMEAR-Modifier  - Sign release of information at the check out desk for last PAP Smear from Dr. Milta Deiters.  07/21/2018   Sign release of information at the check out desk for [facility] as well- team please change her modifier to yearly on breast cancer /mammogram  Trial half tablet of Ambien if possible. Do no drive for 8 hours after taking this medication.  I would also be open to prescribing hydrocodone but I would need guidance from Dr. Benay Spice in regards to your myelofibrosis.  Also should avoid taking Ambien, Alprazolam, and Hydrocodone within 8 hours of each other.  Recommended follow up: Return in about 6 months (around 04/28/2022) for a physical.

## 2021-10-28 NOTE — Progress Notes (Signed)
Phone: [phone]   Subjective:  Patient presents today to establish care.  Prior patient of Dr. Raliegh Ip years ago- Dr. Leanne Chang prior to that Chief Complaint  Patient presents with   New Patient   See problem oriented charting  The following were reviewed and entered/updated in epic: Past Medical History:  Diagnosis Date   Breast cancer ([place])    Myelofibrosis ([place])    Patient Active Problem List   Diagnosis Date Noted   History of Guillain-Barre syndrome 10/28/2021    Priority: High   Myelofibrosis ([place]) 02/23/2008    Priority: High   History of breast cancer     Priority: Medium    Renal insufficiency, mild 06/12/2014    Priority: Low   Past Surgical History:  Procedure Laterality Date   ABDOMINAL HYSTERECTOMY     BREAST SURGERY     for cancer    Family History  Problem Relation Age of Onset   Other Mother        NASH lead to hospice   Atrial fibrillation Mother    Diabetes Mother    Hypertension Mother    Healthy Father    Healthy Brother    Lung cancer Maternal Grandmother        dx in her 87x - smoker   Other Paternal Grandmother        unrecalled cause of death   Other Paternal Grandfather        unrecalled cause of death   Breast cancer Maternal Aunt        dx in her 16s   Lung cancer Maternal Aunt        dx in her 41s - smoker   Leukemia Cousin 4       maternal first cousin - child of aunt with breast cancer   Breast cancer Cousin        dx in her 16s - daughter of aunt with lung cancer   Colon cancer Cousin        paternal first cousin dx in his 71s   Breast cancer Other        MGM's sister dx in her 28s   Breast cancer Other        mother's maternal first cousin dx in her 74s - BRCA neg   Prostate cancer Other        MGM's brother    Medications- reviewed and updated Current Outpatient Medications  Medication Sig Dispense Refill   ALPRAZolam (XANAX) 0.5 MG tablet TAKE 1 TABLET BY MOUTH TWICE A DAY AS NEEDED FOR ANXIETY 60 tablet 1    escitalopram (LEXAPRO) 20 MG tablet Take 1 tablet (20 mg total) by mouth daily. 90 tablet 3   HYDROcodone-acetaminophen (NORCO/VICODIN) 5-325 MG tablet Take 1 tablet by mouth every 4 (four) hours as needed. 75 tablet 0   ibuprofen (ADVIL) 800 MG tablet Take 1 tablet (800 mg total) by mouth daily as needed (minimize use if possible- increases stomach bleeding risk, heart risk, and kidney function issues). for pain 60 tablet 5   ruxolitinib phosphate (JAKAFI) 15 MG tablet TAKE 15 MG (1 TABLET) BY MOUTH ALTERNATING WITH 30 MG (2 TABLETS) DAILY 45 tablet 2   zolpidem (AMBIEN) 10 MG tablet Take 1 tablet (10 mg total) by mouth at bedtime as needed for sleep (trial half tablet if possible. do not take this within 8 hours of alprazolam or dirve for 8 hours after taking). for insomnia 30 tablet 5   No current facility-administered medications for  this visit.    Allergies-reviewed and updated Allergies  Allergen Reactions   Clarithromycin Rash    Social History   Social History Narrative   Married. 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 Oropharynx normal. TM normal. Eyes: sclera and lids normal, PERRLA Neck: no thyromegaly, no cervical lymphadenopathy CV: RRR no murmurs rubs or gallops Lungs: CTAB no crackles, wheeze, rhonchi Abdomen: soft/nontender/nondistended/normal bowel sounds. No rebound or guarding.  Ext: no edema Skin: warm, dry Neuro: grossly normal, normal gait    Assessment and Plan:    # Myelofibrosis - follows with Dr. Benay Spice from Oncology S: diagnosed in 2009-  primary not related to breast cancer.   Patient with recent visit on 09/22/2021-she was continued on Jakafi 15 mg and 30 mg alternating with plan for 31-monthlabs and 657-monthollow-up  Inside out aching sensation and on pain medication as a result- shes on hydrocodone as a result through oncology. Also on ibuprofen 80059ms a result A/P: Stable and pain is reasonably well managed- continue current meds.   - I am willing to hel pwith ibuprofen refills - I would be open to prescribing hydrocodone but would need some guidance from Dr. SheBenay Spice I have not prescribed int he past for this indication- takes 3 per day- steady for years thankfully -also ideally should avoid using alprazolam, ambien, and hydrocodone within 8 hours of each other  # Anxiety/ hot flashes S:Medication: Escitalopram 20 mg started by GYN (was placed on this since couldn't do HRT- was for hot flashes, Xanax 0.5 mg as needed- for mental stress of physical ailments after having 2 separate types of cancer A/P: reasonably well controlled- some recent stress with mother being ill and on hospice.   -I am willing to help with refills on either of these  # Insomnia S:medications: Ambien 10 mg as needed perhaps every other night- worse with recent stressors  A/P: reasonable control - ideally would limit to 5 mg with her being female- if gets to 63 12d still needing would definitely try to reduce or if has side effects   #hyperlipidemia S: Medication:none  Lab Results  Component Value Date   CHOL 147 12/30/2020   HDL 29 (L) 12/30/2020   LDLCALC 63 12/30/2020   LDLDIRECT 77.1 09/26/2020   TRIG 276 (H) 12/30/2020   CHOLHDL 5.1 12/30/2020   A/P: mild but 10 year ascvd risk has not been significantly elevated  #Hm- colonoscopy- 2017 with unknown provider-  she will find provider so we can request records -opts out of other vaccines with GBS history- influenza contraindicated   # 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 sheBenay SpiceRecommended follow up: Return in about 6 months (around 04/28/2022) for a physical. Future Appointments  Date Time Provider [place]arks[place]  12/23/2021  7:45 AM [facility] PHLEBOTOMIST [facility] None  03/23/2022  2:15 PM [facility] PHLEBOTOMIST [facility] None  03/23/2022  2:40 PM Ladell Pier, MD [facility] None  05/08/2022  2:40 PM Yong Channel Brayton Mars, MD [facility]    Meds ordered this encounter  Medications   escitalopram (LEXAPRO) 20 MG tablet    Sig: Take 1 tablet (20 mg total) by mouth daily.    Dispense:  90 tablet    Refill:  3   ibuprofen (ADVIL) 800 MG tablet    Sig: Take 1 tablet (800 mg total) by mouth daily as needed (minimize use if possible- increases stomach bleeding risk, heart risk, and kidney function issues). for pain    Dispense:  60 tablet    Refill:  5   zolpidem (AMBIEN) 10 MG tablet    Sig: Take 1 tablet (10 mg total) by mouth at bedtime as needed for sleep (trial half tablet if possible. do not take this within 8 hours of alprazolam or dirve for 8 hours after taking). for insomnia    Dispense:  30 tablet    Refill:  5    Not to exceed 5 additional fills before 03/03/2022   Return precautions advised. Garret Reddish, MD

## 2021-10-30 ENCOUNTER — Telehealth: Payer: Self-pay

## 2021-10-30 ENCOUNTER — Other Ambulatory Visit: Payer: Self-pay | Admitting: Nurse Practitioner

## 2021-10-30 DIAGNOSIS — D7581 Myelofibrosis: Secondary | ICD-10-CM

## 2021-10-30 MED ORDER — HYDROCODONE-ACETAMINOPHEN 5-325 MG PO TABS: 1.0000 | ORAL_TABLET | ORAL | 0 refills | Status: DC | PRN

## 2021-10-30 NOTE — Telephone Encounter (Signed)
Pt request a refill of hydrocodone-acetaminophen through my chart and she called. I printed out the message and left it on provider desk

## 2021-10-30 NOTE — Telephone Encounter (Signed)
If you already filled this, please disregard

## 2021-11-03 ENCOUNTER — Other Ambulatory Visit (HOSPITAL_COMMUNITY): Payer: Self-pay

## 2021-11-06 ENCOUNTER — Encounter: Payer: Self-pay | Admitting: Obstetrics & Gynecology

## 2021-11-19 ENCOUNTER — Other Ambulatory Visit: Payer: Self-pay | Admitting: Nurse Practitioner

## 2021-11-19 DIAGNOSIS — D7581 Myelofibrosis: Secondary | ICD-10-CM

## 2021-11-19 NOTE — Telephone Encounter (Signed)
Placed on providers desk

## 2021-11-20 ENCOUNTER — Other Ambulatory Visit: Payer: Self-pay | Admitting: Physician Assistant

## 2021-11-20 DIAGNOSIS — D7581 Myelofibrosis: Secondary | ICD-10-CM

## 2021-11-20 MED ORDER — HYDROCODONE-ACETAMINOPHEN 5-325 MG PO TABS: 1.0000 | ORAL_TABLET | ORAL | 0 refills | Status: DC | PRN

## 2021-11-20 NOTE — Telephone Encounter (Signed)
Refill. Bringing you hard copy as well.

## 2021-11-26 ENCOUNTER — Other Ambulatory Visit (HOSPITAL_COMMUNITY): Payer: Self-pay

## 2021-12-01 ENCOUNTER — Other Ambulatory Visit (HOSPITAL_COMMUNITY): Payer: Self-pay

## 2021-12-04 ENCOUNTER — Other Ambulatory Visit: Payer: Self-pay | Admitting: Physician Assistant

## 2021-12-04 DIAGNOSIS — D7581 Myelofibrosis: Secondary | ICD-10-CM

## 2021-12-05 ENCOUNTER — Other Ambulatory Visit: Payer: Self-pay | Admitting: Physician Assistant

## 2021-12-05 ENCOUNTER — Other Ambulatory Visit: Payer: Self-pay | Admitting: Nurse Practitioner

## 2021-12-05 DIAGNOSIS — D7581 Myelofibrosis: Secondary | ICD-10-CM

## 2021-12-05 MED ORDER — HYDROCODONE-ACETAMINOPHEN 5-325 MG PO TABS: 1.0000 | ORAL_TABLET | ORAL | 0 refills | Status: DC | PRN

## 2021-12-05 NOTE — Telephone Encounter (Signed)
Request given to provider

## 2021-12-13 ENCOUNTER — Other Ambulatory Visit: Payer: Self-pay | Admitting: Oncology

## 2021-12-16 ENCOUNTER — Telehealth: Payer: Self-pay | Admitting: Family Medicine

## 2021-12-16 NOTE — Telephone Encounter (Signed)
Pt is needing a refill for her meds.  ALPRAZolam (XANAX) 0.5 MG tablet [[id number]]

## 2021-12-16 NOTE — Telephone Encounter (Signed)
I went to fill this but looks like oncology filled today?

## 2021-12-17 NOTE — Telephone Encounter (Signed)
Spoke with pt and she has stated that her oncology office has in fact filled her rx for ALPRAZolam (XANAX) 0.5 MG tablet .

## 2021-12-23 ENCOUNTER — Inpatient Hospital Stay: Payer: BC Managed Care – PPO | Attending: Oncology

## 2021-12-23 ENCOUNTER — Other Ambulatory Visit: Payer: Self-pay

## 2021-12-23 DIAGNOSIS — D7581 Myelofibrosis: Secondary | ICD-10-CM | POA: Insufficient documentation

## 2021-12-23 DIAGNOSIS — D649 Anemia, unspecified: Secondary | ICD-10-CM | POA: Insufficient documentation

## 2021-12-23 LAB — CBC WITH DIFFERENTIAL (CANCER CENTER ONLY)
Abs Immature Granulocytes: 0.75 10*3/uL — ABNORMAL HIGH (ref 0.00–0.07)
Basophils Absolute: 0 10*3/uL (ref 0.0–0.1)
Basophils Relative: 1 %
Eosinophils Absolute: 0 10*3/uL (ref 0.0–0.5)
Eosinophils Relative: 0 %
HCT: 29.8 % — ABNORMAL LOW (ref 36.0–46.0)
Hemoglobin: 9.2 g/dL — ABNORMAL LOW (ref 12.0–15.0)
Immature Granulocytes: 16 %
Lymphocytes Relative: 13 %
Lymphs Abs: 0.6 10*3/uL — ABNORMAL LOW (ref 0.7–4.0)
MCH: 28.1 pg (ref 26.0–34.0)
MCHC: 30.9 g/dL (ref 30.0–36.0)
MCV: 91.1 fL (ref 80.0–100.0)
Monocytes Absolute: 0.4 10*3/uL (ref 0.1–1.0)
Monocytes Relative: 8 %
Neutro Abs: 3 10*3/uL (ref 1.7–7.7)
Neutrophils Relative %: 62 %
Platelet Count: 162 10*3/uL (ref 150–400)
RBC: 3.27 MIL/uL — ABNORMAL LOW (ref 3.87–5.11)
RDW: 17.7 % — ABNORMAL HIGH (ref 11.5–15.5)
WBC Count: 4.8 10*3/uL (ref 4.0–10.5)
nRBC: 2.1 % — ABNORMAL HIGH (ref 0.0–0.2)

## 2021-12-24 ENCOUNTER — Other Ambulatory Visit (HOSPITAL_COMMUNITY): Payer: Self-pay

## 2021-12-26 ENCOUNTER — Other Ambulatory Visit: Payer: Self-pay | Admitting: Nurse Practitioner

## 2021-12-26 ENCOUNTER — Other Ambulatory Visit (HOSPITAL_COMMUNITY): Payer: Self-pay

## 2021-12-26 ENCOUNTER — Other Ambulatory Visit: Payer: Self-pay | Admitting: Oncology

## 2021-12-26 DIAGNOSIS — D7581 Myelofibrosis: Secondary | ICD-10-CM

## 2021-12-29 ENCOUNTER — Other Ambulatory Visit: Payer: Self-pay | Admitting: Nurse Practitioner

## 2021-12-29 ENCOUNTER — Other Ambulatory Visit (HOSPITAL_COMMUNITY): Payer: Self-pay

## 2021-12-29 DIAGNOSIS — D7581 Myelofibrosis: Secondary | ICD-10-CM

## 2021-12-29 MED ORDER — RUXOLITINIB PHOSPHATE 15 MG PO TABS
ORAL_TABLET | ORAL | 2 refills | Status: DC
  Filled 2021-12-29: qty 45, 30d supply, fill #0
  Filled 2022-02-05: qty 45, 30d supply, fill #1
  Filled 2022-02-26: qty 45, 30d supply, fill #2

## 2021-12-29 MED ORDER — HYDROCODONE-ACETAMINOPHEN 5-325 MG PO TABS: 1.0000 | ORAL_TABLET | ORAL | 0 refills | Status: DC | PRN

## 2021-12-30 ENCOUNTER — Telehealth: Payer: Self-pay

## 2021-12-30 ENCOUNTER — Other Ambulatory Visit: Payer: Self-pay | Admitting: Nurse Practitioner

## 2021-12-30 ENCOUNTER — Other Ambulatory Visit (HOSPITAL_BASED_OUTPATIENT_CLINIC_OR_DEPARTMENT_OTHER): Payer: Self-pay

## 2021-12-30 DIAGNOSIS — D7581 Myelofibrosis: Secondary | ICD-10-CM

## 2021-12-30 MED ORDER — HYDROCODONE-ACETAMINOPHEN 5-325 MG PO TABS
1.0000 | ORAL_TABLET | ORAL | 0 refills | Status: DC | PRN
  Filled 2021-12-30: qty 75, 13d supply, fill #0

## 2021-12-30 NOTE — Telephone Encounter (Signed)
Called and spoke with the patient to let her know Norco was refill and send to [facility]

## 2021-12-30 NOTE — Telephone Encounter (Signed)
Patient called and left a voicemail stated that the [facility] is out of 5-325 mg Norco and the pharmacy only have 7.5-325 mg Norco. [facility] also [facility] to [facility] the prescription of the 5/325 mg Norco.  Called and spoke with the patient about her Norco. I let the patient know Ned Card will be calling in the new [facility]co prescription  at [facility].

## 2022-01-13 ENCOUNTER — Other Ambulatory Visit: Payer: Self-pay | Admitting: Nurse Practitioner

## 2022-01-13 ENCOUNTER — Other Ambulatory Visit (HOSPITAL_BASED_OUTPATIENT_CLINIC_OR_DEPARTMENT_OTHER): Payer: Self-pay

## 2022-01-13 DIAGNOSIS — D7581 Myelofibrosis: Secondary | ICD-10-CM

## 2022-01-13 MED ORDER — HYDROCODONE-ACETAMINOPHEN 5-325 MG PO TABS
1.0000 | ORAL_TABLET | ORAL | 0 refills | Status: DC | PRN
  Filled 2022-01-13: qty 75, 13d supply, fill #0

## 2022-01-21 ENCOUNTER — Other Ambulatory Visit (HOSPITAL_COMMUNITY): Payer: Self-pay

## 2022-01-23 ENCOUNTER — Other Ambulatory Visit (HOSPITAL_COMMUNITY): Payer: Self-pay

## 2022-01-26 ENCOUNTER — Other Ambulatory Visit (HOSPITAL_COMMUNITY): Payer: Self-pay

## 2022-01-30 ENCOUNTER — Other Ambulatory Visit: Payer: Self-pay | Admitting: Nurse Practitioner

## 2022-01-30 ENCOUNTER — Other Ambulatory Visit (HOSPITAL_BASED_OUTPATIENT_CLINIC_OR_DEPARTMENT_OTHER): Payer: Self-pay

## 2022-01-30 DIAGNOSIS — D7581 Myelofibrosis: Secondary | ICD-10-CM

## 2022-02-02 ENCOUNTER — Other Ambulatory Visit: Payer: Self-pay | Admitting: Nurse Practitioner

## 2022-02-02 DIAGNOSIS — D7581 Myelofibrosis: Secondary | ICD-10-CM

## 2022-02-02 MED ORDER — HYDROCODONE-ACETAMINOPHEN 5-325 MG PO TABS: 1.0000 | ORAL_TABLET | ORAL | 0 refills | Status: DC | PRN

## 2022-02-04 ENCOUNTER — Other Ambulatory Visit: Payer: Self-pay | Admitting: Nurse Practitioner

## 2022-02-04 DIAGNOSIS — D7581 Myelofibrosis: Secondary | ICD-10-CM

## 2022-02-05 ENCOUNTER — Other Ambulatory Visit: Payer: Self-pay | Admitting: Nurse Practitioner

## 2022-02-05 ENCOUNTER — Other Ambulatory Visit (HOSPITAL_COMMUNITY): Payer: Self-pay

## 2022-02-05 ENCOUNTER — Other Ambulatory Visit (HOSPITAL_BASED_OUTPATIENT_CLINIC_OR_DEPARTMENT_OTHER): Payer: Self-pay

## 2022-02-05 ENCOUNTER — Other Ambulatory Visit: Payer: Self-pay | Admitting: Oncology

## 2022-02-05 MED ORDER — HYDROCODONE-ACETAMINOPHEN 5-325 MG PO TABS
1.0000 | ORAL_TABLET | ORAL | 0 refills | Status: DC | PRN
  Filled 2022-02-05 – 2022-02-19 (×2): qty 75, 13d supply, fill #0

## 2022-02-05 MED ORDER — ALPRAZOLAM 0.5 MG PO TABS
ORAL_TABLET | ORAL | 1 refills | Status: DC
  Filled 2022-02-05: qty 60, fill #0
  Filled 2022-02-09: qty 60, 30d supply, fill #0
  Filled 2022-02-26: qty 60, 30d supply, fill #1

## 2022-02-08 ENCOUNTER — Other Ambulatory Visit: Payer: Self-pay | Admitting: Oncology

## 2022-02-08 DIAGNOSIS — D7581 Myelofibrosis: Secondary | ICD-10-CM

## 2022-02-09 ENCOUNTER — Other Ambulatory Visit (HOSPITAL_BASED_OUTPATIENT_CLINIC_OR_DEPARTMENT_OTHER): Payer: Self-pay

## 2022-02-12 DIAGNOSIS — Z1231 Encounter for screening mammogram for malignant neoplasm of breast: Secondary | ICD-10-CM | POA: Diagnosis not present

## 2022-02-19 ENCOUNTER — Other Ambulatory Visit (HOSPITAL_BASED_OUTPATIENT_CLINIC_OR_DEPARTMENT_OTHER): Payer: Self-pay

## 2022-02-26 ENCOUNTER — Other Ambulatory Visit: Payer: Self-pay | Admitting: Oncology

## 2022-02-26 ENCOUNTER — Other Ambulatory Visit (HOSPITAL_COMMUNITY): Payer: Self-pay

## 2022-02-26 ENCOUNTER — Other Ambulatory Visit (HOSPITAL_BASED_OUTPATIENT_CLINIC_OR_DEPARTMENT_OTHER): Payer: Self-pay

## 2022-02-26 DIAGNOSIS — D7581 Myelofibrosis: Secondary | ICD-10-CM

## 2022-02-27 ENCOUNTER — Other Ambulatory Visit (HOSPITAL_BASED_OUTPATIENT_CLINIC_OR_DEPARTMENT_OTHER): Payer: Self-pay

## 2022-02-27 ENCOUNTER — Other Ambulatory Visit: Payer: Self-pay | Admitting: *Deleted

## 2022-02-27 NOTE — Telephone Encounter (Signed)
[facility] [place]. Last filled at [facility] on 3/20 and has an additional refill. Called [facility] to cancel refill there. ?

## 2022-02-27 NOTE — Telephone Encounter (Signed)
Last filled at [facility] on 02/09/22--will refill on 02/03/22  ?

## 2022-03-04 ENCOUNTER — Other Ambulatory Visit (HOSPITAL_COMMUNITY): Payer: Self-pay

## 2022-03-10 ENCOUNTER — Other Ambulatory Visit (HOSPITAL_BASED_OUTPATIENT_CLINIC_OR_DEPARTMENT_OTHER): Payer: Self-pay

## 2022-03-10 ENCOUNTER — Other Ambulatory Visit: Payer: Self-pay | Admitting: Nurse Practitioner

## 2022-03-10 ENCOUNTER — Other Ambulatory Visit: Payer: Self-pay | Admitting: Oncology

## 2022-03-10 DIAGNOSIS — D7581 Myelofibrosis: Secondary | ICD-10-CM

## 2022-03-10 MED ORDER — HYDROCODONE-ACETAMINOPHEN 5-325 MG PO TABS
1.0000 | ORAL_TABLET | ORAL | 0 refills | Status: DC | PRN
  Filled 2022-03-10: qty 75, 13d supply, fill #0

## 2022-03-13 ENCOUNTER — Other Ambulatory Visit: Payer: Self-pay | Admitting: Nurse Practitioner

## 2022-03-13 ENCOUNTER — Telehealth: Payer: Self-pay

## 2022-03-13 DIAGNOSIS — D7581 Myelofibrosis: Secondary | ICD-10-CM

## 2022-03-13 NOTE — Telephone Encounter (Signed)
Called [name] to let her know its too early to refill her Xanax. Patient gave verbal understanding ?

## 2022-03-23 ENCOUNTER — Inpatient Hospital Stay: Payer: BC Managed Care – PPO

## 2022-03-23 ENCOUNTER — Inpatient Hospital Stay: Payer: BC Managed Care – PPO | Admitting: Oncology

## 2022-03-24 ENCOUNTER — Other Ambulatory Visit (HOSPITAL_COMMUNITY): Payer: Self-pay

## 2022-03-26 ENCOUNTER — Other Ambulatory Visit (HOSPITAL_COMMUNITY): Payer: Self-pay

## 2022-03-29 ENCOUNTER — Other Ambulatory Visit: Payer: Self-pay | Admitting: Nurse Practitioner

## 2022-03-29 DIAGNOSIS — D7581 Myelofibrosis: Secondary | ICD-10-CM

## 2022-03-30 ENCOUNTER — Other Ambulatory Visit: Payer: Self-pay | Admitting: Oncology

## 2022-03-30 ENCOUNTER — Other Ambulatory Visit (HOSPITAL_BASED_OUTPATIENT_CLINIC_OR_DEPARTMENT_OTHER): Payer: Self-pay

## 2022-03-30 ENCOUNTER — Other Ambulatory Visit (HOSPITAL_COMMUNITY): Payer: Self-pay

## 2022-03-30 ENCOUNTER — Other Ambulatory Visit: Payer: Self-pay | Admitting: Nurse Practitioner

## 2022-03-30 DIAGNOSIS — D7581 Myelofibrosis: Secondary | ICD-10-CM

## 2022-03-30 MED ORDER — RUXOLITINIB PHOSPHATE 15 MG PO TABS
ORAL_TABLET | ORAL | 2 refills | Status: DC
  Filled 2022-03-30: qty 45, 30d supply, fill #0
  Filled 2022-04-17: qty 45, 30d supply, fill #1
  Filled 2022-05-20: qty 45, 30d supply, fill #2

## 2022-03-30 MED ORDER — HYDROCODONE-ACETAMINOPHEN 5-325 MG PO TABS
1.0000 | ORAL_TABLET | ORAL | 0 refills | Status: DC | PRN
  Filled 2022-03-30: qty 75, 13d supply, fill #0

## 2022-04-02 ENCOUNTER — Other Ambulatory Visit (HOSPITAL_COMMUNITY): Payer: Self-pay

## 2022-04-06 ENCOUNTER — Other Ambulatory Visit: Payer: Self-pay | Admitting: Oncology

## 2022-04-06 DIAGNOSIS — D7581 Myelofibrosis: Secondary | ICD-10-CM

## 2022-04-07 ENCOUNTER — Other Ambulatory Visit: Payer: Self-pay | Admitting: Oncology

## 2022-04-07 ENCOUNTER — Other Ambulatory Visit: Payer: Self-pay | Admitting: Nurse Practitioner

## 2022-04-07 DIAGNOSIS — D7581 Myelofibrosis: Secondary | ICD-10-CM

## 2022-04-07 MED ORDER — ALPRAZOLAM 0.5 MG PO TABS: ORAL_TABLET | ORAL | 0 refills | Status: DC

## 2022-04-15 ENCOUNTER — Other Ambulatory Visit: Payer: Self-pay | Admitting: Nurse Practitioner

## 2022-04-15 DIAGNOSIS — D7581 Myelofibrosis: Secondary | ICD-10-CM

## 2022-04-17 ENCOUNTER — Other Ambulatory Visit (HOSPITAL_COMMUNITY): Payer: Self-pay

## 2022-04-17 ENCOUNTER — Other Ambulatory Visit (HOSPITAL_BASED_OUTPATIENT_CLINIC_OR_DEPARTMENT_OTHER): Payer: Self-pay

## 2022-04-17 ENCOUNTER — Other Ambulatory Visit: Payer: Self-pay | Admitting: Nurse Practitioner

## 2022-04-17 DIAGNOSIS — D7581 Myelofibrosis: Secondary | ICD-10-CM

## 2022-04-17 MED ORDER — HYDROCODONE-ACETAMINOPHEN 5-325 MG PO TABS: 1.0000 | ORAL_TABLET | ORAL | 0 refills | Status: DC | PRN

## 2022-04-28 ENCOUNTER — Other Ambulatory Visit (HOSPITAL_COMMUNITY): Payer: Self-pay

## 2022-04-29 ENCOUNTER — Other Ambulatory Visit (HOSPITAL_COMMUNITY): Payer: Self-pay

## 2022-05-01 ENCOUNTER — Other Ambulatory Visit: Payer: Self-pay

## 2022-05-01 ENCOUNTER — Inpatient Hospital Stay (HOSPITAL_BASED_OUTPATIENT_CLINIC_OR_DEPARTMENT_OTHER): Payer: BC Managed Care – PPO | Admitting: Oncology

## 2022-05-01 ENCOUNTER — Inpatient Hospital Stay: Payer: BC Managed Care – PPO | Attending: Oncology

## 2022-05-01 VITALS — BP 167/78 | HR 72 | Temp 98.1°F | Resp 18 | Ht 69.0 in | Wt 163.4 lb

## 2022-05-01 DIAGNOSIS — D649 Anemia, unspecified: Secondary | ICD-10-CM | POA: Diagnosis not present

## 2022-05-01 DIAGNOSIS — C50912 Malignant neoplasm of unspecified site of left female breast: Secondary | ICD-10-CM

## 2022-05-01 DIAGNOSIS — D7581 Myelofibrosis: Secondary | ICD-10-CM | POA: Insufficient documentation

## 2022-05-01 DIAGNOSIS — Z853 Personal history of malignant neoplasm of breast: Secondary | ICD-10-CM | POA: Diagnosis not present

## 2022-05-01 LAB — CMP (CANCER CENTER ONLY)
ALT: 12 U/L (ref 0–44)
AST: 15 U/L (ref 15–41)
Albumin: 4.6 g/dL (ref 3.5–5.0)
Alkaline Phosphatase: 52 U/L (ref 38–126)
Anion gap: 8 (ref 5–15)
BUN: 16 mg/dL (ref 6–20)
CO2: 24 mmol/L (ref 22–32)
Calcium: 9.7 mg/dL (ref 8.9–10.3)
Chloride: 105 mmol/L (ref 98–111)
Creatinine: 0.96 mg/dL (ref 0.44–1.00)
GFR, Estimated: >60 mL/min (ref >60)
Glucose, Bld: 120 mg/dL — ABNORMAL HIGH (ref 70–99)
Potassium: 3.9 mmol/L (ref 3.5–5.1)
Sodium: 137 mmol/L (ref 135–145)
Total Bilirubin: 0.5 mg/dL (ref 0.3–1.2)
Total Protein: 7.2 g/dL (ref 6.5–8.1)

## 2022-05-01 LAB — CBC WITH DIFFERENTIAL (CANCER CENTER ONLY)
Abs Immature Granulocytes: 0.7 10*3/uL — ABNORMAL HIGH (ref 0.00–0.07)
Band Neutrophils: 17 %
Basophils Absolute: 0 10*3/uL (ref 0.0–0.1)
Basophils Relative: 0 %
Eosinophils Absolute: 0 10*3/uL (ref 0.0–0.5)
Eosinophils Relative: 0 %
HCT: 33.2 % — ABNORMAL LOW (ref 36.0–46.0)
Hemoglobin: 10.2 g/dL — ABNORMAL LOW (ref 12.0–15.0)
Lymphocytes Relative: 21 %
Lymphs Abs: 1.8 10*3/uL (ref 0.7–4.0)
MCH: 27.4 pg (ref 26.0–34.0)
MCHC: 30.7 g/dL (ref 30.0–36.0)
MCV: 89.2 fL (ref 80.0–100.0)
Metamyelocytes Relative: 3 %
Monocytes Absolute: 0.8 10*3/uL (ref 0.1–1.0)
Monocytes Relative: 9 %
Myelocytes: 5 %
Neutro Abs: 5.3 10*3/uL (ref 1.7–7.7)
Neutrophils Relative %: 45 %
Platelet Count: 165 10*3/uL (ref 150–400)
RBC: 3.72 MIL/uL — ABNORMAL LOW (ref 3.87–5.11)
RDW: 17 % — ABNORMAL HIGH (ref 11.5–15.5)
WBC Count: 8.5 10*3/uL (ref 4.0–10.5)
nRBC: 1.1 % — ABNORMAL HIGH (ref 0.0–0.2)

## 2022-05-01 NOTE — Progress Notes (Signed)
[place] OFFICE PROGRESS NOTE   Diagnosis: Myelofibrosis  INTERVAL HISTORY:   [name] returns as scheduled.  She continues ruxolitinib.  Sweats remain improved.  She has early satiety related to splenomegaly.  She relates weight loss to the recent death of her mother and illness of her father.  She takes ibuprofen and hydrocodone as needed for bone pain.  No rash or diarrhea. A right mammogram 02/12/2022 was negative. Objective:  Vital signs in last 24 hours:  Blood pressure (!) 167/78, pulse 72, temperature 98.1 F (36.7 C), temperature source Oral, resp. rate 18, height 5' 9"  (1.753 m), weight 163 lb 6.4 oz (74.1 kg), SpO2 100 %.    Lymphatics: No axillary nodes Resp: Lungs clear bilaterally Cardio: Regular rate and rhythm GI: The spleen is palpable throughout the left abdomen extending to the right of the midline and a few fingers below the umbilicus, no hepatomegaly Vascular: No leg edema Breast: Status post left mastectomy with an plan in place.  No evidence for chest wall tumor recurrence.  Right breast without mass.   Lab Results:  Lab Results  Component Value Date   WBC 8.5 05/01/2022   HGB 10.2 (L) 05/01/2022   HCT 33.2 (L) 05/01/2022   MCV 89.2 05/01/2022   PLT 165 05/01/2022   NEUTROABS PENDING 05/01/2022    CMP  Lab Results  Component Value Date   NA 137 05/01/2022   K 3.9 05/01/2022   CL 105 05/01/2022   CO2 24 05/01/2022   GLUCOSE 120 (H) 05/01/2022   BUN 16 05/01/2022   CREATININE 0.96 05/01/2022   CALCIUM 9.7 05/01/2022   PROT 7.2 05/01/2022   ALBUMIN 4.6 05/01/2022   AST 15 05/01/2022   ALT 12 05/01/2022   ALKPHOS 52 05/01/2022   BILITOT 0.5 05/01/2022   GFRNONAA >60 05/01/2022   GFRAA 59 (L) 06/12/2020     Medications: I have reviewed the patient's current medications.   Assessment/Plan: Stage II left-sided breast cancer diagnosed in February 2000. Screening mammogram 10/05/2011 with suggestion of a subcentimeter  oval mass on the left superiorly and no suspicious masses, calcifications or architectural distortion in the right breast. She underwent biopsy of the "asymmetric density of concern in the superior aspect of the right breast" on 10/13/2011. Pathology showed a fibroadenoma.   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 History of intermittent low-grade fever and "night sweats," likely systemic manifestations of myelofibrosis. History of Nose bleeding-likely related to myelofibrosis with dysfunctional platelets 7. 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   8. Elevated creatinine July 2015-normal 08/06/2014, potentially related to blood pressure medication   9.  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  Increased low back pain July 2020 MRI lumbar spine 06/26/2019- diffuse T1 signal compatible with myelofibrosis, hyperintense T2 signal at T12 and L3, most likely hemangiomas Bone scan 07/04/2019- diffuse increased and homogenous uptake  compatible with myelofibrosis, no focal increased activity at T12 or L3 11.  Exertional dyspnea-etiology unclear- evaluated by cardiology    Disposition: [name] is stable from a hematologic standpoint.  She will continue ruxolitinib  at current dose.  She will return for a lab visit in 3 months and an office visit in 6 months.  Angela Coder, MD  05/01/2022  12:13 PM

## 2022-05-04 ENCOUNTER — Other Ambulatory Visit: Payer: Self-pay | Admitting: Family Medicine

## 2022-05-04 ENCOUNTER — Other Ambulatory Visit: Payer: Self-pay | Admitting: Nurse Practitioner

## 2022-05-04 DIAGNOSIS — D7581 Myelofibrosis: Secondary | ICD-10-CM

## 2022-05-06 MED ORDER — ALPRAZOLAM 0.5 MG PO TABS: ORAL_TABLET | ORAL | 0 refills | Status: DC

## 2022-05-07 ENCOUNTER — Other Ambulatory Visit: Payer: Self-pay | Admitting: Nurse Practitioner

## 2022-05-07 DIAGNOSIS — D7581 Myelofibrosis: Secondary | ICD-10-CM

## 2022-05-08 ENCOUNTER — Other Ambulatory Visit (HOSPITAL_BASED_OUTPATIENT_CLINIC_OR_DEPARTMENT_OTHER): Payer: Self-pay

## 2022-05-08 ENCOUNTER — Ambulatory Visit (INDEPENDENT_AMBULATORY_CARE_PROVIDER_SITE_OTHER): Payer: BC Managed Care – PPO | Admitting: Family Medicine

## 2022-05-08 ENCOUNTER — Other Ambulatory Visit: Payer: Self-pay | Admitting: Nurse Practitioner

## 2022-05-08 ENCOUNTER — Encounter: Payer: Self-pay | Admitting: Family Medicine

## 2022-05-08 VITALS — BP 136/68 | HR 73 | Temp 98.2°F | Ht 67.0 in | Wt 165.0 lb

## 2022-05-08 DIAGNOSIS — F411 Generalized anxiety disorder: Secondary | ICD-10-CM | POA: Diagnosis not present

## 2022-05-08 DIAGNOSIS — D7581 Myelofibrosis: Secondary | ICD-10-CM

## 2022-05-08 DIAGNOSIS — E785 Hyperlipidemia, unspecified: Secondary | ICD-10-CM

## 2022-05-08 DIAGNOSIS — Z0000 Encounter for general adult medical examination without abnormal findings: Secondary | ICD-10-CM

## 2022-05-08 DIAGNOSIS — G610 Guillain-Barre syndrome: Secondary | ICD-10-CM | POA: Insufficient documentation

## 2022-05-08 MED ORDER — HYDROCODONE-ACETAMINOPHEN 5-325 MG PO TABS
1.0000 | ORAL_TABLET | ORAL | 0 refills | Status: DC | PRN
  Filled 2022-05-08: qty 75, 13d supply, fill #0

## 2022-05-08 MED ORDER — SERTRALINE HCL 100 MG PO TABS: 100.0000 mg | ORAL_TABLET | Freq: Every day | ORAL | 5 refills | Status: DC

## 2022-05-08 NOTE — Progress Notes (Signed)
Phone [phone]   Subjective:  Patient presents today for their annual physical. Chief complaint-noted.   See problem oriented charting- Review of Systems  Constitutional:  Negative for chills and fever.  HENT:  Negative for congestion and sinus pain.   Eyes:  Negative for blurred vision and double vision.  Respiratory:  Negative for cough and shortness of breath.   Cardiovascular:  Negative for chest pain and palpitations.  Gastrointestinal:  Negative for blood in stool, heartburn, nausea and vomiting.  Genitourinary:  Negative for dysuria and frequency.  Musculoskeletal:  Negative for back pain, myalgias and neck pain.       Bony pain from myelofibrosis   Skin:  Negative for itching and rash.  Neurological:  Positive for headaches (some stress related). Negative for dizziness.  Endo/Heme/Allergies:  Negative for polydipsia. Does not bruise/bleed easily.  Psychiatric/Behavioral:  Negative for depression and suicidal ideas. The patient is nervous/anxious.    The following were reviewed and entered/updated in epic: Past Medical History:  Diagnosis Date   Breast cancer ([place])    Myelofibrosis ([place])    Patient Active Problem List   Diagnosis Date Noted   History of Guillain-Barre syndrome 10/28/2021    Priority: High   Myelofibrosis ([place]) 02/23/2008    Priority: High   History of breast cancer     Priority: Medium    Past Surgical History:  Procedure Laterality Date   ABDOMINAL HYSTERECTOMY     BREAST SURGERY     for cancer    Family History  Problem Relation Age of Onset   Other Mother        NASH lead to hospice   Atrial fibrillation Mother    Diabetes Mother    Hypertension Mother    Pancreatic cancer Father        73- diagnosed with mets in 2023   Healthy Brother    Lung cancer Maternal Grandmother        dx in her 11x - smoker   Other Paternal Grandmother        unrecalled cause of death   Other Paternal Grandfather        unrecalled cause of death    Breast cancer Maternal Aunt        dx in her 49s   Lung cancer Maternal Aunt        dx in her 69s - smoker   Leukemia Cousin 4       maternal first cousin - child of aunt with breast cancer   Breast cancer Cousin        dx in her 10s - daughter of aunt with lung cancer   Colon cancer Cousin        paternal first cousin dx in his 56s   Breast cancer Other        MGM's sister dx in her 48s   Breast cancer Other        mother's maternal first cousin dx in her 62s - BRCA neg   Prostate cancer Other        MGM's brother    Medications- reviewed and updated Current Outpatient Medications  Medication Sig Dispense Refill   ALPRAZolam (XANAX) 0.5 MG tablet TAKE 1 TABLET BY MOUTH TWICE A DAY AS NEEDED 60 tablet 0   HYDROcodone-acetaminophen (NORCO/VICODIN) 5-325 MG tablet Take 1 tablet by mouth every 4 (four) hours as needed. 75 tablet 0   ibuprofen (ADVIL) 800 MG tablet Take 1 tablet (800 mg total) by mouth daily as  needed (minimize use if possible- increases stomach bleeding risk, heart risk, and kidney function issues). for pain 60 tablet 5   ruxolitinib phosphate (JAKAFI) 15 MG tablet TAKE 15 MG (1 TABLET) BY MOUTH ALTERNATING WITH 30 MG (2 TABLETS) DAILY 45 tablet 2   sertraline (ZOLOFT) 100 MG tablet Take 1 tablet (100 mg total) by mouth daily. 30 tablet 5   zolpidem (AMBIEN) 10 MG tablet TAKE 1 TABLET BY MOUTH AT BEDTIME AS NEEDED FOR SLEEP (TRIAL HALF TABLET IF POSSIBLE. DO NOT TAKE THIS WITHIN 8 HOURS OF ALPRAZOLAM OR DIRVE FOR 8 HOURS AFTER TAKING). FOR INSOMNIA 30 tablet 5   No current facility-administered medications for this visit.    Allergies-reviewed and updated Allergies  Allergen Reactions   Clarithromycin Rash    Social History   Social History Narrative   Married. 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 Oropharynx normal Neck: no thyromegaly CV: RRR no murmurs rubs or gallops Lungs: CTAB no crackles, wheeze, rhonchi Abdomen: soft/nontender/nondistended/normal bowel sounds. No rebound or guarding. Splenomegaly noted Ext: no edema Skin: warm, dry Neuro: grossly normal, moves all extremities, PERRLA   Assessment and Plan   56 y.o. female presenting for annual physical.  Health Maintenance counseling: 1. Anticipatory guidance: Patient counseled regarding regular dental exams -q6 months, eye exams - no vision issues- encouraged to consider baseline,  avoiding smoking and second hand smoke , limiting alcohol to 1 beverage per day , no illicit drugs .   2. Risk factor reduction:  Advised patient of need for regular exercise and diet rich and fruits and vegetables to reduce risk of heart attack and stroke.  Exercise- tough with her pain and with stressors.  Diet/weight management-weight within 4 lbs of last visit- trying to eat reasonably healthy.  Wt Readings from Last 3 Encounters:  05/08/22 165 lb (74.8 kg)  05/01/22 163 lb 6.4 oz (74.1 kg)  10/28/21 169 lb 12.8 oz (77 kg)  3. Immunizations/screenings/ancillary studies-outside both vaccinations with GBS history-influenza certainly contraindicated . Opts out shingrix.  Immunization History  Administered Date(s) Administered   PFIZER(Purple Top)SARS-COV-2 Vaccination 02/17/2020, 03/13/2020   Tdap 11/20/2019  4. Cervical cancer screening- Sign release of information at the check out desk for last pap smear with Dr. Nori Riis- she sees soon and will be with ne provider 5. Breast cancer history in 2000-required chemotherapy and surgery and follows with Dr. Benay Spice-  breast exam - Dr. Benay Spice plus  Dr. Verlon Au office- - and mammogram 02/12/22 6. Colon cancer screening - 2017 with unknown provider-she was going to research so we can request records- knows 5 year follow up recommended- we will give her info to call [facility] back 7. Skin cancer screening- does not see dermatology. advised regular sunscreen use. Denies worrisome, changing, or new skin lesions.  8. Birth control/STD check- post menopause and only active with husband 39. Osteoporosis screening at 5- she wants to discuss with DR. Verlon Au office at next visit-  offered today 10. Smoking associated screening - Never smoker  Status of chronic or acute concerns   #social update- lost mother in April (had been on hospice). Dad with pancreatic cancer- already with mets  #Myelofibrosis-follows with Dr. Benay Spice from oncology S: Diagnosed in 2009-primary not related to breast cancer.  She remains on JAKafi 15 mg and 30 mg alternating days.  - ongoing chronic pain/aching sensation and is on long term hydrocodone and ibuprofen 885m A/P:  overall stable- continue current meds- need to monitor renal function on ibuprofen - I would be willing to prescribe hydrocodone if needed but would need guidance from oncology as have not prescribed for this indication in past -warned of risks with alprazolam, ambien, hydrocodone- any within 8 hours of the other -stable anemia with myelofibrosis within a week -thankfully kidneys tolerating ibuprofen  #Anxiety/hot flashes S: Medication: Escitalopram 20 mg recently started by GYN for hot flashes-could not use hormone replacement therapy.  Has alprazolam available as needed for mental stress related to physical ailments with history of 2 separate types of cancer - Last visit patient's mother was ill and on hospice- she passed in April - discussed potential gi bleeding risk with all SSRI A/P: anxiety/stress worse- she would like to trial alternate rx- sent in sertraline 1051mto take in place of  escitalopram  and follow up 6 weeks- if worsening symptoms or thoughts of self harm- she agrees to call [phone] or call [place] continue xanax and discussed once again spacing doses as above by 8 hours from amAzerbaijannd hydrocodone   #Insomnia S: Ambien 10 mg as needed perhaps every other night but worse if has multiple stressors A/P: Reasonable control-we have discussed max ideal dose will be 5 mg and would certainly try to reduce at age [age]f unable to reduce prior to that time  #hyperlipidemia S: Medication: None-ASCVD risk only 4.2% Lab Results  Component Value Date   CHOL 147 12/30/2020   HDL 29 (L) 12/30/2020   LDLCALC 63 12/30/2020   LDLDIRECT 77.1 09/26/2020   TRIG 276 (H) 12/30/2020   CHOLHDL 5.1 12/30/2020   A/P: Mild elevations-focus on healthy eating/regular exercise as tolerable-recheck lipids with next labs- just had labs last week and wants to hold off   #History of Guillain Barre syndrome 1994 related to flu shot-symptoms up to her waist and required IVIG   Recommended follow up: Return in about 6 weeks (around 06/19/2022) for followup or sooner if needed.Schedule b4 you leave. Future Appointments  Date Time Provider [place]9/06/2022 11:30 AM [facility] PHLEBOTOMIST [facility] None  10/30/2022 11:30 AM [facility] PHLEBOTOMIST [facility] None  10/30/2022 12:00 PM ShLadell PierMD [facility] None   Lab/Order associations:NOT fasting   ICD-10-CM   1. Preventative health care  Z00.00     2. Mild hyperlipidemia  E78.5       Meds ordered this encounter  Medications   sertraline (ZOLOFT) 100 MG tablet    Sig: Take 1 tablet (100 mg total) by mouth daily.    Dispense:  30 tablet    Refill:  5    Return precautions advised.  StGarret ReddishMD

## 2022-05-08 NOTE — Patient Instructions (Addendum)
Sign release of information at the check out desk for last pap smear with Dr. [facility] contact Please call to schedule visit and/or procedure Address: [address], [place], [place] [address] Phone: [phone]  anxiety/stress worse- she would like to trial alternate rx- sent in sertraline '100mg'$  to take in place of escitalopram  and follow up 6 weeks- if worsening symptoms or thoughts of self harm- she agrees to call [phone] or call us  Could consider hospice grief counseling as well  Recommended follow up: Return in about 6 weeks (around 06/19/2022) for followup or sooner if needed.Schedule b4 you leave.

## 2022-05-20 ENCOUNTER — Other Ambulatory Visit (HOSPITAL_COMMUNITY): Payer: Self-pay

## 2022-05-27 ENCOUNTER — Other Ambulatory Visit: Payer: Self-pay | Admitting: Nurse Practitioner

## 2022-05-27 ENCOUNTER — Other Ambulatory Visit (HOSPITAL_COMMUNITY): Payer: Self-pay

## 2022-05-27 ENCOUNTER — Other Ambulatory Visit (HOSPITAL_BASED_OUTPATIENT_CLINIC_OR_DEPARTMENT_OTHER): Payer: Self-pay

## 2022-05-27 DIAGNOSIS — D7581 Myelofibrosis: Secondary | ICD-10-CM

## 2022-05-27 MED ORDER — HYDROCODONE-ACETAMINOPHEN 5-325 MG PO TABS
1.0000 | ORAL_TABLET | ORAL | 0 refills | Status: DC | PRN
  Filled 2022-05-27: qty 75, 13d supply, fill #0

## 2022-05-28 ENCOUNTER — Other Ambulatory Visit (HOSPITAL_BASED_OUTPATIENT_CLINIC_OR_DEPARTMENT_OTHER): Payer: Self-pay

## 2022-05-29 ENCOUNTER — Other Ambulatory Visit (HOSPITAL_COMMUNITY): Payer: Self-pay

## 2022-05-31 ENCOUNTER — Other Ambulatory Visit: Payer: Self-pay | Admitting: Family Medicine

## 2022-06-03 ENCOUNTER — Other Ambulatory Visit: Payer: Self-pay

## 2022-06-03 ENCOUNTER — Other Ambulatory Visit: Payer: Self-pay | Admitting: Oncology

## 2022-06-03 DIAGNOSIS — D7581 Myelofibrosis: Secondary | ICD-10-CM

## 2022-06-03 MED ORDER — ALPRAZOLAM 0.5 MG PO TABS: ORAL_TABLET | ORAL | 0 refills | Status: DC

## 2022-06-15 ENCOUNTER — Other Ambulatory Visit: Payer: Self-pay | Admitting: Nurse Practitioner

## 2022-06-15 ENCOUNTER — Other Ambulatory Visit (HOSPITAL_BASED_OUTPATIENT_CLINIC_OR_DEPARTMENT_OTHER): Payer: Self-pay

## 2022-06-15 DIAGNOSIS — D7581 Myelofibrosis: Secondary | ICD-10-CM

## 2022-06-15 MED ORDER — HYDROCODONE-ACETAMINOPHEN 5-325 MG PO TABS
1.0000 | ORAL_TABLET | ORAL | 0 refills | Status: DC | PRN
  Filled 2022-06-15: qty 75, 13d supply, fill #0

## 2022-06-17 ENCOUNTER — Other Ambulatory Visit (HOSPITAL_COMMUNITY): Payer: Self-pay

## 2022-06-19 ENCOUNTER — Other Ambulatory Visit (HOSPITAL_COMMUNITY): Payer: Self-pay

## 2022-06-19 ENCOUNTER — Ambulatory Visit: Payer: BC Managed Care – PPO | Admitting: Family Medicine

## 2022-06-22 ENCOUNTER — Other Ambulatory Visit: Payer: Self-pay | Admitting: Oncology

## 2022-06-22 ENCOUNTER — Other Ambulatory Visit (HOSPITAL_COMMUNITY): Payer: Self-pay

## 2022-06-22 DIAGNOSIS — D7581 Myelofibrosis: Secondary | ICD-10-CM

## 2022-06-24 ENCOUNTER — Other Ambulatory Visit (HOSPITAL_COMMUNITY): Payer: Self-pay

## 2022-06-24 MED ORDER — RUXOLITINIB PHOSPHATE 15 MG PO TABS
ORAL_TABLET | ORAL | 2 refills | Status: DC
  Filled 2022-06-24: qty 45, 30d supply, fill #0
  Filled 2022-07-28: qty 45, 30d supply, fill #1
  Filled 2022-09-02: qty 45, 30d supply, fill #2

## 2022-06-27 ENCOUNTER — Other Ambulatory Visit (HOSPITAL_COMMUNITY): Payer: Self-pay

## 2022-06-29 ENCOUNTER — Other Ambulatory Visit (HOSPITAL_COMMUNITY): Payer: Self-pay

## 2022-07-01 ENCOUNTER — Other Ambulatory Visit: Payer: Self-pay | Admitting: Nurse Practitioner

## 2022-07-01 DIAGNOSIS — D7581 Myelofibrosis: Secondary | ICD-10-CM

## 2022-07-02 ENCOUNTER — Other Ambulatory Visit: Payer: Self-pay | Admitting: Nurse Practitioner

## 2022-07-02 ENCOUNTER — Other Ambulatory Visit (HOSPITAL_BASED_OUTPATIENT_CLINIC_OR_DEPARTMENT_OTHER): Payer: Self-pay

## 2022-07-02 DIAGNOSIS — D7581 Myelofibrosis: Secondary | ICD-10-CM

## 2022-07-02 MED ORDER — HYDROCODONE-ACETAMINOPHEN 5-325 MG PO TABS
1.0000 | ORAL_TABLET | ORAL | 0 refills | Status: DC | PRN
  Filled 2022-07-02: qty 75, 13d supply, fill #0

## 2022-07-07 ENCOUNTER — Other Ambulatory Visit: Payer: Self-pay | Admitting: Oncology

## 2022-07-07 DIAGNOSIS — D7581 Myelofibrosis: Secondary | ICD-10-CM

## 2022-07-08 ENCOUNTER — Other Ambulatory Visit: Payer: Self-pay | Admitting: Nurse Practitioner

## 2022-07-08 ENCOUNTER — Other Ambulatory Visit (HOSPITAL_BASED_OUTPATIENT_CLINIC_OR_DEPARTMENT_OTHER): Payer: Self-pay

## 2022-07-08 ENCOUNTER — Other Ambulatory Visit: Payer: Self-pay | Admitting: Oncology

## 2022-07-08 DIAGNOSIS — D7581 Myelofibrosis: Secondary | ICD-10-CM

## 2022-07-08 MED ORDER — ALPRAZOLAM 0.5 MG PO TABS
ORAL_TABLET | ORAL | 0 refills | Status: DC
  Filled 2022-07-08: qty 60, 30d supply, fill #0

## 2022-07-13 DIAGNOSIS — M9903 Segmental and somatic dysfunction of lumbar region: Secondary | ICD-10-CM | POA: Diagnosis not present

## 2022-07-13 DIAGNOSIS — M5432 Sciatica, left side: Secondary | ICD-10-CM | POA: Diagnosis not present

## 2022-07-13 DIAGNOSIS — M9902 Segmental and somatic dysfunction of thoracic region: Secondary | ICD-10-CM | POA: Diagnosis not present

## 2022-07-13 DIAGNOSIS — M62830 Muscle spasm of back: Secondary | ICD-10-CM | POA: Diagnosis not present

## 2022-07-14 DIAGNOSIS — M9902 Segmental and somatic dysfunction of thoracic region: Secondary | ICD-10-CM | POA: Diagnosis not present

## 2022-07-14 DIAGNOSIS — M9903 Segmental and somatic dysfunction of lumbar region: Secondary | ICD-10-CM | POA: Diagnosis not present

## 2022-07-14 DIAGNOSIS — Z01419 Encounter for gynecological examination (general) (routine) without abnormal findings: Secondary | ICD-10-CM | POA: Diagnosis not present

## 2022-07-14 DIAGNOSIS — Z1272 Encounter for screening for malignant neoplasm of vagina: Secondary | ICD-10-CM | POA: Diagnosis not present

## 2022-07-14 DIAGNOSIS — Z6825 Body mass index (BMI) 25.0-25.9, adult: Secondary | ICD-10-CM | POA: Diagnosis not present

## 2022-07-14 DIAGNOSIS — M5432 Sciatica, left side: Secondary | ICD-10-CM | POA: Diagnosis not present

## 2022-07-14 DIAGNOSIS — M62830 Muscle spasm of back: Secondary | ICD-10-CM | POA: Diagnosis not present

## 2022-07-16 DIAGNOSIS — M5432 Sciatica, left side: Secondary | ICD-10-CM | POA: Diagnosis not present

## 2022-07-16 DIAGNOSIS — M9903 Segmental and somatic dysfunction of lumbar region: Secondary | ICD-10-CM | POA: Diagnosis not present

## 2022-07-16 DIAGNOSIS — M9902 Segmental and somatic dysfunction of thoracic region: Secondary | ICD-10-CM | POA: Diagnosis not present

## 2022-07-16 DIAGNOSIS — M62830 Muscle spasm of back: Secondary | ICD-10-CM | POA: Diagnosis not present

## 2022-07-19 ENCOUNTER — Other Ambulatory Visit: Payer: Self-pay | Admitting: Nurse Practitioner

## 2022-07-19 DIAGNOSIS — D7581 Myelofibrosis: Secondary | ICD-10-CM

## 2022-07-20 ENCOUNTER — Other Ambulatory Visit (HOSPITAL_BASED_OUTPATIENT_CLINIC_OR_DEPARTMENT_OTHER): Payer: Self-pay

## 2022-07-20 ENCOUNTER — Other Ambulatory Visit: Payer: Self-pay | Admitting: Nurse Practitioner

## 2022-07-20 DIAGNOSIS — D7581 Myelofibrosis: Secondary | ICD-10-CM

## 2022-07-20 MED ORDER — HYDROCODONE-ACETAMINOPHEN 5-325 MG PO TABS
1.0000 | ORAL_TABLET | ORAL | 0 refills | Status: DC | PRN
  Filled 2022-07-20: qty 75, 13d supply, fill #0

## 2022-07-21 DIAGNOSIS — M9902 Segmental and somatic dysfunction of thoracic region: Secondary | ICD-10-CM | POA: Diagnosis not present

## 2022-07-21 DIAGNOSIS — M62830 Muscle spasm of back: Secondary | ICD-10-CM | POA: Diagnosis not present

## 2022-07-21 DIAGNOSIS — M9903 Segmental and somatic dysfunction of lumbar region: Secondary | ICD-10-CM | POA: Diagnosis not present

## 2022-07-21 DIAGNOSIS — M5432 Sciatica, left side: Secondary | ICD-10-CM | POA: Diagnosis not present

## 2022-07-23 DIAGNOSIS — M9903 Segmental and somatic dysfunction of lumbar region: Secondary | ICD-10-CM | POA: Diagnosis not present

## 2022-07-23 DIAGNOSIS — M9902 Segmental and somatic dysfunction of thoracic region: Secondary | ICD-10-CM | POA: Diagnosis not present

## 2022-07-23 DIAGNOSIS — M5432 Sciatica, left side: Secondary | ICD-10-CM | POA: Diagnosis not present

## 2022-07-23 DIAGNOSIS — M62830 Muscle spasm of back: Secondary | ICD-10-CM | POA: Diagnosis not present

## 2022-07-28 ENCOUNTER — Other Ambulatory Visit (HOSPITAL_COMMUNITY): Payer: Self-pay

## 2022-07-28 DIAGNOSIS — M9903 Segmental and somatic dysfunction of lumbar region: Secondary | ICD-10-CM | POA: Diagnosis not present

## 2022-07-28 DIAGNOSIS — M5432 Sciatica, left side: Secondary | ICD-10-CM | POA: Diagnosis not present

## 2022-07-28 DIAGNOSIS — M9902 Segmental and somatic dysfunction of thoracic region: Secondary | ICD-10-CM | POA: Diagnosis not present

## 2022-07-28 DIAGNOSIS — M62830 Muscle spasm of back: Secondary | ICD-10-CM | POA: Diagnosis not present

## 2022-07-30 DIAGNOSIS — M9903 Segmental and somatic dysfunction of lumbar region: Secondary | ICD-10-CM | POA: Diagnosis not present

## 2022-07-30 DIAGNOSIS — M62830 Muscle spasm of back: Secondary | ICD-10-CM | POA: Diagnosis not present

## 2022-07-30 DIAGNOSIS — M5432 Sciatica, left side: Secondary | ICD-10-CM | POA: Diagnosis not present

## 2022-07-30 DIAGNOSIS — M9902 Segmental and somatic dysfunction of thoracic region: Secondary | ICD-10-CM | POA: Diagnosis not present

## 2022-07-31 ENCOUNTER — Inpatient Hospital Stay: Payer: BC Managed Care – PPO | Attending: Oncology

## 2022-07-31 DIAGNOSIS — D649 Anemia, unspecified: Secondary | ICD-10-CM | POA: Insufficient documentation

## 2022-07-31 DIAGNOSIS — D7581 Myelofibrosis: Secondary | ICD-10-CM | POA: Diagnosis not present

## 2022-07-31 LAB — CBC WITH DIFFERENTIAL (CANCER CENTER ONLY)
Abs Immature Granulocytes: 0.8 10*3/uL — ABNORMAL HIGH (ref 0.00–0.07)
Band Neutrophils: 12 %
Basophils Absolute: 0 10*3/uL (ref 0.0–0.1)
Basophils Relative: 0 %
Eosinophils Absolute: 0.1 10*3/uL (ref 0.0–0.5)
Eosinophils Relative: 1 %
HCT: 30.7 % — ABNORMAL LOW (ref 36.0–46.0)
Hemoglobin: 9.8 g/dL — ABNORMAL LOW (ref 12.0–15.0)
Lymphocytes Relative: 26 %
Lymphs Abs: 2 10*3/uL (ref 0.7–4.0)
MCH: 28.7 pg (ref 26.0–34.0)
MCHC: 31.9 g/dL (ref 30.0–36.0)
MCV: 90 fL (ref 80.0–100.0)
Metamyelocytes Relative: 2 %
Monocytes Absolute: 0.2 10*3/uL (ref 0.1–1.0)
Monocytes Relative: 2 %
Myelocytes: 8 %
Neutro Abs: 4.6 10*3/uL (ref 1.7–7.7)
Neutrophils Relative %: 49 %
Platelet Count: 166 10*3/uL (ref 150–400)
RBC: 3.41 MIL/uL — ABNORMAL LOW (ref 3.87–5.11)
RDW: 17.5 % — ABNORMAL HIGH (ref 11.5–15.5)
Smear Review: NORMAL
WBC Count: 7.6 10*3/uL (ref 4.0–10.5)
nRBC: 2.4 % — ABNORMAL HIGH (ref 0.0–0.2)
nRBC: 3 /100 WBC — ABNORMAL HIGH

## 2022-07-31 LAB — CMP (CANCER CENTER ONLY)
ALT: 15 U/L (ref 0–44)
AST: 21 U/L (ref 15–41)
Albumin: 4.6 g/dL (ref 3.5–5.0)
Alkaline Phosphatase: 52 U/L (ref 38–126)
Anion gap: 8 (ref 5–15)
BUN: 16 mg/dL (ref 6–20)
CO2: 24 mmol/L (ref 22–32)
Calcium: 9.3 mg/dL (ref 8.9–10.3)
Chloride: 108 mmol/L (ref 98–111)
Creatinine: 1 mg/dL (ref 0.44–1.00)
GFR, Estimated: >60 mL/min (ref >60)
Glucose, Bld: 105 mg/dL — ABNORMAL HIGH (ref 70–99)
Potassium: 3.8 mmol/L (ref 3.5–5.1)
Sodium: 140 mmol/L (ref 135–145)
Total Bilirubin: 0.5 mg/dL (ref 0.3–1.2)
Total Protein: 7 g/dL (ref 6.5–8.1)

## 2022-08-03 ENCOUNTER — Other Ambulatory Visit: Payer: Self-pay | Admitting: Nurse Practitioner

## 2022-08-03 ENCOUNTER — Other Ambulatory Visit (HOSPITAL_COMMUNITY): Payer: Self-pay

## 2022-08-03 ENCOUNTER — Other Ambulatory Visit (HOSPITAL_BASED_OUTPATIENT_CLINIC_OR_DEPARTMENT_OTHER): Payer: Self-pay

## 2022-08-03 DIAGNOSIS — D7581 Myelofibrosis: Secondary | ICD-10-CM

## 2022-08-03 MED ORDER — HYDROCODONE-ACETAMINOPHEN 5-325 MG PO TABS
1.0000 | ORAL_TABLET | ORAL | 0 refills | Status: DC | PRN
  Filled 2022-08-03: qty 75, 13d supply, fill #0

## 2022-08-05 ENCOUNTER — Other Ambulatory Visit: Payer: Self-pay | Admitting: Nurse Practitioner

## 2022-08-05 ENCOUNTER — Other Ambulatory Visit (HOSPITAL_BASED_OUTPATIENT_CLINIC_OR_DEPARTMENT_OTHER): Payer: Self-pay

## 2022-08-05 DIAGNOSIS — M9902 Segmental and somatic dysfunction of thoracic region: Secondary | ICD-10-CM | POA: Diagnosis not present

## 2022-08-05 DIAGNOSIS — M62830 Muscle spasm of back: Secondary | ICD-10-CM | POA: Diagnosis not present

## 2022-08-05 DIAGNOSIS — D7581 Myelofibrosis: Secondary | ICD-10-CM

## 2022-08-05 DIAGNOSIS — M9903 Segmental and somatic dysfunction of lumbar region: Secondary | ICD-10-CM | POA: Diagnosis not present

## 2022-08-05 DIAGNOSIS — M5432 Sciatica, left side: Secondary | ICD-10-CM | POA: Diagnosis not present

## 2022-08-05 MED ORDER — ALPRAZOLAM 0.5 MG PO TABS
0.5000 mg | ORAL_TABLET | Freq: Two times a day (BID) | ORAL | 0 refills | Status: DC | PRN
  Filled 2022-08-05: qty 60, 30d supply, fill #0

## 2022-08-10 DIAGNOSIS — M9903 Segmental and somatic dysfunction of lumbar region: Secondary | ICD-10-CM | POA: Diagnosis not present

## 2022-08-10 DIAGNOSIS — M5432 Sciatica, left side: Secondary | ICD-10-CM | POA: Diagnosis not present

## 2022-08-10 DIAGNOSIS — M62830 Muscle spasm of back: Secondary | ICD-10-CM | POA: Diagnosis not present

## 2022-08-10 DIAGNOSIS — M9902 Segmental and somatic dysfunction of thoracic region: Secondary | ICD-10-CM | POA: Diagnosis not present

## 2022-08-19 DIAGNOSIS — M9903 Segmental and somatic dysfunction of lumbar region: Secondary | ICD-10-CM | POA: Diagnosis not present

## 2022-08-19 DIAGNOSIS — M9902 Segmental and somatic dysfunction of thoracic region: Secondary | ICD-10-CM | POA: Diagnosis not present

## 2022-08-19 DIAGNOSIS — M62830 Muscle spasm of back: Secondary | ICD-10-CM | POA: Diagnosis not present

## 2022-08-19 DIAGNOSIS — M5432 Sciatica, left side: Secondary | ICD-10-CM | POA: Diagnosis not present

## 2022-08-20 ENCOUNTER — Other Ambulatory Visit: Payer: Self-pay | Admitting: Nurse Practitioner

## 2022-08-20 ENCOUNTER — Other Ambulatory Visit (HOSPITAL_BASED_OUTPATIENT_CLINIC_OR_DEPARTMENT_OTHER): Payer: Self-pay

## 2022-08-20 DIAGNOSIS — D7581 Myelofibrosis: Secondary | ICD-10-CM

## 2022-08-20 MED ORDER — HYDROCODONE-ACETAMINOPHEN 5-325 MG PO TABS
1.0000 | ORAL_TABLET | ORAL | 0 refills | Status: DC | PRN
  Filled 2022-08-20: qty 75, 13d supply, fill #0

## 2022-08-24 ENCOUNTER — Other Ambulatory Visit (HOSPITAL_COMMUNITY): Payer: Self-pay

## 2022-08-26 ENCOUNTER — Other Ambulatory Visit (HOSPITAL_COMMUNITY): Payer: Self-pay

## 2022-08-26 DIAGNOSIS — M62830 Muscle spasm of back: Secondary | ICD-10-CM | POA: Diagnosis not present

## 2022-08-26 DIAGNOSIS — M9903 Segmental and somatic dysfunction of lumbar region: Secondary | ICD-10-CM | POA: Diagnosis not present

## 2022-08-26 DIAGNOSIS — M5432 Sciatica, left side: Secondary | ICD-10-CM | POA: Diagnosis not present

## 2022-08-26 DIAGNOSIS — M9902 Segmental and somatic dysfunction of thoracic region: Secondary | ICD-10-CM | POA: Diagnosis not present

## 2022-08-28 ENCOUNTER — Other Ambulatory Visit (HOSPITAL_COMMUNITY): Payer: Self-pay

## 2022-08-31 ENCOUNTER — Other Ambulatory Visit (HOSPITAL_COMMUNITY): Payer: Self-pay

## 2022-09-01 DIAGNOSIS — M62830 Muscle spasm of back: Secondary | ICD-10-CM | POA: Diagnosis not present

## 2022-09-01 DIAGNOSIS — M9902 Segmental and somatic dysfunction of thoracic region: Secondary | ICD-10-CM | POA: Diagnosis not present

## 2022-09-01 DIAGNOSIS — M9903 Segmental and somatic dysfunction of lumbar region: Secondary | ICD-10-CM | POA: Diagnosis not present

## 2022-09-01 DIAGNOSIS — M5432 Sciatica, left side: Secondary | ICD-10-CM | POA: Diagnosis not present

## 2022-09-02 ENCOUNTER — Other Ambulatory Visit: Payer: Self-pay | Admitting: Nurse Practitioner

## 2022-09-02 ENCOUNTER — Other Ambulatory Visit (HOSPITAL_BASED_OUTPATIENT_CLINIC_OR_DEPARTMENT_OTHER): Payer: Self-pay

## 2022-09-02 ENCOUNTER — Other Ambulatory Visit (HOSPITAL_COMMUNITY): Payer: Self-pay

## 2022-09-02 DIAGNOSIS — D7581 Myelofibrosis: Secondary | ICD-10-CM

## 2022-09-02 MED ORDER — HYDROCODONE-ACETAMINOPHEN 5-325 MG PO TABS
1.0000 | ORAL_TABLET | ORAL | 0 refills | Status: DC | PRN
  Filled 2022-09-02: qty 75, 13d supply, fill #0

## 2022-09-02 MED ORDER — ALPRAZOLAM 0.5 MG PO TABS
0.5000 mg | ORAL_TABLET | Freq: Two times a day (BID) | ORAL | 0 refills | Status: DC | PRN
  Filled 2022-09-02: qty 60, 30d supply, fill #0

## 2022-09-08 ENCOUNTER — Other Ambulatory Visit (HOSPITAL_COMMUNITY): Payer: Self-pay

## 2022-09-18 ENCOUNTER — Other Ambulatory Visit: Payer: Self-pay | Admitting: Nurse Practitioner

## 2022-09-18 ENCOUNTER — Encounter (HOSPITAL_BASED_OUTPATIENT_CLINIC_OR_DEPARTMENT_OTHER): Payer: Self-pay | Admitting: Pharmacist

## 2022-09-18 ENCOUNTER — Other Ambulatory Visit (HOSPITAL_BASED_OUTPATIENT_CLINIC_OR_DEPARTMENT_OTHER): Payer: Self-pay

## 2022-09-18 DIAGNOSIS — D7581 Myelofibrosis: Secondary | ICD-10-CM

## 2022-09-18 MED ORDER — HYDROCODONE-ACETAMINOPHEN 5-325 MG PO TABS
1.0000 | ORAL_TABLET | ORAL | 0 refills | Status: DC | PRN
  Filled 2022-09-18: qty 75, 13d supply, fill #0

## 2022-09-21 ENCOUNTER — Other Ambulatory Visit (HOSPITAL_BASED_OUTPATIENT_CLINIC_OR_DEPARTMENT_OTHER): Payer: Self-pay

## 2022-09-28 ENCOUNTER — Other Ambulatory Visit (HOSPITAL_COMMUNITY): Payer: Self-pay

## 2022-09-29 ENCOUNTER — Other Ambulatory Visit: Payer: Self-pay | Admitting: Nurse Practitioner

## 2022-09-29 DIAGNOSIS — D7581 Myelofibrosis: Secondary | ICD-10-CM

## 2022-09-30 ENCOUNTER — Other Ambulatory Visit (HOSPITAL_BASED_OUTPATIENT_CLINIC_OR_DEPARTMENT_OTHER): Payer: Self-pay

## 2022-09-30 ENCOUNTER — Other Ambulatory Visit: Payer: Self-pay | Admitting: Nurse Practitioner

## 2022-09-30 ENCOUNTER — Other Ambulatory Visit (HOSPITAL_COMMUNITY): Payer: Self-pay

## 2022-09-30 DIAGNOSIS — D7581 Myelofibrosis: Secondary | ICD-10-CM

## 2022-09-30 MED ORDER — ALPRAZOLAM 0.5 MG PO TABS
0.5000 mg | ORAL_TABLET | Freq: Two times a day (BID) | ORAL | 0 refills | Status: DC | PRN
  Filled 2022-09-30: qty 60, 30d supply, fill #0

## 2022-10-02 ENCOUNTER — Other Ambulatory Visit (HOSPITAL_COMMUNITY): Payer: Self-pay

## 2022-10-05 ENCOUNTER — Other Ambulatory Visit: Payer: Self-pay | Admitting: Nurse Practitioner

## 2022-10-05 DIAGNOSIS — D7581 Myelofibrosis: Secondary | ICD-10-CM

## 2022-10-06 ENCOUNTER — Other Ambulatory Visit (HOSPITAL_BASED_OUTPATIENT_CLINIC_OR_DEPARTMENT_OTHER): Payer: Self-pay

## 2022-10-06 ENCOUNTER — Other Ambulatory Visit: Payer: Self-pay | Admitting: Nurse Practitioner

## 2022-10-06 DIAGNOSIS — D7581 Myelofibrosis: Secondary | ICD-10-CM

## 2022-10-06 MED ORDER — HYDROCODONE-ACETAMINOPHEN 5-325 MG PO TABS
1.0000 | ORAL_TABLET | ORAL | 0 refills | Status: DC | PRN
  Filled 2022-10-06: qty 75, 13d supply, fill #0

## 2022-10-09 ENCOUNTER — Other Ambulatory Visit (HOSPITAL_COMMUNITY): Payer: Self-pay

## 2022-10-12 ENCOUNTER — Other Ambulatory Visit (HOSPITAL_COMMUNITY): Payer: Self-pay

## 2022-10-12 ENCOUNTER — Other Ambulatory Visit: Payer: Self-pay | Admitting: Oncology

## 2022-10-12 DIAGNOSIS — D7581 Myelofibrosis: Secondary | ICD-10-CM

## 2022-10-12 MED ORDER — RUXOLITINIB PHOSPHATE 15 MG PO TABS
ORAL_TABLET | ORAL | 2 refills | Status: DC
  Filled 2022-10-12: qty 45, 30d supply, fill #0
  Filled 2022-11-05: qty 45, 30d supply, fill #1
  Filled 2022-12-22 (×2): qty 45, 30d supply, fill #2

## 2022-10-13 ENCOUNTER — Other Ambulatory Visit (HOSPITAL_COMMUNITY): Payer: Self-pay

## 2022-10-21 ENCOUNTER — Other Ambulatory Visit: Payer: Self-pay | Admitting: Nurse Practitioner

## 2022-10-21 DIAGNOSIS — D7581 Myelofibrosis: Secondary | ICD-10-CM

## 2022-10-22 ENCOUNTER — Other Ambulatory Visit: Payer: Self-pay | Admitting: Nurse Practitioner

## 2022-10-22 ENCOUNTER — Other Ambulatory Visit (HOSPITAL_BASED_OUTPATIENT_CLINIC_OR_DEPARTMENT_OTHER): Payer: Self-pay

## 2022-10-22 DIAGNOSIS — D7581 Myelofibrosis: Secondary | ICD-10-CM

## 2022-10-22 MED ORDER — HYDROCODONE-ACETAMINOPHEN 5-325 MG PO TABS
1.0000 | ORAL_TABLET | ORAL | 0 refills | Status: DC | PRN
  Filled 2022-10-22: qty 75, 13d supply, fill #0

## 2022-10-28 ENCOUNTER — Other Ambulatory Visit: Payer: Self-pay | Admitting: Family Medicine

## 2022-10-28 ENCOUNTER — Other Ambulatory Visit: Payer: Self-pay | Admitting: Nurse Practitioner

## 2022-10-28 DIAGNOSIS — D7581 Myelofibrosis: Secondary | ICD-10-CM

## 2022-10-29 ENCOUNTER — Other Ambulatory Visit: Payer: Self-pay | Admitting: Family Medicine

## 2022-10-29 NOTE — Telephone Encounter (Signed)
Last visit I requested 6-week follow-up I do not see that that was scheduled-lets get that scheduled before refilling this

## 2022-10-30 ENCOUNTER — Inpatient Hospital Stay: Payer: BC Managed Care – PPO

## 2022-10-30 ENCOUNTER — Other Ambulatory Visit (HOSPITAL_BASED_OUTPATIENT_CLINIC_OR_DEPARTMENT_OTHER): Payer: Self-pay

## 2022-10-30 ENCOUNTER — Inpatient Hospital Stay: Payer: BC Managed Care – PPO | Attending: Oncology | Admitting: Oncology

## 2022-10-30 VITALS — BP 135/85 | HR 80 | Temp 97.9°F | Resp 16 | Ht 67.5 in | Wt 156.2 lb

## 2022-10-30 DIAGNOSIS — M898X9 Other specified disorders of bone, unspecified site: Secondary | ICD-10-CM | POA: Diagnosis not present

## 2022-10-30 DIAGNOSIS — C50812 Malignant neoplasm of overlapping sites of left female breast: Secondary | ICD-10-CM | POA: Diagnosis not present

## 2022-10-30 DIAGNOSIS — D7581 Myelofibrosis: Secondary | ICD-10-CM | POA: Diagnosis not present

## 2022-10-30 DIAGNOSIS — Z79899 Other long term (current) drug therapy: Secondary | ICD-10-CM | POA: Insufficient documentation

## 2022-10-30 DIAGNOSIS — C50912 Malignant neoplasm of unspecified site of left female breast: Secondary | ICD-10-CM | POA: Diagnosis not present

## 2022-10-30 DIAGNOSIS — Z853 Personal history of malignant neoplasm of breast: Secondary | ICD-10-CM | POA: Insufficient documentation

## 2022-10-30 DIAGNOSIS — Z79891 Long term (current) use of opiate analgesic: Secondary | ICD-10-CM | POA: Diagnosis not present

## 2022-10-30 LAB — CBC WITH DIFFERENTIAL (CANCER CENTER ONLY)
Abs Immature Granulocytes: 0.8 10*3/uL — ABNORMAL HIGH (ref 0.00–0.07)
Band Neutrophils: 13 %
Basophils Absolute: 0 10*3/uL (ref 0.0–0.1)
Basophils Relative: 0 %
Eosinophils Absolute: 0 10*3/uL (ref 0.0–0.5)
Eosinophils Relative: 0 %
HCT: 33 % — ABNORMAL LOW (ref 36.0–46.0)
Hemoglobin: 10.4 g/dL — ABNORMAL LOW (ref 12.0–15.0)
Lymphocytes Relative: 15 %
Lymphs Abs: 1.2 10*3/uL (ref 0.7–4.0)
MCH: 28.4 pg (ref 26.0–34.0)
MCHC: 31.5 g/dL (ref 30.0–36.0)
MCV: 90.2 fL (ref 80.0–100.0)
Metamyelocytes Relative: 4 %
Monocytes Absolute: 0.5 10*3/uL (ref 0.1–1.0)
Monocytes Relative: 6 %
Myelocytes: 6 %
Neutro Abs: 5.5 10*3/uL (ref 1.7–7.7)
Neutrophils Relative %: 56 %
Platelet Count: 187 10*3/uL (ref 150–400)
RBC: 3.66 MIL/uL — ABNORMAL LOW (ref 3.87–5.11)
RDW: 17.2 % — ABNORMAL HIGH (ref 11.5–15.5)
WBC Count: 8 10*3/uL (ref 4.0–10.5)
nRBC: 1.5 % — ABNORMAL HIGH (ref 0.0–0.2)

## 2022-10-30 LAB — CMP (CANCER CENTER ONLY)
ALT: 10 U/L (ref 0–44)
AST: 17 U/L (ref 15–41)
Albumin: 4.7 g/dL (ref 3.5–5.0)
Alkaline Phosphatase: 56 U/L (ref 38–126)
Anion gap: 11 (ref 5–15)
BUN: 15 mg/dL (ref 6–20)
CO2: 23 mmol/L (ref 22–32)
Calcium: 9.3 mg/dL (ref 8.9–10.3)
Chloride: 106 mmol/L (ref 98–111)
Creatinine: 0.73 mg/dL (ref 0.44–1.00)
GFR, Estimated: >60 mL/min (ref >60)
Glucose, Bld: 108 mg/dL — ABNORMAL HIGH (ref 70–99)
Potassium: 3.7 mmol/L (ref 3.5–5.1)
Sodium: 140 mmol/L (ref 135–145)
Total Bilirubin: 0.5 mg/dL (ref 0.3–1.2)
Total Protein: 7.2 g/dL (ref 6.5–8.1)

## 2022-10-30 MED ORDER — ALPRAZOLAM 0.5 MG PO TABS
0.5000 mg | ORAL_TABLET | Freq: Two times a day (BID) | ORAL | 0 refills | Status: DC | PRN
  Filled 2022-10-30: qty 60, 30d supply, fill #0

## 2022-10-30 NOTE — Progress Notes (Signed)
[facility] OFFICE PROGRESS NOTE   Diagnosis: Myelofibrosis  INTERVAL HISTORY:   [name] returns as scheduled.  She continues ruxolitinib.  No fever or night sweats.  No nausea.  She takes hydrocodone and ibuprofen for bone pain.  She started an antidepressant several months ago.  She eats daily, but is eating less.  Objective:  Vital signs in last 24 hours:  Blood pressure 135/85, pulse 80, temperature 97.9 F (36.6 C), temperature source Oral, resp. rate 16, weight 156 lb 3.2 oz (70.9 kg), SpO2 99 %.    Resp: Clear bilaterally Cardio: Rate and rhythm GI: The spleen is palpable throughout the left abdomen extending to the midline into approximately 1 finger below the umbilicus Vascular: No leg edema  Lab Results:  Lab Results  Component Value Date   WBC 8.0 10/30/2022   HGB 10.4 (L) 10/30/2022   HCT 33.0 (L) 10/30/2022   MCV 90.2 10/30/2022   PLT 187 10/30/2022   NEUTROABS 5.5 10/30/2022    CMP  Lab Results  Component Value Date   NA 140 10/30/2022   K 3.7 10/30/2022   CL 106 10/30/2022   CO2 23 10/30/2022   GLUCOSE 108 (H) 10/30/2022   BUN 15 10/30/2022   CREATININE 0.73 10/30/2022   CALCIUM 9.3 10/30/2022   PROT 7.2 10/30/2022   ALBUMIN 4.7 10/30/2022   AST 17 10/30/2022   ALT 10 10/30/2022   ALKPHOS 56 10/30/2022   BILITOT 0.5 10/30/2022   GFRNONAA >60 10/30/2022   GFRAA 59 (L) 06/12/2020   Medications: I have reviewed the patient's current medications.   Assessment/Plan: Stage II left-sided breast cancer diagnosed in February 2000. Screening mammogram 10/05/2011 with suggestion of a subcentimeter oval mass on the left superiorly and no suspicious masses, calcifications or architectural distortion in the right breast. She underwent biopsy of the "asymmetric density of concern in the superior aspect of the right breast" on 10/13/2011. Pathology showed a fibroadenoma.   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 History of intermittent low-grade fever and "night sweats," likely systemic manifestations of myelofibrosis. History of Nose bleeding-likely related to myelofibrosis with dysfunctional platelets 7. 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   8. Elevated creatinine July 2015-normal 08/06/2014, potentially related to blood pressure medication   9.  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  Increased low back pain July 2020 MRI lumbar spine 06/26/2019- diffuse T1 signal compatible with myelofibrosis, hyperintense T2 signal at T12 and L3, most likely hemangiomas Bone scan 07/04/2019- diffuse increased and homogenous uptake compatible with myelofibrosis, no focal increased activity at T12 or L3 11.  Exertional dyspnea-etiology unclear- evaluated by cardiology      Disposition: [name] is stable from a hematologic standpoint.  She will continue ruxolitinib.  She will return for lab visit in 3 months and an office visit in 6 months.  She continues hydrocodone  and ibuprofen for bone pain.  She will call for new symptoms.  Betsy Coder, MD  10/30/2022  12:46 PM

## 2022-11-02 ENCOUNTER — Ambulatory Visit (INDEPENDENT_AMBULATORY_CARE_PROVIDER_SITE_OTHER): Payer: BC Managed Care – PPO | Admitting: Family Medicine

## 2022-11-02 ENCOUNTER — Encounter: Payer: Self-pay | Admitting: Family Medicine

## 2022-11-02 VITALS — BP 138/64 | HR 86 | Temp 97.8°F | Ht 67.5 in | Wt 160.8 lb

## 2022-11-02 DIAGNOSIS — F411 Generalized anxiety disorder: Secondary | ICD-10-CM | POA: Diagnosis not present

## 2022-11-02 DIAGNOSIS — F5101 Primary insomnia: Secondary | ICD-10-CM | POA: Diagnosis not present

## 2022-11-02 DIAGNOSIS — G4700 Insomnia, unspecified: Secondary | ICD-10-CM | POA: Insufficient documentation

## 2022-11-02 MED ORDER — SCOPOLAMINE 1 MG/3DAYS TD PT72: 1.0000 | MEDICATED_PATCH | TRANSDERMAL | 0 refills | Status: DC

## 2022-11-02 MED ORDER — ZOLPIDEM TARTRATE 10 MG PO TABS: ORAL_TABLET | ORAL | 5 refills | Status: DC

## 2022-11-02 MED ORDER — FLUOXETINE HCL 40 MG PO CAPS: 40.0000 mg | ORAL_CAPSULE | Freq: Every day | ORAL | 5 refills | Status: DC

## 2022-11-02 NOTE — Progress Notes (Signed)
Phone [phone] In person visit   Subjective:   [name] is a 56 y.o. year old very pleasant female patient who presents for/with See problem oriented charting Chief Complaint  Patient presents with   Follow-up    Pt is here to f/u on zoloft, she does not think its working for her.   Past Medical History-  Patient Active Problem List   Diagnosis Date Noted   History of Guillain-Barre syndrome 10/28/2021    Priority: High   Myelofibrosis ([place]) 02/23/2008    Priority: High   Insomnia 11/02/2022    Priority: Medium    GAD (generalized anxiety disorder) 05/08/2022    Priority: Medium    History of breast cancer     Priority: Medium     Medications- reviewed and updated Current Outpatient Medications  Medication Sig Dispense Refill   ALPRAZolam (XANAX) 0.5 MG tablet Take 1 tablet (0.5 mg total) by mouth 2 (two) times daily as needed. 60 tablet 0   FLUoxetine (PROZAC) 40 MG capsule Take 1 capsule (40 mg total) by mouth daily. 30 capsule 5   HYDROcodone-acetaminophen (NORCO/VICODIN) 5-325 MG tablet Take 1 tablet by mouth every 4 (four) hours as needed. 75 tablet 0   ibuprofen (ADVIL) 800 MG tablet TAKE 1 TABLET BY MOUTH DAILY AS NEEDED (MINIMIZE USE IF POSSIBLE- INCREASES STOMACH BLEEDING RISK, HEART RISK, AND KIDNEY FUNCTION ISSUES). FOR PAIN 60 tablet 5   ruxolitinib phosphate (JAKAFI) 15 MG tablet TAKE 15 MG (1 TABLET) BY MOUTH ALTERNATING WITH 30 MG (2 TABLETS) DAILY 45 tablet 2   zolpidem (AMBIEN) 10 MG tablet TAKE 1 TABLET BY MOUTH AT BEDTIME AS NEEDED FOR SLEEP (TRIAL HALF TABLET IF POSSIBLE. DO NOT TAKE THIS WITHIN 8 HOURS OF ALPRAZOLAM OR DRIVE FOR 8 HOURS AFTER TAKING). FOR INSOMNIA 30 tablet 5   No current facility-administered medications for this visit.     Objective:  BP 138/64   Pulse 86   Temp 97.8 F (36.6 C)   Ht 5' 7.5" (1.715 m)   Wt 160 lb 12.8 oz (72.9 kg)   SpO2 100%   BMI 24.81 kg/m  Gen: NAD, resting comfortably    Assessment and Plan    #Social update- disney cruise at end of January- will send in scopalamine but warned of risks particularly sedatoin  # Anxiety/hot flashes (better on [place]) S: Medication: Sertraline 100 mg -At last visit in June patient was on escitalopram 20 mg (had been on 15 years) started by GYN for hot flashes as could not use hormone replacement therapy.  Also had alprazolam available as needed for mental stress related to physical ailments with history of 2 separate types of cancer and chronic pain.  Patient also had recently lost her mother in April 2023.  At last visit she has been interested in a change in medication as symptoms were not well-controlled -Also on alprazolam taking mainly twice a day.  Last visit discussed most separate from Ambien and hydrocodone at least 8 hours- discussed agian  -She is refilling alprazolam approximately every 30 days-perhaps a few days short of not even  - she had read about prozac and celexa -No SI A/P: anxiety poorly controlled- offered increasing sertraline but she would like to tyr alternate and discussed prozac and celexa from her reading. Celexa has higher risk of prolonging QT interval and enantiomer to lexapro/escitalopram so we opted to try the prozac. Asked her to update me in 6 weeks with how she is doing- we can  try to push dose up to 60 mg potentially if not well controlled- then sit back down in 3 months  - any thoughts of self harm - she agrees to let us know immediately -depending on progress consider SNRI or additive therapy or even psychiatry consult  #insomnia- good control om ambien- able to use 5 mg at times- that is preferred if can tolerate- continue current medications - refill today  #also see avs for HM items  Recommended follow up: Return in about 3 months (around 02/01/2023) for followup or sooner if needed.Schedule b4 you leave. Future Appointments  Date Time Provider [place]  12/09/2022  9:20 AM Marin Olp, MD [facility]  01/28/2023  2:45 PM [facility] PHLEBOTOMIST [facility] None  04/29/2023  2:30 PM [facility] PHLEBOTOMIST [facility] None  04/29/2023  3:00 PM Ladell Pier, MD [facility] None    Lab/Order associations:   ICD-10-CM   1. GAD (generalized anxiety disorder)  F41.1 FLUoxetine (PROZAC) 40 MG capsule    zolpidem (AMBIEN) 10 MG tablet    2. Myelofibrosis ([place])  D75.81     3. Primary insomnia  F51.01      Meds ordered this encounter  Medications   FLUoxetine (PROZAC) 40 MG capsule    Sig: Take 1 capsule (40 mg total) by mouth daily.    Dispense:  30 capsule    Refill:  5   zolpidem (AMBIEN) 10 MG tablet    Sig: TAKE 1 TABLET BY MOUTH AT BEDTIME AS NEEDED FOR SLEEP (TRIAL HALF TABLET IF POSSIBLE. DO NOT TAKE THIS WITHIN 8 HOURS OF ALPRAZOLAM OR DRIVE FOR 8 HOURS AFTER TAKING). FOR INSOMNIA    Dispense:  30 tablet    Refill:  5    Return precautions advised.  Garret Reddish, MD

## 2022-11-02 NOTE — Patient Instructions (Addendum)
Sign release of information at the check out desk for PAP Smear.  [facility] contact Please call to schedule visit and/or procedure Address: [address], [place], [place] [address] Phone: [phone]   anxiety poorly controlled- offered increasing sertraline but she would like to tyr alternate and discussed prozac and celexa from her reading. Celexa has higher risk of prolonging QT interval and enantiomer to lexapro/escitalopram so we opted to try the prozac. Asked her to update me in 6 weeks with how she is doing- we can try to push dose up to 60 mg potentially if not well controlled- then sit back down in 3 months  - any thoughts of self harm - she agrees to let us know immediately  Recommended follow up: Return in about 3 months (around 02/01/2023) for followup or sooner if needed.Schedule b4 you leave.  But as above need to know how you are doing in 6 weeks- if you are in a great position- then we can maintain meds- if not and we increase would want to see you at 3 months from today -can cancel visit on January 17th before you leave

## 2022-11-05 ENCOUNTER — Other Ambulatory Visit (HOSPITAL_COMMUNITY): Payer: Self-pay

## 2022-11-06 ENCOUNTER — Other Ambulatory Visit: Payer: Self-pay | Admitting: Nurse Practitioner

## 2022-11-06 ENCOUNTER — Other Ambulatory Visit: Payer: Self-pay

## 2022-11-06 ENCOUNTER — Other Ambulatory Visit (HOSPITAL_BASED_OUTPATIENT_CLINIC_OR_DEPARTMENT_OTHER): Payer: Self-pay

## 2022-11-06 DIAGNOSIS — D7581 Myelofibrosis: Secondary | ICD-10-CM

## 2022-11-06 MED ORDER — HYDROCODONE-ACETAMINOPHEN 5-325 MG PO TABS
1.0000 | ORAL_TABLET | ORAL | 0 refills | Status: DC | PRN
  Filled 2022-11-06: qty 75, 13d supply, fill #0

## 2022-11-10 ENCOUNTER — Other Ambulatory Visit (HOSPITAL_COMMUNITY): Payer: Self-pay

## 2022-11-10 ENCOUNTER — Other Ambulatory Visit: Payer: Self-pay

## 2022-11-23 ENCOUNTER — Other Ambulatory Visit: Payer: Self-pay | Admitting: Nurse Practitioner

## 2022-11-23 DIAGNOSIS — D7581 Myelofibrosis: Secondary | ICD-10-CM

## 2022-11-24 ENCOUNTER — Other Ambulatory Visit: Payer: Self-pay | Admitting: Nurse Practitioner

## 2022-11-24 ENCOUNTER — Other Ambulatory Visit: Payer: Self-pay

## 2022-11-24 ENCOUNTER — Other Ambulatory Visit: Payer: Self-pay | Admitting: Family Medicine

## 2022-11-24 ENCOUNTER — Other Ambulatory Visit (HOSPITAL_BASED_OUTPATIENT_CLINIC_OR_DEPARTMENT_OTHER): Payer: Self-pay

## 2022-11-24 DIAGNOSIS — D7581 Myelofibrosis: Secondary | ICD-10-CM

## 2022-11-24 DIAGNOSIS — F411 Generalized anxiety disorder: Secondary | ICD-10-CM

## 2022-11-24 MED ORDER — HYDROCODONE-ACETAMINOPHEN 5-325 MG PO TABS
1.0000 | ORAL_TABLET | ORAL | 0 refills | Status: DC | PRN
  Filled 2022-11-24: qty 75, 13d supply, fill #0

## 2022-11-27 ENCOUNTER — Other Ambulatory Visit: Payer: Self-pay | Admitting: Nurse Practitioner

## 2022-11-27 ENCOUNTER — Other Ambulatory Visit (HOSPITAL_BASED_OUTPATIENT_CLINIC_OR_DEPARTMENT_OTHER): Payer: Self-pay

## 2022-11-27 DIAGNOSIS — D7581 Myelofibrosis: Secondary | ICD-10-CM

## 2022-11-27 MED ORDER — ALPRAZOLAM 0.5 MG PO TABS
0.5000 mg | ORAL_TABLET | Freq: Two times a day (BID) | ORAL | 1 refills | Status: DC | PRN
  Filled 2022-11-27 – 2022-11-28 (×2): qty 60, 30d supply, fill #0
  Filled 2022-12-27 – 2022-12-28 (×3): qty 60, 30d supply, fill #1

## 2022-11-28 ENCOUNTER — Other Ambulatory Visit (HOSPITAL_BASED_OUTPATIENT_CLINIC_OR_DEPARTMENT_OTHER): Payer: Self-pay

## 2022-11-29 ENCOUNTER — Other Ambulatory Visit: Payer: Self-pay | Admitting: Family Medicine

## 2022-11-29 DIAGNOSIS — D7581 Myelofibrosis: Secondary | ICD-10-CM

## 2022-12-03 ENCOUNTER — Other Ambulatory Visit (HOSPITAL_COMMUNITY): Payer: Self-pay

## 2022-12-07 ENCOUNTER — Other Ambulatory Visit (HOSPITAL_COMMUNITY): Payer: Self-pay

## 2022-12-08 ENCOUNTER — Other Ambulatory Visit: Payer: Self-pay | Admitting: Nurse Practitioner

## 2022-12-08 ENCOUNTER — Other Ambulatory Visit (HOSPITAL_BASED_OUTPATIENT_CLINIC_OR_DEPARTMENT_OTHER): Payer: Self-pay

## 2022-12-08 DIAGNOSIS — D7581 Myelofibrosis: Secondary | ICD-10-CM

## 2022-12-08 MED ORDER — HYDROCODONE-ACETAMINOPHEN 5-325 MG PO TABS
1.0000 | ORAL_TABLET | ORAL | 0 refills | Status: DC | PRN
  Filled 2022-12-08: qty 75, 13d supply, fill #0

## 2022-12-09 ENCOUNTER — Other Ambulatory Visit (HOSPITAL_COMMUNITY): Payer: Self-pay

## 2022-12-09 ENCOUNTER — Ambulatory Visit: Payer: BC Managed Care – PPO | Admitting: Family Medicine

## 2022-12-22 ENCOUNTER — Other Ambulatory Visit (HOSPITAL_COMMUNITY): Payer: Self-pay

## 2022-12-23 ENCOUNTER — Other Ambulatory Visit (HOSPITAL_BASED_OUTPATIENT_CLINIC_OR_DEPARTMENT_OTHER): Payer: Self-pay

## 2022-12-23 ENCOUNTER — Other Ambulatory Visit: Payer: Self-pay | Admitting: Nurse Practitioner

## 2022-12-23 DIAGNOSIS — D7581 Myelofibrosis: Secondary | ICD-10-CM

## 2022-12-23 MED ORDER — HYDROCODONE-ACETAMINOPHEN 5-325 MG PO TABS
1.0000 | ORAL_TABLET | ORAL | 0 refills | Status: DC | PRN
  Filled 2022-12-23: qty 75, 13d supply, fill #0

## 2022-12-24 ENCOUNTER — Other Ambulatory Visit (HOSPITAL_COMMUNITY): Payer: Self-pay

## 2022-12-25 ENCOUNTER — Other Ambulatory Visit (HOSPITAL_COMMUNITY): Payer: Self-pay

## 2022-12-27 ENCOUNTER — Other Ambulatory Visit (HOSPITAL_BASED_OUTPATIENT_CLINIC_OR_DEPARTMENT_OTHER): Payer: Self-pay

## 2022-12-28 ENCOUNTER — Other Ambulatory Visit: Payer: Self-pay

## 2022-12-28 ENCOUNTER — Other Ambulatory Visit (HOSPITAL_BASED_OUTPATIENT_CLINIC_OR_DEPARTMENT_OTHER): Payer: Self-pay

## 2023-01-08 ENCOUNTER — Other Ambulatory Visit: Payer: Self-pay | Admitting: Nurse Practitioner

## 2023-01-08 ENCOUNTER — Other Ambulatory Visit (HOSPITAL_BASED_OUTPATIENT_CLINIC_OR_DEPARTMENT_OTHER): Payer: Self-pay

## 2023-01-08 DIAGNOSIS — D7581 Myelofibrosis: Secondary | ICD-10-CM

## 2023-01-08 MED ORDER — HYDROCODONE-ACETAMINOPHEN 5-325 MG PO TABS
1.0000 | ORAL_TABLET | ORAL | 0 refills | Status: DC | PRN
  Filled 2023-01-08: qty 75, 13d supply, fill #0

## 2023-01-14 ENCOUNTER — Other Ambulatory Visit (HOSPITAL_COMMUNITY): Payer: Self-pay

## 2023-01-18 ENCOUNTER — Other Ambulatory Visit: Payer: Self-pay | Admitting: Oncology

## 2023-01-18 ENCOUNTER — Other Ambulatory Visit: Payer: Self-pay

## 2023-01-18 ENCOUNTER — Other Ambulatory Visit (HOSPITAL_COMMUNITY): Payer: Self-pay

## 2023-01-18 DIAGNOSIS — D7581 Myelofibrosis: Secondary | ICD-10-CM

## 2023-01-18 MED ORDER — RUXOLITINIB PHOSPHATE 15 MG PO TABS
ORAL_TABLET | ORAL | 2 refills | Status: DC
  Filled 2023-01-18: qty 45, 30d supply, fill #0
  Filled 2023-02-18: qty 45, 30d supply, fill #1
  Filled 2023-03-19: qty 45, 30d supply, fill #2

## 2023-01-21 ENCOUNTER — Other Ambulatory Visit (HOSPITAL_COMMUNITY): Payer: Self-pay

## 2023-01-25 ENCOUNTER — Other Ambulatory Visit: Payer: Self-pay | Admitting: Nurse Practitioner

## 2023-01-25 ENCOUNTER — Other Ambulatory Visit (HOSPITAL_BASED_OUTPATIENT_CLINIC_OR_DEPARTMENT_OTHER): Payer: Self-pay

## 2023-01-25 DIAGNOSIS — D7581 Myelofibrosis: Secondary | ICD-10-CM

## 2023-01-25 MED ORDER — HYDROCODONE-ACETAMINOPHEN 5-325 MG PO TABS
1.0000 | ORAL_TABLET | ORAL | 0 refills | Status: DC | PRN
  Filled 2023-01-25: qty 75, 13d supply, fill #0

## 2023-01-26 ENCOUNTER — Encounter (HOSPITAL_BASED_OUTPATIENT_CLINIC_OR_DEPARTMENT_OTHER): Payer: Self-pay | Admitting: Pharmacist

## 2023-01-26 ENCOUNTER — Other Ambulatory Visit (HOSPITAL_BASED_OUTPATIENT_CLINIC_OR_DEPARTMENT_OTHER): Payer: Self-pay

## 2023-01-26 ENCOUNTER — Other Ambulatory Visit: Payer: Self-pay | Admitting: Nurse Practitioner

## 2023-01-26 DIAGNOSIS — D7581 Myelofibrosis: Secondary | ICD-10-CM

## 2023-01-26 MED ORDER — ALPRAZOLAM 0.5 MG PO TABS
0.5000 mg | ORAL_TABLET | Freq: Two times a day (BID) | ORAL | 1 refills | Status: DC | PRN
  Filled 2023-01-26 – 2023-01-27 (×2): qty 60, 30d supply, fill #0
  Filled 2023-02-24: qty 60, 30d supply, fill #1

## 2023-01-27 ENCOUNTER — Other Ambulatory Visit (HOSPITAL_BASED_OUTPATIENT_CLINIC_OR_DEPARTMENT_OTHER): Payer: Self-pay

## 2023-01-28 ENCOUNTER — Inpatient Hospital Stay: Payer: BC Managed Care – PPO | Attending: Oncology

## 2023-01-28 DIAGNOSIS — C50912 Malignant neoplasm of unspecified site of left female breast: Secondary | ICD-10-CM | POA: Insufficient documentation

## 2023-01-28 DIAGNOSIS — D7581 Myelofibrosis: Secondary | ICD-10-CM | POA: Diagnosis not present

## 2023-01-28 LAB — CMP (CANCER CENTER ONLY)
ALT: 11 U/L (ref 0–44)
AST: 16 U/L (ref 15–41)
Albumin: 4 g/dL (ref 3.5–5.0)
Alkaline Phosphatase: 53 U/L (ref 38–126)
Anion gap: 7 (ref 5–15)
BUN: 15 mg/dL (ref 6–20)
CO2: 24 mmol/L (ref 22–32)
Calcium: 9 mg/dL (ref 8.9–10.3)
Chloride: 106 mmol/L (ref 98–111)
Creatinine: 0.93 mg/dL (ref 0.44–1.00)
GFR, Estimated: >60 mL/min (ref >60)
Glucose, Bld: 85 mg/dL (ref 70–99)
Potassium: 3.8 mmol/L (ref 3.5–5.1)
Sodium: 137 mmol/L (ref 135–145)
Total Bilirubin: 0.4 mg/dL (ref 0.3–1.2)
Total Protein: 6.6 g/dL (ref 6.5–8.1)

## 2023-01-28 LAB — CBC WITH DIFFERENTIAL (CANCER CENTER ONLY)
Abs Immature Granulocytes: 1.2 10*3/uL — ABNORMAL HIGH (ref 0.00–0.07)
Band Neutrophils: 12 %
Basophils Absolute: 0.1 10*3/uL (ref 0.0–0.1)
Basophils Relative: 1 %
Eosinophils Absolute: 0 10*3/uL (ref 0.0–0.5)
Eosinophils Relative: 0 %
HCT: 28.9 % — ABNORMAL LOW (ref 36.0–46.0)
Hemoglobin: 9.5 g/dL — ABNORMAL LOW (ref 12.0–15.0)
Lymphocytes Relative: 13 %
Lymphs Abs: 1.2 10*3/uL (ref 0.7–4.0)
MCH: 29.5 pg (ref 26.0–34.0)
MCHC: 32.9 g/dL (ref 30.0–36.0)
MCV: 89.8 fL (ref 80.0–100.0)
Metamyelocytes Relative: 5 %
Monocytes Absolute: 0.4 10*3/uL (ref 0.1–1.0)
Monocytes Relative: 4 %
Myelocytes: 8 %
Neutro Abs: 6.3 10*3/uL (ref 1.7–7.7)
Neutrophils Relative %: 57 %
Platelet Count: 200 10*3/uL (ref 150–400)
RBC: 3.22 MIL/uL — ABNORMAL LOW (ref 3.87–5.11)
RDW: 17.7 % — ABNORMAL HIGH (ref 11.5–15.5)
WBC Count: 9.2 10*3/uL (ref 4.0–10.5)
nRBC: 2 % — ABNORMAL HIGH (ref 0.0–0.2)

## 2023-02-01 ENCOUNTER — Encounter: Payer: Self-pay | Admitting: Family Medicine

## 2023-02-01 ENCOUNTER — Ambulatory Visit (INDEPENDENT_AMBULATORY_CARE_PROVIDER_SITE_OTHER): Payer: BC Managed Care – PPO | Admitting: Family Medicine

## 2023-02-01 VITALS — BP 138/70 | HR 85 | Temp 97.7°F | Ht 67.5 in | Wt 162.0 lb

## 2023-02-01 DIAGNOSIS — F411 Generalized anxiety disorder: Secondary | ICD-10-CM

## 2023-02-01 MED ORDER — CITALOPRAM HYDROBROMIDE 20 MG PO TABS: 20.0000 mg | ORAL_TABLET | Freq: Every day | ORAL | 5 refills | Status: DC

## 2023-02-01 NOTE — Assessment & Plan Note (Signed)
#   Anxiety/insomnia S:Medication: none -had to stop prozac 1 month ago due to significant nausea  -Has trialed both escitalopram (previously on through GYN for hot flashes as could not use hormone replacement therapy), was on sertraline at last visit but did not feel adequate control.  She was interested in Prozac or citalopram trial.  We opted to trial fluoxetine at 40 mg and discussed potential trial up to 60 mg - Could also consider SNRI or additive therapy or even psychiatry consult - Insomnia with good control Ambien 5 mg most of the time-2 mg has been sent in but she knows to try to use just half tablet - Chronic pain from myelofibrosis is very challenging as well as the weight of 2 prior cancer diagnoses and losing her mom in 2013 Counseling: not interested at present    02/01/2023    2:56 PM 11/02/2022    8:46 AM  GAD 7 : Generalized Anxiety Score  Nervous, Anxious, on Edge 2 1  Control/stop worrying 2 0  Worry too much - different things 2 2  Trouble relaxing 2 2  Restless 2 2  Easily annoyed or irritable 2 1  Afraid - awful might happen 1 0  Total GAD 7 Score 13 8  Anxiety Difficulty Somewhat difficult Somewhat difficult      02/01/2023    2:56 PM 11/02/2022    8:46 AM 10/28/2021    3:38 PM  Depression screen PHQ 2/9  Decreased Interest 2 0 0  Down, Depressed, Hopeless 2 0 0  PHQ - 2 Score 4 0 0  Altered sleeping 0 0 0  Tired, decreased energy 2 0 0  Change in appetite 0 0 0  Feeling bad or failure about yourself  0 0 0  Trouble concentrating 2 0 0  Moving slowly or fidgety/restless 0 0 0  Suicidal thoughts 0 0 0  PHQ-9 Score 8 0 0  Difficult doing work/chores Somewhat difficult Not difficult at all Not difficult at all  A/P: GAD7 and phq9 worsened - poor control of anxiety but that is due to being off meds due to side effects of prozac. She asks to trial citalopram and I think that's reasonable- trial lower dose at 20 mg -consider EKG once on medicine to reevaluate  Qt interval- not prolonged in 2022

## 2023-02-01 NOTE — Patient Instructions (Addendum)
[facility] contact Please call to schedule visit and/or procedure Address: [address], [place], [place] [address] Phone: [phone]  Taking the citalopram medicine as directed and not missing any doses is one of the best things you can do to treat your anxiety.  Here are some things to keep in mind:  Side effects (stomach upset, some increased anxiety) may happen before you notice a benefit.  These side effects typically go away over time. Changes to your dose of medicine or a change in medication all together is sometimes necessary Most people need to be on medication at least 6-12 months- with your history suspect long term Many people will notice an improvement within two weeks but the full effect of the medication can take up to 6-8 weeks Stopping the medication when you start feeling better often results in a return of symptoms If you start having thoughts of hurting yourself or others after starting this medicine, call our office immediately at [phone] or seek care through 911.    Recommended follow up: Return in about 2 months (around 04/03/2023) for followup or sooner if needed.Schedule b4 you leave.

## 2023-02-01 NOTE — Progress Notes (Signed)
Phone [phone] In person visit   Subjective:   [name] is a 57 y.o. year old very pleasant female patient who presents for/with See problem oriented charting Chief Complaint  Patient presents with   Follow-up   Anxiety    Past Medical History-  Patient Active Problem List   Diagnosis Date Noted   History of Guillain-Barre syndrome 10/28/2021    Priority: High   Myelofibrosis ([place]) 02/23/2008    Priority: High   Insomnia 11/02/2022    Priority: Medium    GAD (generalized anxiety disorder) 05/08/2022    Priority: Medium    History of breast cancer     Priority: Medium     Medications- reviewed and updated Current Outpatient Medications  Medication Sig Dispense Refill   ALPRAZolam (XANAX) 0.5 MG tablet Take 1 tablet (0.5 mg total) by mouth 2 (two) times daily as needed. 60 tablet 1   citalopram (CELEXA) 20 MG tablet Take 1 tablet (20 mg total) by mouth daily. 30 tablet 5   HYDROcodone-acetaminophen (NORCO/VICODIN) 5-325 MG tablet Take 1 tablet by mouth every 4 (four) hours as needed. 75 tablet 0   ibuprofen (ADVIL) 800 MG tablet TAKE 1 TABLET BY MOUTH DAILY AS NEEDED (MINIMIZE USE IF POSSIBLE- INCREASES STOMACH BLEEDING RISK, HEART RISK, AND KIDNEY FUNCTION ISSUES). FOR PAIN 60 tablet 5   ruxolitinib phosphate (JAKAFI) 15 MG tablet TAKE 15 MG (1 TABLET) BY MOUTH ALTERNATING WITH 30 MG (2 TABLETS) DAILY 45 tablet 2   zolpidem (AMBIEN) 10 MG tablet TAKE 1 TABLET BY MOUTH AT BEDTIME AS NEEDED FOR SLEEP (TRIAL HALF TABLET IF POSSIBLE. DO NOT TAKE THIS WITHIN 8 HOURS OF ALPRAZOLAM OR DRIVE FOR 8 HOURS AFTER TAKING). FOR INSOMNIA 30 tablet 5   No current facility-administered medications for this visit.     Objective:  BP 138/70 Comment: most recent home reading  Pulse 85   Temp 97.7 F (36.5 C) (Temporal)   Ht 5' 7.5" (1.715 m)   Wt 162 lb (73.5 kg)   SpO2 97%   BMI 25.00 kg/m  Gen: NAD, resting comfortably CV: RRR no murmurs rubs or gallops Lungs: CTAB no  crackles, wheeze, rhonchi Ext: no edema Skin: warm, dry     Assessment and Plan   # Social update-had amazing [place] cruise- husband, daughter, son in Sports coach and 2 kids.    # Anxiety/insomnia S:Medication: none -had to stop prozac 1 month ago due to significant nausea  -Has trialed both escitalopram (previously on through GYN for hot flashes as could not use hormone replacement therapy), was on sertraline at last visit but did not feel adequate control.  She was interested in Prozac or citalopram trial.  We opted to trial fluoxetine at 40 mg and discussed potential trial up to 60 mg - Could also consider SNRI or additive therapy or even psychiatry consult - Insomnia with good control Ambien 5 mg most of the time-2 mg has been sent in but she knows to try to use just half tablet - Chronic pain from myelofibrosis is very challenging as well as the weight of 2 prior cancer diagnoses and losing her mom in 2013 Counseling: not interested at present    02/01/2023    2:56 PM 11/02/2022    8:46 AM  GAD 7 : Generalized Anxiety Score  Nervous, Anxious, on Edge 2 1  Control/stop worrying 2 0  Worry too much - different things 2 2  Trouble relaxing 2 2  Restless 2 2  Easily annoyed  or irritable 2 1  Afraid - awful might happen 1 0  Total GAD 7 Score 13 8  Anxiety Difficulty Somewhat difficult Somewhat difficult      02/01/2023    2:56 PM 11/02/2022    8:46 AM 10/28/2021    3:38 PM  Depression screen PHQ 2/9  Decreased Interest 2 0 0  Down, Depressed, Hopeless 2 0 0  PHQ - 2 Score 4 0 0  Altered sleeping 0 0 0  Tired, decreased energy 2 0 0  Change in appetite 0 0 0  Feeling bad or failure about yourself  0 0 0  Trouble concentrating 2 0 0  Moving slowly or fidgety/restless 0 0 0  Suicidal thoughts 0 0 0  PHQ-9 Score 8 0 0  Difficult doing work/chores Somewhat difficult Not difficult at all Not difficult at all  A/P: GAD7 and phq9 worsened - poor control of anxiety but that is due to  being off meds due to side effects of prozac. She asks to trial citalopram and I think that's reasonable- trial lower dose at 20 mg -consider EKG once on medicine to reevaluate Qt interval- not prolonged in 2022   #elevated BP reading- in some more pain today and BP has been better at home- we opted to monitor but discussed prefer <135/85 in long run - need to monitor BP with adding SSRI as well   Recommended follow up: Return in about 2 months (around 04/03/2023) for followup or sooner if needed.Schedule b4 you leave. Future Appointments  Date Time Provider [place]  04/29/2023  2:30 PM [facility] PHLEBOTOMIST [facility] None  04/29/2023  3:00 PM Ladell Pier, MD [facility] None    Lab/Order associations:   ICD-10-CM   1. GAD (generalized anxiety disorder)  F41.1       Meds ordered this encounter  Medications   citalopram (CELEXA) 20 MG tablet    Sig: Take 1 tablet (20 mg total) by mouth daily.    Dispense:  30 tablet    Refill:  5    Return precautions advised.  Garret Reddish, MD

## 2023-02-08 ENCOUNTER — Other Ambulatory Visit (HOSPITAL_BASED_OUTPATIENT_CLINIC_OR_DEPARTMENT_OTHER): Payer: Self-pay

## 2023-02-08 ENCOUNTER — Other Ambulatory Visit: Payer: Self-pay | Admitting: Nurse Practitioner

## 2023-02-08 DIAGNOSIS — D7581 Myelofibrosis: Secondary | ICD-10-CM

## 2023-02-08 MED ORDER — HYDROCODONE-ACETAMINOPHEN 5-325 MG PO TABS
1.0000 | ORAL_TABLET | ORAL | 0 refills | Status: DC | PRN
  Filled 2023-02-08: qty 75, 13d supply, fill #0

## 2023-02-17 ENCOUNTER — Other Ambulatory Visit (HOSPITAL_COMMUNITY): Payer: Self-pay

## 2023-02-18 ENCOUNTER — Other Ambulatory Visit: Payer: Self-pay

## 2023-02-19 LAB — HM MAMMOGRAPHY

## 2023-02-23 ENCOUNTER — Other Ambulatory Visit: Payer: Self-pay | Admitting: Nurse Practitioner

## 2023-02-23 ENCOUNTER — Other Ambulatory Visit: Payer: Self-pay | Admitting: Family Medicine

## 2023-02-23 ENCOUNTER — Other Ambulatory Visit (HOSPITAL_BASED_OUTPATIENT_CLINIC_OR_DEPARTMENT_OTHER): Payer: Self-pay

## 2023-02-23 DIAGNOSIS — D7581 Myelofibrosis: Secondary | ICD-10-CM

## 2023-02-23 MED ORDER — HYDROCODONE-ACETAMINOPHEN 5-325 MG PO TABS
1.0000 | ORAL_TABLET | ORAL | 0 refills | Status: DC | PRN
  Filled 2023-02-23: qty 75, 13d supply, fill #0

## 2023-02-24 ENCOUNTER — Other Ambulatory Visit (HOSPITAL_COMMUNITY): Payer: Self-pay

## 2023-02-24 ENCOUNTER — Other Ambulatory Visit: Payer: Self-pay | Admitting: Family Medicine

## 2023-02-24 ENCOUNTER — Other Ambulatory Visit: Payer: Self-pay

## 2023-02-24 ENCOUNTER — Other Ambulatory Visit (HOSPITAL_BASED_OUTPATIENT_CLINIC_OR_DEPARTMENT_OTHER): Payer: Self-pay

## 2023-03-09 ENCOUNTER — Other Ambulatory Visit: Payer: Self-pay | Admitting: Nurse Practitioner

## 2023-03-09 ENCOUNTER — Other Ambulatory Visit (HOSPITAL_BASED_OUTPATIENT_CLINIC_OR_DEPARTMENT_OTHER): Payer: Self-pay

## 2023-03-09 DIAGNOSIS — D7581 Myelofibrosis: Secondary | ICD-10-CM

## 2023-03-09 MED ORDER — HYDROCODONE-ACETAMINOPHEN 5-325 MG PO TABS
1.0000 | ORAL_TABLET | ORAL | 0 refills | Status: DC | PRN
Start: 2023-03-09 — End: 2023-03-23
  Filled 2023-03-09: qty 75, 13d supply, fill #0

## 2023-03-16 ENCOUNTER — Other Ambulatory Visit (HOSPITAL_COMMUNITY): Payer: Self-pay

## 2023-03-19 ENCOUNTER — Other Ambulatory Visit (HOSPITAL_COMMUNITY): Payer: Self-pay

## 2023-03-22 ENCOUNTER — Other Ambulatory Visit: Payer: Self-pay

## 2023-03-23 ENCOUNTER — Other Ambulatory Visit (HOSPITAL_BASED_OUTPATIENT_CLINIC_OR_DEPARTMENT_OTHER): Payer: Self-pay

## 2023-03-23 ENCOUNTER — Other Ambulatory Visit: Payer: Self-pay | Admitting: Nurse Practitioner

## 2023-03-23 DIAGNOSIS — D7581 Myelofibrosis: Secondary | ICD-10-CM

## 2023-03-23 MED ORDER — HYDROCODONE-ACETAMINOPHEN 5-325 MG PO TABS
1.0000 | ORAL_TABLET | ORAL | 0 refills | Status: DC | PRN
Start: 2023-03-23 — End: 2023-04-06
  Filled 2023-03-23: qty 75, 13d supply, fill #0

## 2023-03-24 ENCOUNTER — Other Ambulatory Visit: Payer: Self-pay | Admitting: Nurse Practitioner

## 2023-03-24 DIAGNOSIS — D7581 Myelofibrosis: Secondary | ICD-10-CM

## 2023-03-25 ENCOUNTER — Other Ambulatory Visit: Payer: Self-pay

## 2023-03-25 ENCOUNTER — Other Ambulatory Visit (HOSPITAL_BASED_OUTPATIENT_CLINIC_OR_DEPARTMENT_OTHER): Payer: Self-pay

## 2023-03-25 MED ORDER — ALPRAZOLAM 0.5 MG PO TABS
0.5000 mg | ORAL_TABLET | Freq: Two times a day (BID) | ORAL | 1 refills | Status: DC | PRN
Start: 2023-03-25 — End: 2023-05-21
  Filled 2023-03-25: qty 60, 30d supply, fill #0
  Filled 2023-04-23: qty 60, 30d supply, fill #1

## 2023-04-05 ENCOUNTER — Ambulatory Visit: Payer: BC Managed Care – PPO | Admitting: Family Medicine

## 2023-04-06 ENCOUNTER — Other Ambulatory Visit: Payer: Self-pay | Admitting: Nurse Practitioner

## 2023-04-06 ENCOUNTER — Other Ambulatory Visit (HOSPITAL_BASED_OUTPATIENT_CLINIC_OR_DEPARTMENT_OTHER): Payer: Self-pay

## 2023-04-06 DIAGNOSIS — D7581 Myelofibrosis: Secondary | ICD-10-CM

## 2023-04-06 MED ORDER — HYDROCODONE-ACETAMINOPHEN 5-325 MG PO TABS
1.0000 | ORAL_TABLET | ORAL | 0 refills | Status: DC | PRN
Start: 2023-04-06 — End: 2023-04-20
  Filled 2023-04-06: qty 75, 13d supply, fill #0

## 2023-04-14 ENCOUNTER — Other Ambulatory Visit: Payer: Self-pay

## 2023-04-14 ENCOUNTER — Other Ambulatory Visit (HOSPITAL_COMMUNITY): Payer: Self-pay

## 2023-04-14 ENCOUNTER — Other Ambulatory Visit: Payer: Self-pay | Admitting: Oncology

## 2023-04-14 DIAGNOSIS — D7581 Myelofibrosis: Secondary | ICD-10-CM

## 2023-04-14 MED ORDER — RUXOLITINIB PHOSPHATE 15 MG PO TABS
ORAL_TABLET | ORAL | 2 refills | Status: DC
Start: 2023-04-14 — End: 2023-07-06
  Filled 2023-04-14: qty 45, 30d supply, fill #0
  Filled 2023-05-14: qty 45, 30d supply, fill #1
  Filled 2023-06-10: qty 45, 30d supply, fill #2

## 2023-04-20 ENCOUNTER — Other Ambulatory Visit: Payer: Self-pay | Admitting: Nurse Practitioner

## 2023-04-20 ENCOUNTER — Other Ambulatory Visit (HOSPITAL_COMMUNITY): Payer: Self-pay

## 2023-04-20 ENCOUNTER — Other Ambulatory Visit (HOSPITAL_BASED_OUTPATIENT_CLINIC_OR_DEPARTMENT_OTHER): Payer: Self-pay

## 2023-04-20 DIAGNOSIS — D7581 Myelofibrosis: Secondary | ICD-10-CM

## 2023-04-20 MED ORDER — HYDROCODONE-ACETAMINOPHEN 5-325 MG PO TABS
1.0000 | ORAL_TABLET | ORAL | 0 refills | Status: DC | PRN
Start: 2023-04-20 — End: 2023-05-04
  Filled 2023-04-20: qty 75, 13d supply, fill #0

## 2023-04-21 ENCOUNTER — Other Ambulatory Visit: Payer: Self-pay

## 2023-04-22 ENCOUNTER — Telehealth: Payer: Self-pay | Admitting: Oncology

## 2023-04-23 ENCOUNTER — Encounter: Payer: Self-pay | Admitting: Oncology

## 2023-04-23 ENCOUNTER — Other Ambulatory Visit: Payer: Self-pay | Admitting: Family Medicine

## 2023-04-23 ENCOUNTER — Other Ambulatory Visit: Payer: Self-pay

## 2023-04-23 ENCOUNTER — Other Ambulatory Visit (HOSPITAL_BASED_OUTPATIENT_CLINIC_OR_DEPARTMENT_OTHER): Payer: Self-pay

## 2023-04-23 DIAGNOSIS — F411 Generalized anxiety disorder: Secondary | ICD-10-CM

## 2023-04-23 NOTE — Telephone Encounter (Signed)
Last refill: 11/02/22 #30, 5 Last OV: 02/01/23 dx. GAD, insomnia

## 2023-04-26 ENCOUNTER — Other Ambulatory Visit: Payer: Self-pay | Admitting: Family Medicine

## 2023-04-26 ENCOUNTER — Encounter: Payer: Self-pay | Admitting: Family Medicine

## 2023-04-27 MED ORDER — ESCITALOPRAM OXALATE 20 MG PO TABS
20.0000 mg | ORAL_TABLET | Freq: Every day | ORAL | 3 refills | Status: DC
  Filled 2023-04-27 – 2023-07-08 (×4): qty 90, 90d supply, fill #0
  Filled 2023-09-17: qty 90, 90d supply, fill #1
  Filled 2024-01-10 – 2024-03-07 (×4): qty 90, 90d supply, fill #2

## 2023-04-28 ENCOUNTER — Other Ambulatory Visit (HOSPITAL_BASED_OUTPATIENT_CLINIC_OR_DEPARTMENT_OTHER): Payer: Self-pay

## 2023-04-29 ENCOUNTER — Inpatient Hospital Stay: Payer: BC Managed Care – PPO

## 2023-04-29 ENCOUNTER — Inpatient Hospital Stay: Payer: BC Managed Care – PPO | Admitting: Oncology

## 2023-05-04 ENCOUNTER — Other Ambulatory Visit (HOSPITAL_BASED_OUTPATIENT_CLINIC_OR_DEPARTMENT_OTHER): Payer: Self-pay

## 2023-05-04 ENCOUNTER — Other Ambulatory Visit: Payer: Self-pay | Admitting: Nurse Practitioner

## 2023-05-04 DIAGNOSIS — D7581 Myelofibrosis: Secondary | ICD-10-CM

## 2023-05-04 MED ORDER — HYDROCODONE-ACETAMINOPHEN 5-325 MG PO TABS
1.0000 | ORAL_TABLET | ORAL | 0 refills | Status: DC | PRN
Start: 2023-05-04 — End: 2023-05-17
  Filled 2023-05-04: qty 75, 13d supply, fill #0

## 2023-05-11 ENCOUNTER — Inpatient Hospital Stay: Payer: BC Managed Care – PPO | Attending: Oncology

## 2023-05-11 ENCOUNTER — Inpatient Hospital Stay: Payer: BC Managed Care – PPO | Admitting: Oncology

## 2023-05-11 VITALS — BP 147/72 | HR 76 | Temp 98.1°F | Resp 16 | Ht 67.5 in | Wt 162.4 lb

## 2023-05-11 DIAGNOSIS — Z853 Personal history of malignant neoplasm of breast: Secondary | ICD-10-CM | POA: Diagnosis not present

## 2023-05-11 DIAGNOSIS — D7581 Myelofibrosis: Secondary | ICD-10-CM | POA: Diagnosis not present

## 2023-05-11 DIAGNOSIS — Z08 Encounter for follow-up examination after completed treatment for malignant neoplasm: Secondary | ICD-10-CM

## 2023-05-11 DIAGNOSIS — D649 Anemia, unspecified: Secondary | ICD-10-CM | POA: Insufficient documentation

## 2023-05-11 LAB — CBC WITH DIFFERENTIAL (CANCER CENTER ONLY)
Abs Immature Granulocytes: 0.6 10*3/uL — ABNORMAL HIGH (ref 0.00–0.07)
Band Neutrophils: 6 %
Basophils Absolute: 0.1 10*3/uL (ref 0.0–0.1)
Basophils Relative: 1 %
Eosinophils Absolute: 0.1 10*3/uL (ref 0.0–0.5)
Eosinophils Relative: 1 %
HCT: 29.3 % — ABNORMAL LOW (ref 36.0–46.0)
Hemoglobin: 9.3 g/dL — ABNORMAL LOW (ref 12.0–15.0)
Lymphocytes Relative: 25 %
Lymphs Abs: 1.9 10*3/uL (ref 0.7–4.0)
MCH: 28.4 pg (ref 26.0–34.0)
MCHC: 31.7 g/dL (ref 30.0–36.0)
MCV: 89.3 fL (ref 80.0–100.0)
Metamyelocytes Relative: 6 %
Monocytes Absolute: 0.4 10*3/uL (ref 0.1–1.0)
Monocytes Relative: 5 %
Myelocytes: 2 %
Neutro Abs: 4.6 10*3/uL (ref 1.7–7.7)
Neutrophils Relative %: 54 %
Platelet Count: 176 10*3/uL (ref 150–400)
RBC: 3.28 MIL/uL — ABNORMAL LOW (ref 3.87–5.11)
RDW: 16.9 % — ABNORMAL HIGH (ref 11.5–15.5)
WBC Count: 7.6 10*3/uL (ref 4.0–10.5)
nRBC: 2.9 % — ABNORMAL HIGH (ref 0.0–0.2)

## 2023-05-11 LAB — CMP (CANCER CENTER ONLY)
ALT: 11 U/L (ref 0–44)
AST: 16 U/L (ref 15–41)
Albumin: 4.1 g/dL (ref 3.5–5.0)
Alkaline Phosphatase: 48 U/L (ref 38–126)
Anion gap: 7 (ref 5–15)
BUN: 19 mg/dL (ref 6–20)
CO2: 24 mmol/L (ref 22–32)
Calcium: 9.1 mg/dL (ref 8.9–10.3)
Chloride: 108 mmol/L (ref 98–111)
Creatinine: 0.86 mg/dL (ref 0.44–1.00)
GFR, Estimated: >60 mL/min (ref >60)
Glucose, Bld: 93 mg/dL (ref 70–99)
Potassium: 4.2 mmol/L (ref 3.5–5.1)
Sodium: 139 mmol/L (ref 135–145)
Total Bilirubin: 0.4 mg/dL (ref 0.3–1.2)
Total Protein: 6.5 g/dL (ref 6.5–8.1)

## 2023-05-11 NOTE — Progress Notes (Signed)
[name] OFFICE PROGRESS NOTE   Diagnosis: Breast cancer, myelofibrosis  INTERVAL HISTORY:   [name] returns as scheduled.  She generally feels well.  She has early satiety.  She reports low abdominal and back discomfort for the past 2 to 3 weeks.  The discomfort is relieved with ibuprofen and hydrocodone.  She continues to have diffuse bone pain.  No dysuria.  No difficulty with bowel function. She feels the spleen size is stable.  Objective:  Vital signs in last 24 hours:  Blood pressure (!) 147/72, pulse 76, temperature 98.1 F (36.7 C), temperature source Oral, resp. rate 16, height 5' 7.5" (1.715 m), weight 162 lb 6.4 oz (73.7 kg), SpO2 100 %.    Lymph nodes: No cervical, supraclavicular, axillary, or inguinal nodes Resp: Lungs clear bilaterally Cardio: Regular rate and rhythm GI: No hepatomegaly, the spleen is palpable throughout the left abdomen extending to below the umbilicus, mild tenderness in the right lower abdomen medial to the iliac.  No mass.  No suprapubic tenderness. Vascular: No leg edema Breast: Status post left mastectomy with implant in place.  No evidence for chest wall tumor recurrence.  Right breast without mass. Musculoskeletal: Tender over the bilateral posterior and lateral iliac.  No mass.   Lab Results:  Lab Results  Component Value Date   WBC 7.6 05/11/2023   HGB 9.3 (L) 05/11/2023   HCT 29.3 (L) 05/11/2023   MCV 89.3 05/11/2023   PLT 176 05/11/2023   NEUTROABS PENDING 05/11/2023    CMP  Lab Results  Component Value Date   NA 139 05/11/2023   K 4.2 05/11/2023   CL 108 05/11/2023   CO2 24 05/11/2023   GLUCOSE 93 05/11/2023   BUN 19 05/11/2023   CREATININE 0.86 05/11/2023   CALCIUM 9.1 05/11/2023   PROT 6.5 05/11/2023   ALBUMIN 4.1 05/11/2023   AST 16 05/11/2023   ALT 11 05/11/2023   ALKPHOS 48 05/11/2023   BILITOT 0.4 05/11/2023   GFRNONAA >60 05/11/2023   GFRAA 59 (L) 06/12/2020    Medications: I have  reviewed the patient's current medications.   Assessment/Plan: Stage II left-sided breast cancer diagnosed in February 2000. Screening mammogram 10/05/2011 with suggestion of a subcentimeter oval mass on the left superiorly and no suspicious masses, calcifications or architectural distortion in the right breast. She underwent biopsy of the "asymmetric density of concern in the superior aspect of the right breast" on 10/13/2011. Pathology showed a fibroadenoma.   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 History of intermittent low-grade fever and "night sweats," likely systemic manifestations of myelofibrosis. History of Nose bleeding-likely related to myelofibrosis with dysfunctional platelets 7. 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   8. Elevated creatinine July 2015-normal 08/06/2014, potentially related to blood pressure medication   9.  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  Increased low back pain July  2020 MRI lumbar spine 06/26/2019- diffuse T1 signal compatible with myelofibrosis, hyperintense T2 signal at T12 and L3, most likely hemangiomas Bone scan 07/04/2019- diffuse increased and homogenous uptake compatible with myelofibrosis, no focal increased  activity at T12 or L3 11.  Exertional dyspnea-etiology unclear- evaluated by cardiology        Disposition: [name] continues ruxolitinib for treatment of myelofibrosis.  She is stable from a hematologic standpoint.  She remains in clinical remission from breast cancer.  Mammogram on 02/19/2023 was negative.  The etiology of the low abdomen/back discomfort is unclear.  I suspect the pain is related to myelofibrosis.  She will call if the pain persists or becomes more intense.  We will consider a CT abdomen/pelvis.  [name] will return for an office and lab visit in 3 months.  We will see her sooner as needed.  Thornton Papas, MD  05/11/2023  8:33 AM

## 2023-05-12 ENCOUNTER — Other Ambulatory Visit (HOSPITAL_COMMUNITY): Payer: Self-pay

## 2023-05-14 ENCOUNTER — Other Ambulatory Visit (HOSPITAL_COMMUNITY): Payer: Self-pay

## 2023-05-14 ENCOUNTER — Ambulatory Visit: Payer: BC Managed Care – PPO | Admitting: Family Medicine

## 2023-05-17 ENCOUNTER — Other Ambulatory Visit: Payer: Self-pay | Admitting: Nurse Practitioner

## 2023-05-17 ENCOUNTER — Other Ambulatory Visit (HOSPITAL_COMMUNITY): Payer: Self-pay

## 2023-05-17 ENCOUNTER — Other Ambulatory Visit (HOSPITAL_BASED_OUTPATIENT_CLINIC_OR_DEPARTMENT_OTHER): Payer: Self-pay

## 2023-05-17 DIAGNOSIS — D7581 Myelofibrosis: Secondary | ICD-10-CM

## 2023-05-17 MED ORDER — HYDROCODONE-ACETAMINOPHEN 5-325 MG PO TABS
1.0000 | ORAL_TABLET | ORAL | 0 refills | Status: DC | PRN
Start: 2023-05-17 — End: 2023-06-01
  Filled 2023-05-17: qty 75, 13d supply, fill #0

## 2023-05-18 ENCOUNTER — Other Ambulatory Visit (HOSPITAL_COMMUNITY): Payer: Self-pay

## 2023-05-21 ENCOUNTER — Other Ambulatory Visit: Payer: Self-pay | Admitting: Oncology

## 2023-05-21 DIAGNOSIS — D7581 Myelofibrosis: Secondary | ICD-10-CM

## 2023-05-22 ENCOUNTER — Other Ambulatory Visit (HOSPITAL_BASED_OUTPATIENT_CLINIC_OR_DEPARTMENT_OTHER): Payer: Self-pay

## 2023-05-24 ENCOUNTER — Other Ambulatory Visit (HOSPITAL_BASED_OUTPATIENT_CLINIC_OR_DEPARTMENT_OTHER): Payer: Self-pay

## 2023-05-24 MED ORDER — ALPRAZOLAM 0.5 MG PO TABS
0.5000 mg | ORAL_TABLET | Freq: Two times a day (BID) | ORAL | 1 refills | Status: DC | PRN
  Filled 2023-05-24: qty 60, 30d supply, fill #0
  Filled 2023-06-21: qty 60, 30d supply, fill #1

## 2023-05-24 MED ORDER — ALPRAZOLAM 0.5 MG PO TABS
0.5000 mg | ORAL_TABLET | Freq: Two times a day (BID) | ORAL | 1 refills | Status: DC | PRN
Start: 2023-05-24 — End: 2023-07-23

## 2023-05-31 ENCOUNTER — Other Ambulatory Visit: Payer: Self-pay | Admitting: Nurse Practitioner

## 2023-05-31 DIAGNOSIS — D7581 Myelofibrosis: Secondary | ICD-10-CM

## 2023-06-01 ENCOUNTER — Other Ambulatory Visit (HOSPITAL_BASED_OUTPATIENT_CLINIC_OR_DEPARTMENT_OTHER): Payer: Self-pay

## 2023-06-01 ENCOUNTER — Other Ambulatory Visit: Payer: Self-pay | Admitting: Nurse Practitioner

## 2023-06-01 DIAGNOSIS — D7581 Myelofibrosis: Secondary | ICD-10-CM

## 2023-06-01 MED ORDER — HYDROCODONE-ACETAMINOPHEN 5-325 MG PO TABS
1.0000 | ORAL_TABLET | ORAL | 0 refills | Status: DC | PRN
Start: 2023-06-01 — End: 2023-06-14
  Filled 2023-06-01: qty 75, 13d supply, fill #0

## 2023-06-05 ENCOUNTER — Other Ambulatory Visit: Payer: Self-pay | Admitting: Family Medicine

## 2023-06-10 ENCOUNTER — Other Ambulatory Visit (HOSPITAL_COMMUNITY): Payer: Self-pay

## 2023-06-11 ENCOUNTER — Other Ambulatory Visit (HOSPITAL_COMMUNITY): Payer: Self-pay

## 2023-06-14 ENCOUNTER — Other Ambulatory Visit (HOSPITAL_COMMUNITY): Payer: Self-pay

## 2023-06-14 ENCOUNTER — Other Ambulatory Visit: Payer: Self-pay | Admitting: Nurse Practitioner

## 2023-06-14 ENCOUNTER — Other Ambulatory Visit (HOSPITAL_BASED_OUTPATIENT_CLINIC_OR_DEPARTMENT_OTHER): Payer: Self-pay

## 2023-06-14 DIAGNOSIS — D7581 Myelofibrosis: Secondary | ICD-10-CM

## 2023-06-14 MED ORDER — HYDROCODONE-ACETAMINOPHEN 5-325 MG PO TABS
1.0000 | ORAL_TABLET | ORAL | 0 refills | Status: DC | PRN
Start: 2023-06-14 — End: 2023-06-28
  Filled 2023-06-14: qty 75, 13d supply, fill #0

## 2023-06-16 ENCOUNTER — Other Ambulatory Visit (HOSPITAL_COMMUNITY): Payer: Self-pay

## 2023-06-17 ENCOUNTER — Other Ambulatory Visit (HOSPITAL_COMMUNITY): Payer: Self-pay

## 2023-06-17 ENCOUNTER — Other Ambulatory Visit: Payer: Self-pay

## 2023-06-21 ENCOUNTER — Encounter: Payer: Self-pay | Admitting: Family Medicine

## 2023-06-21 ENCOUNTER — Other Ambulatory Visit (HOSPITAL_BASED_OUTPATIENT_CLINIC_OR_DEPARTMENT_OTHER): Payer: Self-pay

## 2023-06-22 ENCOUNTER — Other Ambulatory Visit: Payer: Self-pay

## 2023-06-28 ENCOUNTER — Other Ambulatory Visit: Payer: Self-pay | Admitting: Nurse Practitioner

## 2023-06-28 ENCOUNTER — Other Ambulatory Visit (HOSPITAL_BASED_OUTPATIENT_CLINIC_OR_DEPARTMENT_OTHER): Payer: Self-pay

## 2023-06-28 DIAGNOSIS — D7581 Myelofibrosis: Secondary | ICD-10-CM

## 2023-06-28 MED ORDER — HYDROCODONE-ACETAMINOPHEN 5-325 MG PO TABS
1.0000 | ORAL_TABLET | ORAL | 0 refills | Status: DC | PRN
Start: 2023-06-28 — End: 2023-07-12
  Filled 2023-06-28: qty 75, 13d supply, fill #0

## 2023-07-06 ENCOUNTER — Other Ambulatory Visit (HOSPITAL_COMMUNITY): Payer: Self-pay

## 2023-07-06 ENCOUNTER — Other Ambulatory Visit: Payer: Self-pay | Admitting: Oncology

## 2023-07-06 DIAGNOSIS — D7581 Myelofibrosis: Secondary | ICD-10-CM

## 2023-07-07 ENCOUNTER — Other Ambulatory Visit: Payer: Self-pay

## 2023-07-07 ENCOUNTER — Other Ambulatory Visit (HOSPITAL_COMMUNITY): Payer: Self-pay

## 2023-07-07 MED ORDER — RUXOLITINIB PHOSPHATE 15 MG PO TABS
ORAL_TABLET | ORAL | 2 refills | Status: DC
Start: 2023-07-07 — End: 2023-10-11
  Filled 2023-07-07: qty 45, 30d supply, fill #0
  Filled 2023-08-17: qty 45, 30d supply, fill #1
  Filled 2023-09-17: qty 45, 30d supply, fill #2

## 2023-07-08 ENCOUNTER — Other Ambulatory Visit (HOSPITAL_BASED_OUTPATIENT_CLINIC_OR_DEPARTMENT_OTHER): Payer: Self-pay

## 2023-07-09 ENCOUNTER — Other Ambulatory Visit (HOSPITAL_COMMUNITY): Payer: Self-pay

## 2023-07-12 ENCOUNTER — Other Ambulatory Visit (HOSPITAL_COMMUNITY): Payer: Self-pay

## 2023-07-12 ENCOUNTER — Other Ambulatory Visit (HOSPITAL_BASED_OUTPATIENT_CLINIC_OR_DEPARTMENT_OTHER): Payer: Self-pay

## 2023-07-12 ENCOUNTER — Other Ambulatory Visit: Payer: Self-pay | Admitting: Nurse Practitioner

## 2023-07-12 DIAGNOSIS — D7581 Myelofibrosis: Secondary | ICD-10-CM

## 2023-07-12 MED ORDER — HYDROCODONE-ACETAMINOPHEN 5-325 MG PO TABS
1.0000 | ORAL_TABLET | ORAL | 0 refills | Status: DC | PRN
Start: 2023-07-12 — End: 2023-08-02
  Filled 2023-07-12: qty 100, 17d supply, fill #0

## 2023-07-13 ENCOUNTER — Other Ambulatory Visit (HOSPITAL_BASED_OUTPATIENT_CLINIC_OR_DEPARTMENT_OTHER): Payer: Self-pay

## 2023-07-16 ENCOUNTER — Other Ambulatory Visit (HOSPITAL_COMMUNITY): Payer: Self-pay

## 2023-07-19 ENCOUNTER — Other Ambulatory Visit (HOSPITAL_COMMUNITY): Payer: Self-pay

## 2023-07-22 ENCOUNTER — Other Ambulatory Visit: Payer: Self-pay | Admitting: Nurse Practitioner

## 2023-07-23 ENCOUNTER — Other Ambulatory Visit: Payer: Self-pay | Admitting: *Deleted

## 2023-07-23 ENCOUNTER — Other Ambulatory Visit (HOSPITAL_BASED_OUTPATIENT_CLINIC_OR_DEPARTMENT_OTHER): Payer: Self-pay

## 2023-07-23 MED ORDER — ALPRAZOLAM 0.5 MG PO TABS: 0.5000 mg | ORAL_TABLET | Freq: Two times a day (BID) | ORAL | 1 refills | Status: DC | PRN

## 2023-07-23 MED ORDER — ALPRAZOLAM 0.5 MG PO TABS
0.5000 mg | ORAL_TABLET | Freq: Two times a day (BID) | ORAL | 1 refills | Status: DC | PRN
  Filled 2023-07-23: qty 60, 30d supply, fill #0
  Filled 2023-08-19 – 2023-08-20 (×2): qty 60, 30d supply, fill #1

## 2023-08-02 ENCOUNTER — Other Ambulatory Visit (HOSPITAL_BASED_OUTPATIENT_CLINIC_OR_DEPARTMENT_OTHER): Payer: Self-pay

## 2023-08-02 ENCOUNTER — Other Ambulatory Visit: Payer: Self-pay | Admitting: Nurse Practitioner

## 2023-08-02 DIAGNOSIS — D7581 Myelofibrosis: Secondary | ICD-10-CM

## 2023-08-02 MED ORDER — HYDROCODONE-ACETAMINOPHEN 5-325 MG PO TABS
1.0000 | ORAL_TABLET | ORAL | 0 refills | Status: DC | PRN
Start: 2023-08-02 — End: 2023-08-19
  Filled 2023-08-02: qty 100, 17d supply, fill #0

## 2023-08-04 ENCOUNTER — Other Ambulatory Visit (HOSPITAL_COMMUNITY): Payer: Self-pay

## 2023-08-06 ENCOUNTER — Other Ambulatory Visit (HOSPITAL_COMMUNITY): Payer: Self-pay

## 2023-08-09 ENCOUNTER — Other Ambulatory Visit (HOSPITAL_COMMUNITY): Payer: Self-pay

## 2023-08-09 ENCOUNTER — Encounter (HOSPITAL_COMMUNITY): Payer: Self-pay

## 2023-08-10 ENCOUNTER — Inpatient Hospital Stay: Payer: Medicaid Other | Admitting: Oncology

## 2023-08-10 ENCOUNTER — Inpatient Hospital Stay: Payer: Medicaid Other

## 2023-08-17 ENCOUNTER — Other Ambulatory Visit (HOSPITAL_COMMUNITY): Payer: Self-pay

## 2023-08-17 ENCOUNTER — Other Ambulatory Visit: Payer: Self-pay

## 2023-08-17 NOTE — Progress Notes (Signed)
Specialty Pharmacy Refill Coordination Note  [name] is a 57 y.o. female contacted today regarding refills of specialty medication(s) Ruxolitinib Phosphate (Antineoplastic Enzyme Inhibitors) .  Patient requested Delivery  on 08/25/23  to verified address [address] [place] [address]   Medication will be filled on 08/24/23.

## 2023-08-19 ENCOUNTER — Other Ambulatory Visit: Payer: Self-pay

## 2023-08-19 ENCOUNTER — Other Ambulatory Visit (HOSPITAL_BASED_OUTPATIENT_CLINIC_OR_DEPARTMENT_OTHER): Payer: Self-pay

## 2023-08-19 ENCOUNTER — Other Ambulatory Visit: Payer: Self-pay | Admitting: Nurse Practitioner

## 2023-08-19 DIAGNOSIS — D7581 Myelofibrosis: Secondary | ICD-10-CM

## 2023-08-19 MED ORDER — HYDROCODONE-ACETAMINOPHEN 5-325 MG PO TABS
1.0000 | ORAL_TABLET | ORAL | 0 refills | Status: DC | PRN
Start: 2023-08-19 — End: 2023-09-10
  Filled 2023-08-19: qty 100, 17d supply, fill #0

## 2023-08-20 ENCOUNTER — Other Ambulatory Visit (HOSPITAL_BASED_OUTPATIENT_CLINIC_OR_DEPARTMENT_OTHER): Payer: Self-pay

## 2023-08-20 ENCOUNTER — Other Ambulatory Visit: Payer: Self-pay

## 2023-08-21 ENCOUNTER — Other Ambulatory Visit (HOSPITAL_BASED_OUTPATIENT_CLINIC_OR_DEPARTMENT_OTHER): Payer: Self-pay

## 2023-08-24 ENCOUNTER — Other Ambulatory Visit: Payer: Self-pay

## 2023-08-25 ENCOUNTER — Encounter: Payer: Self-pay | Admitting: Nurse Practitioner

## 2023-08-25 ENCOUNTER — Inpatient Hospital Stay: Payer: Medicaid Other | Attending: Oncology

## 2023-08-25 ENCOUNTER — Encounter: Payer: Self-pay | Admitting: Family Medicine

## 2023-08-25 ENCOUNTER — Inpatient Hospital Stay: Payer: Medicaid Other | Admitting: Nurse Practitioner

## 2023-08-25 VITALS — BP 143/78 | HR 70 | Temp 98.3°F | Resp 16 | Ht 67.5 in | Wt 157.7 lb

## 2023-08-25 DIAGNOSIS — R0609 Other forms of dyspnea: Secondary | ICD-10-CM | POA: Diagnosis not present

## 2023-08-25 DIAGNOSIS — Z853 Personal history of malignant neoplasm of breast: Secondary | ICD-10-CM | POA: Insufficient documentation

## 2023-08-25 DIAGNOSIS — Z08 Encounter for follow-up examination after completed treatment for malignant neoplasm: Secondary | ICD-10-CM

## 2023-08-25 DIAGNOSIS — Z79899 Other long term (current) drug therapy: Secondary | ICD-10-CM | POA: Insufficient documentation

## 2023-08-25 DIAGNOSIS — Z79891 Long term (current) use of opiate analgesic: Secondary | ICD-10-CM | POA: Insufficient documentation

## 2023-08-25 DIAGNOSIS — M898X0 Other specified disorders of bone, multiple sites: Secondary | ICD-10-CM | POA: Diagnosis not present

## 2023-08-25 DIAGNOSIS — Z803 Family history of malignant neoplasm of breast: Secondary | ICD-10-CM | POA: Insufficient documentation

## 2023-08-25 DIAGNOSIS — D7581 Myelofibrosis: Secondary | ICD-10-CM | POA: Diagnosis not present

## 2023-08-25 LAB — CBC WITH DIFFERENTIAL (CANCER CENTER ONLY)
Abs Immature Granulocytes: 1.3 10*3/uL — ABNORMAL HIGH (ref 0.00–0.07)
Band Neutrophils: 8 %
Basophils Absolute: 0 10*3/uL (ref 0.0–0.1)
Basophils Relative: 0 %
Eosinophils Absolute: 0.1 10*3/uL (ref 0.0–0.5)
Eosinophils Relative: 1 %
HCT: 31.4 % — ABNORMAL LOW (ref 36.0–46.0)
Hemoglobin: 9.9 g/dL — ABNORMAL LOW (ref 12.0–15.0)
Lymphocytes Relative: 24 %
Lymphs Abs: 2.5 10*3/uL (ref 0.7–4.0)
MCH: 27.8 pg (ref 26.0–34.0)
MCHC: 31.5 g/dL (ref 30.0–36.0)
MCV: 88.2 fL (ref 80.0–100.0)
Metamyelocytes Relative: 10 %
Monocytes Absolute: 1.1 10*3/uL — ABNORMAL HIGH (ref 0.1–1.0)
Monocytes Relative: 11 %
Myelocytes: 3 %
Neutro Abs: 5.3 10*3/uL (ref 1.7–7.7)
Neutrophils Relative %: 43 %
Platelet Count: 207 10*3/uL (ref 150–400)
RBC: 3.56 MIL/uL — ABNORMAL LOW (ref 3.87–5.11)
RDW: 17.3 % — ABNORMAL HIGH (ref 11.5–15.5)
WBC Count: 10.3 10*3/uL (ref 4.0–10.5)
nRBC: 1.3 % — ABNORMAL HIGH (ref 0.0–0.2)
nRBC: 3 /100{WBCs} — ABNORMAL HIGH

## 2023-08-25 LAB — CMP (CANCER CENTER ONLY)
ALT: 9 U/L (ref 0–44)
AST: 16 U/L (ref 15–41)
Albumin: 4.3 g/dL (ref 3.5–5.0)
Alkaline Phosphatase: 56 U/L (ref 38–126)
Anion gap: 5 (ref 5–15)
BUN: 20 mg/dL (ref 6–20)
CO2: 25 mmol/L (ref 22–32)
Calcium: 8.9 mg/dL (ref 8.9–10.3)
Chloride: 106 mmol/L (ref 98–111)
Creatinine: 0.92 mg/dL (ref 0.44–1.00)
GFR, Estimated: >60 mL/min (ref >60)
Glucose, Bld: 115 mg/dL — ABNORMAL HIGH (ref 70–99)
Potassium: 4.2 mmol/L (ref 3.5–5.1)
Sodium: 136 mmol/L (ref 135–145)
Total Bilirubin: 0.4 mg/dL (ref 0.3–1.2)
Total Protein: 6.9 g/dL (ref 6.5–8.1)

## 2023-08-25 NOTE — Progress Notes (Signed)
  [facility] OFFICE PROGRESS NOTE   Diagnosis: Breast cancer, myelofibrosis  INTERVAL HISTORY:   [name] returns as scheduled.  She continues ruxolitinib.  Continued generalized achy bone pain.  The pain is relieved with hydrocodone.  She denies abdominal pain.  No nausea.  She denies bleeding.  No interim illnesses or infections.  No skin change.  The low abdominal/pelvic pain she was experiencing at the time of her last visit resolved.  Objective:  Vital signs in last 24 hours:  Blood pressure (!) 143/78, pulse 70, temperature 98.3 F (36.8 C), temperature source Oral, resp. rate 16, height 5' 7.5" (1.715 m), weight 157 lb 11.2 oz (71.5 kg), SpO2 99%.    HEENT: No thrush or ulcers. Lymphatics: No palpable cervical, supraclavicular or axillary lymph nodes. Resp: Lungs clear bilaterally. Cardio: Regular rate and rhythm. GI: Spleen is palpable throughout the left abdomen extending to below the level of the umbilicus. Vascular: No leg edema.   Lab Results:  Lab Results  Component Value Date   WBC 10.3 08/25/2023   HGB 9.9 (L) 08/25/2023   HCT 31.4 (L) 08/25/2023   MCV 88.2 08/25/2023   PLT 207 08/25/2023   NEUTROABS PENDING 08/25/2023    Imaging:  No results found.  Medications: I have reviewed the patient's current medications.  Assessment/Plan: Stage II left-sided breast cancer diagnosed in February 2000. Screening mammogram 10/05/2011 with suggestion of a subcentimeter oval mass on the left superiorly and no suspicious masses, calcifications or architectural distortion in the right breast. She underwent biopsy of the "asymmetric density of concern in the superior aspect of the right breast" on 10/13/2011. Pathology showed a fibroadenoma.   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 History of intermittent low-grade fever and "night sweats," likely systemic manifestations of myelofibrosis. History of Nose bleeding-likely related to myelofibrosis with dysfunctional platelets 7. 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   8. Elevated creatinine July 2015-normal 08/06/2014, potentially related to blood pressure medication   9.  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  Increased low back pain July 2020 MRI lumbar spine 06/26/2019- diffuse T1 signal compatible with myelofibrosis, hyperintense T2 signal at T12 and L3, most likely hemangiomas Bone scan 07/04/2019- diffuse increased and homogenous uptake compatible with myelofibrosis, no focal increased activity at T12 or L3 11.  Exertional dyspnea-etiology unclear- evaluated by cardiology  Disposition: Ms. Dennen appears stable.  She continues ruxolitinib for treatment of myelofibrosis.  CBC is stable.  She continues hydrocodone as needed for generalized achy bone pain.  She will return for labs in 3 months, office visit in 6 months.  She will contact the office in the interim with any problems.    Lonna Cobb ANP/GNP-BC   08/25/2023  1:39 PM

## 2023-09-10 ENCOUNTER — Other Ambulatory Visit (HOSPITAL_BASED_OUTPATIENT_CLINIC_OR_DEPARTMENT_OTHER): Payer: Self-pay

## 2023-09-10 ENCOUNTER — Other Ambulatory Visit: Payer: Self-pay | Admitting: Nurse Practitioner

## 2023-09-10 DIAGNOSIS — D7581 Myelofibrosis: Secondary | ICD-10-CM

## 2023-09-10 MED ORDER — HYDROCODONE-ACETAMINOPHEN 5-325 MG PO TABS
1.0000 | ORAL_TABLET | ORAL | 0 refills | Status: DC | PRN
Start: 2023-09-10 — End: 2023-10-04
  Filled 2023-09-10: qty 100, 17d supply, fill #0

## 2023-09-14 ENCOUNTER — Other Ambulatory Visit (HOSPITAL_COMMUNITY): Payer: Self-pay

## 2023-09-14 ENCOUNTER — Other Ambulatory Visit (HOSPITAL_BASED_OUTPATIENT_CLINIC_OR_DEPARTMENT_OTHER): Payer: Self-pay

## 2023-09-14 ENCOUNTER — Telehealth: Payer: Self-pay

## 2023-09-14 NOTE — Telephone Encounter (Signed)
Notified patient of prior authorization approval for Hydrocodone 5/325 mg Tablets. Medication is approved through 03/12/2024. No other needs or concerns voiced at this time.

## 2023-09-15 ENCOUNTER — Other Ambulatory Visit: Payer: Self-pay

## 2023-09-17 ENCOUNTER — Other Ambulatory Visit (HOSPITAL_BASED_OUTPATIENT_CLINIC_OR_DEPARTMENT_OTHER): Payer: Self-pay

## 2023-09-17 ENCOUNTER — Other Ambulatory Visit: Payer: Self-pay | Admitting: Oncology

## 2023-09-17 ENCOUNTER — Other Ambulatory Visit: Payer: Self-pay

## 2023-09-17 ENCOUNTER — Other Ambulatory Visit: Payer: Self-pay | Admitting: *Deleted

## 2023-09-17 MED ORDER — ALPRAZOLAM 0.5 MG PO TABS
0.5000 mg | ORAL_TABLET | Freq: Two times a day (BID) | ORAL | 1 refills | Status: DC | PRN
  Filled 2023-09-17 – 2023-09-20 (×2): qty 60, 30d supply, fill #0
  Filled 2023-10-18: qty 60, 30d supply, fill #1

## 2023-09-17 NOTE — Progress Notes (Signed)
Specialty Pharmacy Refill Coordination Note  [name] is a 57 y.o. female contacted today regarding refills of specialty medication(s) No data recorded  Patient requested Delivery   Delivery date: 09/23/23   Verified address: [address] [place] [address]   Medication will be filled on 09/22/23.

## 2023-09-20 ENCOUNTER — Other Ambulatory Visit: Payer: Self-pay

## 2023-09-20 ENCOUNTER — Other Ambulatory Visit (HOSPITAL_BASED_OUTPATIENT_CLINIC_OR_DEPARTMENT_OTHER): Payer: Self-pay

## 2023-10-03 ENCOUNTER — Other Ambulatory Visit: Payer: Self-pay | Admitting: Nurse Practitioner

## 2023-10-03 DIAGNOSIS — D7581 Myelofibrosis: Secondary | ICD-10-CM

## 2023-10-04 ENCOUNTER — Other Ambulatory Visit: Payer: Self-pay | Admitting: Nurse Practitioner

## 2023-10-04 ENCOUNTER — Other Ambulatory Visit (HOSPITAL_BASED_OUTPATIENT_CLINIC_OR_DEPARTMENT_OTHER): Payer: Self-pay

## 2023-10-04 DIAGNOSIS — D7581 Myelofibrosis: Secondary | ICD-10-CM

## 2023-10-04 MED ORDER — HYDROCODONE-ACETAMINOPHEN 5-325 MG PO TABS
1.0000 | ORAL_TABLET | ORAL | 0 refills | Status: DC | PRN
  Filled 2023-10-04: qty 100, 17d supply, fill #0

## 2023-10-04 NOTE — Telephone Encounter (Signed)
Sent to NP 

## 2023-10-05 ENCOUNTER — Other Ambulatory Visit: Payer: Self-pay

## 2023-10-11 ENCOUNTER — Other Ambulatory Visit: Payer: Self-pay

## 2023-10-11 ENCOUNTER — Other Ambulatory Visit: Payer: Self-pay | Admitting: Oncology

## 2023-10-11 DIAGNOSIS — D7581 Myelofibrosis: Secondary | ICD-10-CM

## 2023-10-11 MED ORDER — RUXOLITINIB PHOSPHATE 15 MG PO TABS
ORAL_TABLET | ORAL | 2 refills | Status: DC
  Filled 2023-10-12: qty 45, 30d supply, fill #0
  Filled 2023-11-10: qty 45, 30d supply, fill #1
  Filled 2023-12-02: qty 45, 30d supply, fill #2

## 2023-10-11 NOTE — Progress Notes (Signed)
Specialty Pharmacy Refill Coordination Note  [name] is a 57 y.o. female contacted today regarding refills of specialty medication(s) Ruxolitinib Phosphate (Antineoplastic Enzyme Inhibitors)   Patient requested Delivery   Delivery date: 10/19/23   Verified address: [address] [place] [address]   Medication will be filled on 10/18/23.  Refill request pending.

## 2023-10-12 ENCOUNTER — Other Ambulatory Visit (HOSPITAL_COMMUNITY): Payer: Self-pay

## 2023-10-12 ENCOUNTER — Other Ambulatory Visit: Payer: Self-pay

## 2023-10-18 ENCOUNTER — Other Ambulatory Visit: Payer: Self-pay

## 2023-10-18 ENCOUNTER — Other Ambulatory Visit (HOSPITAL_BASED_OUTPATIENT_CLINIC_OR_DEPARTMENT_OTHER): Payer: Self-pay

## 2023-10-18 ENCOUNTER — Other Ambulatory Visit: Payer: Self-pay | Admitting: Nurse Practitioner

## 2023-10-18 DIAGNOSIS — D7581 Myelofibrosis: Secondary | ICD-10-CM

## 2023-10-18 MED ORDER — HYDROCODONE-ACETAMINOPHEN 5-325 MG PO TABS
1.0000 | ORAL_TABLET | ORAL | 0 refills | Status: DC | PRN
  Filled 2023-10-18 (×2): qty 100, 17d supply, fill #0

## 2023-10-18 NOTE — Telephone Encounter (Signed)
Forwarded to NP.

## 2023-10-22 ENCOUNTER — Other Ambulatory Visit: Payer: Self-pay | Admitting: Family Medicine

## 2023-10-22 ENCOUNTER — Other Ambulatory Visit: Payer: Self-pay

## 2023-10-22 DIAGNOSIS — F411 Generalized anxiety disorder: Secondary | ICD-10-CM

## 2023-10-25 NOTE — Telephone Encounter (Signed)
She needs to schedule a follow-up-she is overdue-that needs to be done before any refill is provided

## 2023-10-26 NOTE — Telephone Encounter (Signed)
Requested Prescriptions   Pending Prescriptions Disp Refills   zolpidem (AMBIEN) 10 MG tablet [Pharmacy Med Name: ZOLPIDEM TARTRATE 10 MG TABLET] 30 tablet     Sig: TAKE 1 TAB AT BEDTIME AS NEED SLEEP.TRY 1/2 TAB.NOT W/IN 8 HRS ALPRAZOLAM/DRIVE 8 HRS AFTERTAKING FOR INSOMNIA     Date of patient request: 10/26/2023 Last office visit: 02/01/2023 Upcoming visit: Visit date not found Date of last refill: 04/23/23 Last refill amount: 30

## 2023-10-27 NOTE — Telephone Encounter (Signed)
Next appt date 11/02/23

## 2023-11-02 ENCOUNTER — Other Ambulatory Visit (HOSPITAL_BASED_OUTPATIENT_CLINIC_OR_DEPARTMENT_OTHER): Payer: Self-pay

## 2023-11-02 ENCOUNTER — Encounter: Payer: Self-pay | Admitting: Family Medicine

## 2023-11-02 ENCOUNTER — Ambulatory Visit: Payer: Medicaid Other | Admitting: Family Medicine

## 2023-11-02 VITALS — BP 120/72 | HR 74 | Temp 97.3°F | Ht 67.5 in | Wt 155.0 lb

## 2023-11-02 DIAGNOSIS — F411 Generalized anxiety disorder: Secondary | ICD-10-CM

## 2023-11-02 DIAGNOSIS — D7581 Myelofibrosis: Secondary | ICD-10-CM

## 2023-11-02 DIAGNOSIS — E785 Hyperlipidemia, unspecified: Secondary | ICD-10-CM | POA: Diagnosis not present

## 2023-11-02 DIAGNOSIS — F5101 Primary insomnia: Secondary | ICD-10-CM

## 2023-11-02 MED ORDER — IBUPROFEN 800 MG PO TABS
ORAL_TABLET | ORAL | 5 refills | Status: DC
  Filled 2023-11-02 – 2023-11-20 (×2): qty 60, 30d supply, fill #0
  Filled 2023-12-21: qty 60, 30d supply, fill #1
  Filled 2024-01-21: qty 60, 30d supply, fill #2

## 2023-11-02 MED ORDER — ZOLPIDEM TARTRATE 10 MG PO TABS
ORAL_TABLET | ORAL | 2 refills | Status: DC
  Filled 2023-11-02: qty 30, fill #0
  Filled 2023-11-12: qty 30, 30d supply, fill #0
  Filled 2023-12-14: qty 30, 30d supply, fill #1
  Filled 2024-01-10 – 2024-01-11 (×4): qty 30, 30d supply, fill #2
  Filled 2024-01-12: qty 15, 30d supply, fill #2
  Filled 2024-02-14 (×2): qty 15, 30d supply, fill #3

## 2023-11-02 NOTE — Patient Instructions (Addendum)
Look forward to meeting your husband- sorry for all he is going through  Have them send [place] a copy when you get the pap smear done  When things settle... [facility] contact Please call to schedule visit and/or procedure Address: [address] [place], [place], [place] [address] Phone: [phone]  Recommended follow up: Return in about 6 months (around 05/02/2024) for followup or sooner if needed.Schedule b4 you leave.

## 2023-11-02 NOTE — Progress Notes (Signed)
Phone [phone] In person visit   Subjective:   [name] is a 57 y.o. year old very pleasant female patient who presents for/with See problem oriented charting Chief Complaint  Patient presents with   Medication Refill    Past Medical History-  Patient Active Problem List   Diagnosis Date Noted   History of Guillain-Barre syndrome 10/28/2021    Priority: High   Myelofibrosis (HCC) 02/23/2008    Priority: High   Insomnia 11/02/2022    Priority: Medium    GAD (generalized anxiety disorder) 05/08/2022    Priority: Medium    History of breast cancer     Priority: Medium     Medications- reviewed and updated Current Outpatient Medications  Medication Sig Dispense Refill   escitalopram (LEXAPRO) 20 MG tablet Take 1 tablet (20 mg total) by mouth daily. 90 tablet 3   ruxolitinib phosphate (JAKAFI) 15 MG tablet TAKE 15 MG (1 TABLET) BY MOUTH ALTERNATING WITH 30 MG (2 TABLETS) DAILY 45 tablet 2   ALPRAZolam (XANAX) 0.5 MG tablet Take 1 tablet (0.5 mg total) by mouth 2 (two) times daily as needed for anxiety (Patient not taking: Reported on 11/02/2023) 60 tablet 1   HYDROcodone-acetaminophen (NORCO/VICODIN) 5-325 MG tablet Take 1 tablet by mouth every 4 (four) hours as needed. (Patient not taking: Reported on 11/02/2023) 100 tablet 0   ibuprofen (ADVIL) 800 MG tablet TAKE 1 TABLET BY MOUTH TWICE DAILY AS NEEDED (MINIMIZE USE IF POSSIBLE- INCREASES STOMACH BLEEDING RISK, HEART RISK, AND KIDNEY FUNCTION ISSUES). FOR PAIN 60 tablet 5   zolpidem (AMBIEN) 10 MG tablet TAKE 1 TABLET AT BEDTIME AS NEEDED FOR SLEEP.TRY 1/2 TAB.NOT W/IN 8 HRS ALPRAZOLAM/DRIVE 8 HRS AFTERTAKING FOR INSOMNIA 30 tablet 2   No current facility-administered medications for this visit.     Objective:  BP 120/72   Pulse 74   Temp (!) 97.3 F (36.3 C)   Ht 5' 7.5" (1.715 m)   Wt 155 lb (70.3 kg)   SpO2 97%   BMI 23.92 kg/m  Gen: NAD, resting comfortably, appropriately tearful when discussing  husband's cancer CV: RRR no murmurs rubs or gallops Lungs: CTAB no crackles, wheeze, rhonchi Ext: no edema Skin: warm, dry     Assessment and Plan   #myelofibrosis- overall stable on ruxolitinib- has follow up labs in january.  Continues to have anemia which is overall stable.  Continues to be on hydrocodone for chronic pain related to myelofibrosis.  I prescribed ibuprofen for pain as well-renal function has been stable-recently checked  #mild high lipids- ok with checking next visit but overdue at the present Lab Results  Component Value Date   CHOL 147 12/30/2020   HDL 29 (L) 12/30/2020   LDLCALC 63 12/30/2020   LDLDIRECT 77.1 09/26/2020   TRIG 276 (H) 12/30/2020   CHOLHDL 5.1 12/30/2020   # Anxiety/hot flashes (better on jakofi)- cancer/chronic pain triggers # Insomnia S:Medication:  Lexapro 20 mg, Ambien 10 mg -sertraline 100 mg- did not feel well controlled -Prozac with nausea -trial citalopram march 2024- not as effective  Stressors: lost parents last year, found out husband recently with lung cancer and 2nd kidney cancer    11/02/2023    4:06 PM 02/01/2023    2:56 PM 11/02/2022    8:46 AM  Depression screen PHQ 2/9  Decreased Interest 1 2 0  Down, Depressed, Hopeless 1 2 0  PHQ - 2 Score 2 4 0  Altered sleeping 1 0 0  Tired, decreased energy 1  2 0  Change in appetite 1 0 0  Feeling bad or failure about yourself  1 0 0  Trouble concentrating 1 2 0  Moving slowly or fidgety/restless 0 0 0  Suicidal thoughts 0 0 0  PHQ-9 Score 7 8 0  Difficult doing work/chores Not difficult at all Somewhat difficult Not difficult at all      11/02/2023    4:06 PM 02/01/2023    2:56 PM 11/02/2022    8:46 AM  GAD 7 : Generalized Anxiety Score  Nervous, Anxious, on Edge 1 2 1   Control/stop worrying 2 2 0  Worry too much - different things 2 2 2   Trouble relaxing 2 2 2   Restless 2 2 2   Easily annoyed or irritable 2 2 1   Afraid - awful might happen 2 1 0  Total GAD 7 Score  13 13 8   Anxiety Difficulty Not difficult at all Somewhat difficult Somewhat difficult   A/P: PHQ-9 and GAD-7 scores mildly elevated.  She actually reports she has been feeling pretty well overall but the issue is the acute stressor of finding out about cancer and her husband-this has been very challenging.  She would like to continue current medication  Insomnia intermittent-intermittently uses Ambien-I refilled this today-precautions noted  Recommended follow up: Return in about 6 months (around 05/02/2024) for followup or sooner if needed.Schedule b4 you leave. Future Appointments  Date Time Provider Department Center  11/25/2023  2:30 PM [facility] PHLEBOTOMIST [facility] None  02/24/2024  2:45 PM [facility] PHLEBOTOMIST [facility] None  02/24/2024  3:00 PM Ladene Artist, MD [facility] None  05/02/2024  3:20 PM Durene Cal Aldine Contes, MD [facility]    Lab/Order associations:   ICD-10-CM   1. Primary insomnia  F51.01     2. GAD (generalized anxiety disorder)  F41.1 zolpidem (AMBIEN) 10 MG tablet      Meds ordered this encounter  Medications   ibuprofen (ADVIL) 800 MG tablet    Sig: TAKE 1 TABLET BY MOUTH TWICE DAILY AS NEEDED (MINIMIZE USE IF POSSIBLE- INCREASES STOMACH BLEEDING RISK, HEART RISK, AND KIDNEY FUNCTION ISSUES). FOR PAIN    Dispense:  60 tablet    Refill:  5   zolpidem (AMBIEN) 10 MG tablet    Sig: TAKE 1 TABLET AT BEDTIME AS NEEDED FOR SLEEP.TRY 1/2 TAB.NOT W/IN 8 HRS ALPRAZOLAM/DRIVE 8 HRS AFTERTAKING FOR INSOMNIA    Dispense:  30 tablet    Refill:  2    G47.00    Return precautions advised.  Tana Conch, MD

## 2023-11-04 ENCOUNTER — Other Ambulatory Visit: Payer: Self-pay | Admitting: Nurse Practitioner

## 2023-11-04 DIAGNOSIS — D7581 Myelofibrosis: Secondary | ICD-10-CM

## 2023-11-05 ENCOUNTER — Other Ambulatory Visit (HOSPITAL_BASED_OUTPATIENT_CLINIC_OR_DEPARTMENT_OTHER): Payer: Self-pay

## 2023-11-05 ENCOUNTER — Other Ambulatory Visit: Payer: Self-pay | Admitting: Nurse Practitioner

## 2023-11-05 DIAGNOSIS — D7581 Myelofibrosis: Secondary | ICD-10-CM

## 2023-11-05 MED ORDER — HYDROCODONE-ACETAMINOPHEN 5-325 MG PO TABS
1.0000 | ORAL_TABLET | ORAL | 0 refills | Status: DC | PRN
  Filled 2023-11-05: qty 100, 17d supply, fill #0

## 2023-11-05 NOTE — Telephone Encounter (Signed)
Given to NP to refill

## 2023-11-10 ENCOUNTER — Other Ambulatory Visit (HOSPITAL_COMMUNITY): Payer: Self-pay

## 2023-11-10 ENCOUNTER — Other Ambulatory Visit: Payer: Self-pay

## 2023-11-10 NOTE — Progress Notes (Signed)
Specialty Pharmacy Refill Coordination Note  [name] is a 56 y.o. female contacted today regarding refills of specialty medication(s) Ruxolitinib Phosphate (JAKAFI)   Patient requested Delivery   Delivery date: 11/15/23   Verified address: [address]   [place] [address]   Medication will be filled on 11/12/23.

## 2023-11-12 ENCOUNTER — Other Ambulatory Visit: Payer: Self-pay

## 2023-11-12 ENCOUNTER — Other Ambulatory Visit: Payer: Self-pay | Admitting: Oncology

## 2023-11-12 ENCOUNTER — Other Ambulatory Visit (HOSPITAL_BASED_OUTPATIENT_CLINIC_OR_DEPARTMENT_OTHER): Payer: Self-pay

## 2023-11-20 ENCOUNTER — Other Ambulatory Visit: Payer: Self-pay | Admitting: Nurse Practitioner

## 2023-11-20 ENCOUNTER — Other Ambulatory Visit (HOSPITAL_BASED_OUTPATIENT_CLINIC_OR_DEPARTMENT_OTHER): Payer: Self-pay

## 2023-11-20 ENCOUNTER — Encounter (HOSPITAL_BASED_OUTPATIENT_CLINIC_OR_DEPARTMENT_OTHER): Payer: Self-pay

## 2023-11-20 DIAGNOSIS — D7581 Myelofibrosis: Secondary | ICD-10-CM

## 2023-11-22 ENCOUNTER — Other Ambulatory Visit (HOSPITAL_BASED_OUTPATIENT_CLINIC_OR_DEPARTMENT_OTHER): Payer: Self-pay

## 2023-11-23 ENCOUNTER — Other Ambulatory Visit: Payer: Self-pay | Admitting: Nurse Practitioner

## 2023-11-23 ENCOUNTER — Other Ambulatory Visit: Payer: Self-pay | Admitting: Oncology

## 2023-11-23 ENCOUNTER — Telehealth: Payer: Self-pay

## 2023-11-23 ENCOUNTER — Other Ambulatory Visit (HOSPITAL_BASED_OUTPATIENT_CLINIC_OR_DEPARTMENT_OTHER): Payer: Self-pay

## 2023-11-23 DIAGNOSIS — D7581 Myelofibrosis: Secondary | ICD-10-CM

## 2023-11-23 MED ORDER — ALPRAZOLAM 0.5 MG PO TABS
0.5000 mg | ORAL_TABLET | Freq: Two times a day (BID) | ORAL | 1 refills | Status: DC | PRN
  Filled 2023-11-23: qty 60, 30d supply, fill #0
  Filled 2023-12-21: qty 60, 30d supply, fill #1

## 2023-11-23 MED ORDER — HYDROCODONE-ACETAMINOPHEN 5-325 MG PO TABS
1.0000 | ORAL_TABLET | ORAL | 0 refills | Status: DC | PRN
  Filled 2023-11-23: qty 100, 17d supply, fill #0

## 2023-11-23 NOTE — Telephone Encounter (Signed)
Patient called requesting refill on   Xanax.

## 2023-11-25 ENCOUNTER — Other Ambulatory Visit: Payer: Medicaid Other

## 2023-11-25 ENCOUNTER — Inpatient Hospital Stay: Payer: Medicaid Other | Attending: Oncology

## 2023-11-25 DIAGNOSIS — Z853 Personal history of malignant neoplasm of breast: Secondary | ICD-10-CM | POA: Insufficient documentation

## 2023-11-25 DIAGNOSIS — D7581 Myelofibrosis: Secondary | ICD-10-CM | POA: Diagnosis not present

## 2023-11-25 DIAGNOSIS — C50912 Malignant neoplasm of unspecified site of left female breast: Secondary | ICD-10-CM | POA: Insufficient documentation

## 2023-11-25 LAB — CMP (CANCER CENTER ONLY)
ALT: 10 U/L (ref 0–44)
AST: 16 U/L (ref 15–41)
Albumin: 4.3 g/dL (ref 3.5–5.0)
Alkaline Phosphatase: 61 U/L (ref 38–126)
Anion gap: 6 (ref 5–15)
BUN: 18 mg/dL (ref 6–20)
CO2: 25 mmol/L (ref 22–32)
Calcium: 8.6 mg/dL — ABNORMAL LOW (ref 8.9–10.3)
Chloride: 106 mmol/L (ref 98–111)
Creatinine: 0.84 mg/dL (ref 0.44–1.00)
GFR, Estimated: >60 mL/min (ref >60)
Glucose, Bld: 117 mg/dL — ABNORMAL HIGH (ref 70–99)
Potassium: 3.4 mmol/L — ABNORMAL LOW (ref 3.5–5.1)
Sodium: 137 mmol/L (ref 135–145)
Total Bilirubin: 0.4 mg/dL (ref 0.0–1.2)
Total Protein: 6.9 g/dL (ref 6.5–8.1)

## 2023-11-25 LAB — CBC WITH DIFFERENTIAL (CANCER CENTER ONLY)
Abs Immature Granulocytes: 1.6 10*3/uL — ABNORMAL HIGH (ref 0.00–0.07)
Band Neutrophils: 10 %
Basophils Absolute: 0.1 10*3/uL (ref 0.0–0.1)
Basophils Relative: 1 %
Eosinophils Absolute: 0 10*3/uL (ref 0.0–0.5)
Eosinophils Relative: 0 %
HCT: 28.3 % — ABNORMAL LOW (ref 36.0–46.0)
Hemoglobin: 8.8 g/dL — ABNORMAL LOW (ref 12.0–15.0)
Lymphocytes Relative: 15 %
Lymphs Abs: 1.4 10*3/uL (ref 0.7–4.0)
MCH: 28 pg (ref 26.0–34.0)
MCHC: 31.1 g/dL (ref 30.0–36.0)
MCV: 90.1 fL (ref 80.0–100.0)
Metamyelocytes Relative: 12 %
Monocytes Absolute: 0.6 10*3/uL (ref 0.1–1.0)
Monocytes Relative: 6 %
Myelocytes: 5 %
Neutro Abs: 5.8 10*3/uL (ref 1.7–7.7)
Neutrophils Relative %: 51 %
Platelet Count: 155 10*3/uL (ref 150–400)
RBC Morphology: NONE SEEN
RBC: 3.14 MIL/uL — ABNORMAL LOW (ref 3.87–5.11)
RDW: 18.2 % — ABNORMAL HIGH (ref 11.5–15.5)
WBC Count: 9.5 10*3/uL (ref 4.0–10.5)
nRBC: 1.7 % — ABNORMAL HIGH (ref 0.0–0.2)
nRBC: 2 /100{WBCs} — ABNORMAL HIGH

## 2023-11-26 ENCOUNTER — Telehealth: Payer: Self-pay

## 2023-11-26 NOTE — Telephone Encounter (Signed)
 Patient gave verbal understanding and had no further questions or concerns

## 2023-11-26 NOTE — Telephone Encounter (Signed)
-----   Message from Lonna Cobb sent at 11/26/2023  2:08 PM EST ----- Please let her know hemoglobin is a little lower.  Call with symptoms of progressive anemia.  Follow-up as scheduled.

## 2023-11-30 ENCOUNTER — Ambulatory Visit: Payer: Medicaid Other | Admitting: Family Medicine

## 2023-12-02 ENCOUNTER — Other Ambulatory Visit (HOSPITAL_COMMUNITY): Payer: Self-pay

## 2023-12-02 ENCOUNTER — Other Ambulatory Visit (HOSPITAL_COMMUNITY): Payer: Self-pay | Admitting: Pharmacy Technician

## 2023-12-02 NOTE — Progress Notes (Signed)
 Specialty Pharmacy Refill Coordination Note  [name] is a 58 y.o. female contacted today regarding refills of specialty medication(s) Ruxolitinib  Phosphate (JAKAFI )   Patient requested Delivery   Delivery date: 12/09/23   Verified address: [address] [place]   Medication will be filled on 12/08/23.

## 2023-12-08 ENCOUNTER — Other Ambulatory Visit: Payer: Self-pay

## 2023-12-08 ENCOUNTER — Other Ambulatory Visit: Payer: Self-pay | Admitting: Nurse Practitioner

## 2023-12-08 DIAGNOSIS — D7581 Myelofibrosis: Secondary | ICD-10-CM

## 2023-12-09 ENCOUNTER — Other Ambulatory Visit: Payer: Self-pay | Admitting: Oncology

## 2023-12-09 ENCOUNTER — Other Ambulatory Visit (HOSPITAL_BASED_OUTPATIENT_CLINIC_OR_DEPARTMENT_OTHER): Payer: Self-pay

## 2023-12-09 DIAGNOSIS — D7581 Myelofibrosis: Secondary | ICD-10-CM

## 2023-12-09 MED ORDER — HYDROCODONE-ACETAMINOPHEN 5-325 MG PO TABS
1.0000 | ORAL_TABLET | ORAL | 0 refills | Status: DC | PRN
  Filled 2023-12-09: qty 100, 17d supply, fill #0

## 2023-12-09 NOTE — Telephone Encounter (Signed)
Forwarded to MD.

## 2023-12-15 ENCOUNTER — Other Ambulatory Visit (HOSPITAL_BASED_OUTPATIENT_CLINIC_OR_DEPARTMENT_OTHER): Payer: Self-pay

## 2023-12-15 ENCOUNTER — Other Ambulatory Visit: Payer: Self-pay

## 2023-12-21 ENCOUNTER — Other Ambulatory Visit: Payer: Self-pay

## 2023-12-21 ENCOUNTER — Other Ambulatory Visit (HOSPITAL_BASED_OUTPATIENT_CLINIC_OR_DEPARTMENT_OTHER): Payer: Self-pay

## 2023-12-24 ENCOUNTER — Other Ambulatory Visit: Payer: Self-pay | Admitting: Oncology

## 2023-12-24 ENCOUNTER — Other Ambulatory Visit (HOSPITAL_BASED_OUTPATIENT_CLINIC_OR_DEPARTMENT_OTHER): Payer: Self-pay

## 2023-12-24 ENCOUNTER — Other Ambulatory Visit: Payer: Self-pay | Admitting: Nurse Practitioner

## 2023-12-24 DIAGNOSIS — D7581 Myelofibrosis: Secondary | ICD-10-CM

## 2023-12-24 MED ORDER — HYDROCODONE-ACETAMINOPHEN 5-325 MG PO TABS
1.0000 | ORAL_TABLET | ORAL | 0 refills | Status: DC | PRN
  Filled 2023-12-24: qty 100, 17d supply, fill #0

## 2023-12-24 NOTE — Telephone Encounter (Signed)
 Forwarded to NP.

## 2023-12-27 ENCOUNTER — Other Ambulatory Visit: Payer: Self-pay

## 2023-12-31 ENCOUNTER — Other Ambulatory Visit: Payer: Self-pay

## 2023-12-31 ENCOUNTER — Other Ambulatory Visit: Payer: Self-pay | Admitting: Oncology

## 2023-12-31 ENCOUNTER — Other Ambulatory Visit (HOSPITAL_COMMUNITY): Payer: Self-pay

## 2023-12-31 DIAGNOSIS — D7581 Myelofibrosis: Secondary | ICD-10-CM

## 2023-12-31 MED ORDER — RUXOLITINIB PHOSPHATE 15 MG PO TABS
ORAL_TABLET | ORAL | 2 refills | Status: DC
  Filled 2023-12-31: qty 45, 30d supply, fill #0
  Filled 2024-02-02 (×2): qty 45, 30d supply, fill #1
  Filled 2024-03-06: qty 45, 30d supply, fill #2

## 2023-12-31 NOTE — Progress Notes (Signed)
 Specialty Pharmacy Refill Coordination Note  [name] is a 58 y.o. female contacted today regarding refills of specialty medication(s) Ruxolitinib  Phosphate (JAKAFI )   Patient requested Delivery   Delivery date: 01/07/24   Verified address: [address] [place]   Medication will be filled on 01/06/24.    This fill date is pending response to refill request from provider. Patient is aware and if they have not received fill by intended date they must follow up with pharmacy.

## 2023-12-31 NOTE — Progress Notes (Signed)
 Specialty Pharmacy Ongoing Clinical Assessment Note  [name] is a 58 y.o. female who is being followed by the specialty pharmacy service for RxSp Bleeding Disorders   Patient's specialty medication(s) reviewed today: Ruxolitinib  Phosphate (JAKAFI )   Missed doses in the last 4 weeks: 0   Patient/Caregiver did not have any additional questions or concerns.   Therapeutic benefit summary: Patient is achieving benefit   Adverse events/side effects summary: No adverse events/side effects   Patient's therapy is appropriate to: Continue    Goals Addressed             This Visit's Progress    Stabilization of disease       Patient is on track. Patient will maintain adherence         Follow up:  6 months  [name] Specialty Pharmacist

## 2024-01-03 ENCOUNTER — Encounter: Payer: Self-pay | Admitting: Genetic Counselor

## 2024-01-03 DIAGNOSIS — Z1509 Genetic susceptibility to other malignant neoplasm: Secondary | ICD-10-CM | POA: Insufficient documentation

## 2024-01-03 DIAGNOSIS — Z1379 Encounter for other screening for genetic and chromosomal anomalies: Secondary | ICD-10-CM | POA: Insufficient documentation

## 2024-01-06 ENCOUNTER — Other Ambulatory Visit: Payer: Self-pay

## 2024-01-07 ENCOUNTER — Other Ambulatory Visit (HOSPITAL_BASED_OUTPATIENT_CLINIC_OR_DEPARTMENT_OTHER): Payer: Self-pay

## 2024-01-07 ENCOUNTER — Other Ambulatory Visit: Payer: Self-pay | Admitting: Nurse Practitioner

## 2024-01-07 DIAGNOSIS — D7581 Myelofibrosis: Secondary | ICD-10-CM

## 2024-01-07 MED ORDER — HYDROCODONE-ACETAMINOPHEN 5-325 MG PO TABS
1.0000 | ORAL_TABLET | ORAL | 0 refills | Status: DC | PRN
  Filled 2024-01-07 – 2024-01-10 (×2): qty 100, 17d supply, fill #0

## 2024-01-10 ENCOUNTER — Other Ambulatory Visit: Payer: Self-pay

## 2024-01-10 ENCOUNTER — Other Ambulatory Visit: Payer: Self-pay | Admitting: Oncology

## 2024-01-10 ENCOUNTER — Other Ambulatory Visit (HOSPITAL_BASED_OUTPATIENT_CLINIC_OR_DEPARTMENT_OTHER): Payer: Self-pay

## 2024-01-10 ENCOUNTER — Other Ambulatory Visit (HOSPITAL_COMMUNITY): Payer: Self-pay

## 2024-01-11 ENCOUNTER — Other Ambulatory Visit (HOSPITAL_BASED_OUTPATIENT_CLINIC_OR_DEPARTMENT_OTHER): Payer: Self-pay

## 2024-01-11 ENCOUNTER — Other Ambulatory Visit: Payer: Self-pay | Admitting: Nurse Practitioner

## 2024-01-11 DIAGNOSIS — D7581 Myelofibrosis: Secondary | ICD-10-CM

## 2024-01-11 MED ORDER — ALPRAZOLAM 0.5 MG PO TABS
0.5000 mg | ORAL_TABLET | Freq: Two times a day (BID) | ORAL | 1 refills | Status: DC | PRN
  Filled 2024-01-11 – 2024-01-18 (×3): qty 60, 30d supply, fill #0
  Filled 2024-02-17: qty 60, 30d supply, fill #1

## 2024-01-11 MED ORDER — ALPRAZOLAM 0.5 MG PO TABS: 0.5000 mg | ORAL_TABLET | Freq: Two times a day (BID) | ORAL | 1 refills | Status: DC | PRN

## 2024-01-12 ENCOUNTER — Other Ambulatory Visit (HOSPITAL_BASED_OUTPATIENT_CLINIC_OR_DEPARTMENT_OTHER): Payer: Self-pay

## 2024-01-12 ENCOUNTER — Encounter (HOSPITAL_BASED_OUTPATIENT_CLINIC_OR_DEPARTMENT_OTHER): Payer: Self-pay

## 2024-01-12 ENCOUNTER — Other Ambulatory Visit: Payer: Self-pay

## 2024-01-12 ENCOUNTER — Encounter: Payer: Self-pay | Admitting: Genetic Counselor

## 2024-01-13 ENCOUNTER — Other Ambulatory Visit (HOSPITAL_BASED_OUTPATIENT_CLINIC_OR_DEPARTMENT_OTHER): Payer: Self-pay

## 2024-01-14 ENCOUNTER — Other Ambulatory Visit: Payer: Self-pay

## 2024-01-14 ENCOUNTER — Other Ambulatory Visit (HOSPITAL_BASED_OUTPATIENT_CLINIC_OR_DEPARTMENT_OTHER): Payer: Self-pay

## 2024-01-15 ENCOUNTER — Other Ambulatory Visit (HOSPITAL_BASED_OUTPATIENT_CLINIC_OR_DEPARTMENT_OTHER): Payer: Self-pay

## 2024-01-17 ENCOUNTER — Other Ambulatory Visit (HOSPITAL_BASED_OUTPATIENT_CLINIC_OR_DEPARTMENT_OTHER): Payer: Self-pay

## 2024-01-18 ENCOUNTER — Other Ambulatory Visit (HOSPITAL_BASED_OUTPATIENT_CLINIC_OR_DEPARTMENT_OTHER): Payer: Self-pay

## 2024-01-21 ENCOUNTER — Other Ambulatory Visit (HOSPITAL_BASED_OUTPATIENT_CLINIC_OR_DEPARTMENT_OTHER): Payer: Self-pay

## 2024-01-21 ENCOUNTER — Other Ambulatory Visit: Payer: Self-pay

## 2024-01-24 ENCOUNTER — Other Ambulatory Visit: Payer: Self-pay | Admitting: Nurse Practitioner

## 2024-01-24 ENCOUNTER — Other Ambulatory Visit: Payer: Self-pay

## 2024-01-24 ENCOUNTER — Other Ambulatory Visit (HOSPITAL_BASED_OUTPATIENT_CLINIC_OR_DEPARTMENT_OTHER): Payer: Self-pay

## 2024-01-24 DIAGNOSIS — D7581 Myelofibrosis: Secondary | ICD-10-CM

## 2024-01-24 MED ORDER — HYDROCODONE-ACETAMINOPHEN 5-325 MG PO TABS
1.0000 | ORAL_TABLET | ORAL | 0 refills | Status: DC | PRN
Start: 2024-01-24 — End: 2024-02-10
  Filled 2024-01-24 – 2024-01-25 (×2): qty 100, 17d supply, fill #0

## 2024-01-24 NOTE — Telephone Encounter (Signed)
 To NP

## 2024-01-25 ENCOUNTER — Other Ambulatory Visit (HOSPITAL_BASED_OUTPATIENT_CLINIC_OR_DEPARTMENT_OTHER): Payer: Self-pay

## 2024-01-25 ENCOUNTER — Other Ambulatory Visit: Payer: Self-pay

## 2024-01-27 ENCOUNTER — Other Ambulatory Visit: Payer: Self-pay

## 2024-01-31 ENCOUNTER — Other Ambulatory Visit (HOSPITAL_COMMUNITY): Payer: Self-pay

## 2024-02-02 ENCOUNTER — Other Ambulatory Visit (HOSPITAL_COMMUNITY): Payer: Self-pay

## 2024-02-02 ENCOUNTER — Other Ambulatory Visit: Payer: Self-pay

## 2024-02-02 NOTE — Progress Notes (Signed)
 Specialty Pharmacy Refill Coordination Note  [name] is a 58 y.o. female contacted today regarding refills of specialty medication(s) Ruxolitinib Phosphate (JAKAFI)   Patient requested Delivery   Delivery date: 02/04/24   Verified address: [address]   [place] [address]   Medication will be filled on 02/03/24.

## 2024-02-04 DIAGNOSIS — Z853 Personal history of malignant neoplasm of breast: Secondary | ICD-10-CM | POA: Diagnosis not present

## 2024-02-04 DIAGNOSIS — Z6823 Body mass index (BMI) 23.0-23.9, adult: Secondary | ICD-10-CM | POA: Diagnosis not present

## 2024-02-04 DIAGNOSIS — N952 Postmenopausal atrophic vaginitis: Secondary | ICD-10-CM | POA: Diagnosis not present

## 2024-02-04 DIAGNOSIS — Z0000 Encounter for general adult medical examination without abnormal findings: Secondary | ICD-10-CM | POA: Diagnosis not present

## 2024-02-04 DIAGNOSIS — D7581 Myelofibrosis: Secondary | ICD-10-CM | POA: Diagnosis not present

## 2024-02-09 ENCOUNTER — Other Ambulatory Visit: Payer: Self-pay | Admitting: Nurse Practitioner

## 2024-02-09 DIAGNOSIS — D7581 Myelofibrosis: Secondary | ICD-10-CM

## 2024-02-10 ENCOUNTER — Other Ambulatory Visit: Payer: Self-pay | Admitting: Nurse Practitioner

## 2024-02-10 ENCOUNTER — Other Ambulatory Visit (HOSPITAL_BASED_OUTPATIENT_CLINIC_OR_DEPARTMENT_OTHER): Payer: Self-pay

## 2024-02-10 DIAGNOSIS — D7581 Myelofibrosis: Secondary | ICD-10-CM

## 2024-02-10 MED ORDER — HYDROCODONE-ACETAMINOPHEN 5-325 MG PO TABS
1.0000 | ORAL_TABLET | ORAL | 0 refills | Status: DC | PRN
  Filled 2024-02-10: qty 100, 17d supply, fill #0

## 2024-02-14 ENCOUNTER — Other Ambulatory Visit (HOSPITAL_BASED_OUTPATIENT_CLINIC_OR_DEPARTMENT_OTHER): Payer: Self-pay

## 2024-02-14 ENCOUNTER — Other Ambulatory Visit: Payer: Self-pay

## 2024-02-15 ENCOUNTER — Other Ambulatory Visit: Payer: Self-pay

## 2024-02-17 ENCOUNTER — Other Ambulatory Visit: Payer: Self-pay

## 2024-02-17 ENCOUNTER — Other Ambulatory Visit (HOSPITAL_BASED_OUTPATIENT_CLINIC_OR_DEPARTMENT_OTHER): Payer: Self-pay

## 2024-02-22 ENCOUNTER — Other Ambulatory Visit (HOSPITAL_BASED_OUTPATIENT_CLINIC_OR_DEPARTMENT_OTHER): Payer: Self-pay

## 2024-02-24 ENCOUNTER — Other Ambulatory Visit: Payer: Self-pay

## 2024-02-24 ENCOUNTER — Ambulatory Visit: Payer: Medicaid Other | Admitting: Oncology

## 2024-02-24 ENCOUNTER — Other Ambulatory Visit: Payer: Medicaid Other

## 2024-02-25 DIAGNOSIS — Z1231 Encounter for screening mammogram for malignant neoplasm of breast: Secondary | ICD-10-CM | POA: Diagnosis not present

## 2024-02-25 LAB — HM MAMMOGRAPHY

## 2024-02-28 ENCOUNTER — Other Ambulatory Visit: Payer: Self-pay | Admitting: Nurse Practitioner

## 2024-02-28 DIAGNOSIS — D7581 Myelofibrosis: Secondary | ICD-10-CM

## 2024-02-29 ENCOUNTER — Other Ambulatory Visit (HOSPITAL_BASED_OUTPATIENT_CLINIC_OR_DEPARTMENT_OTHER): Payer: Self-pay

## 2024-02-29 ENCOUNTER — Other Ambulatory Visit: Payer: Self-pay | Admitting: Nurse Practitioner

## 2024-02-29 DIAGNOSIS — D7581 Myelofibrosis: Secondary | ICD-10-CM

## 2024-02-29 MED ORDER — HYDROCODONE-ACETAMINOPHEN 5-325 MG PO TABS
1.0000 | ORAL_TABLET | ORAL | 0 refills | Status: DC | PRN
Start: 2024-02-29 — End: 2024-03-15
  Filled 2024-02-29: qty 100, 17d supply, fill #0

## 2024-03-01 ENCOUNTER — Encounter: Payer: Self-pay | Admitting: Family Medicine

## 2024-03-01 ENCOUNTER — Other Ambulatory Visit (HOSPITAL_BASED_OUTPATIENT_CLINIC_OR_DEPARTMENT_OTHER): Payer: Self-pay

## 2024-03-06 ENCOUNTER — Other Ambulatory Visit: Payer: Self-pay

## 2024-03-06 NOTE — Progress Notes (Signed)
 Specialty Pharmacy Refill Coordination Note  [name] is a 58 y.o. female contacted today regarding refills of specialty medication(s) Ruxolitinib Phosphate (JAKAFI)   Patient requested Delivery   Delivery date: 03/08/24   Verified address: [address]   [place] [address]   Medication will be filled on 03/07/24.

## 2024-03-07 ENCOUNTER — Other Ambulatory Visit (HOSPITAL_BASED_OUTPATIENT_CLINIC_OR_DEPARTMENT_OTHER): Payer: Self-pay

## 2024-03-07 ENCOUNTER — Other Ambulatory Visit (HOSPITAL_COMMUNITY): Payer: Self-pay

## 2024-03-08 ENCOUNTER — Encounter: Payer: Self-pay | Admitting: *Deleted

## 2024-03-08 NOTE — Progress Notes (Signed)
 MD reviewed mammogram report from [facility]. Noted she has not f/u scheduled. Scheduling message sent for lab/OV in May per Dr. Scherrie Curt.

## 2024-03-13 ENCOUNTER — Other Ambulatory Visit: Payer: Self-pay | Admitting: Family Medicine

## 2024-03-13 DIAGNOSIS — F411 Generalized anxiety disorder: Secondary | ICD-10-CM

## 2024-03-14 ENCOUNTER — Other Ambulatory Visit (HOSPITAL_BASED_OUTPATIENT_CLINIC_OR_DEPARTMENT_OTHER): Payer: Self-pay

## 2024-03-14 MED ORDER — ZOLPIDEM TARTRATE 10 MG PO TABS
ORAL_TABLET | ORAL | 2 refills | Status: DC
  Filled 2024-03-14: qty 30, 30d supply, fill #0
  Filled 2024-04-13 (×2): qty 30, 30d supply, fill #1

## 2024-03-15 ENCOUNTER — Other Ambulatory Visit (HOSPITAL_BASED_OUTPATIENT_CLINIC_OR_DEPARTMENT_OTHER): Payer: Self-pay

## 2024-03-15 ENCOUNTER — Other Ambulatory Visit: Payer: Self-pay | Admitting: Nurse Practitioner

## 2024-03-15 DIAGNOSIS — D7581 Myelofibrosis: Secondary | ICD-10-CM

## 2024-03-15 MED ORDER — HYDROCODONE-ACETAMINOPHEN 5-325 MG PO TABS
1.0000 | ORAL_TABLET | ORAL | 0 refills | Status: DC | PRN
Start: 2024-03-15 — End: 2024-04-04
  Filled 2024-03-15 – 2024-03-18 (×3): qty 100, 17d supply, fill #0

## 2024-03-15 MED ORDER — ALPRAZOLAM 0.5 MG PO TABS
0.5000 mg | ORAL_TABLET | Freq: Two times a day (BID) | ORAL | 1 refills | Status: DC | PRN
  Filled 2024-03-15 – 2024-03-17 (×2): qty 60, 30d supply, fill #0
  Filled 2024-04-13 – 2024-04-14 (×2): qty 60, 30d supply, fill #1

## 2024-03-16 ENCOUNTER — Other Ambulatory Visit: Payer: Self-pay

## 2024-03-16 ENCOUNTER — Other Ambulatory Visit (HOSPITAL_BASED_OUTPATIENT_CLINIC_OR_DEPARTMENT_OTHER): Payer: Self-pay

## 2024-03-17 ENCOUNTER — Other Ambulatory Visit (HOSPITAL_BASED_OUTPATIENT_CLINIC_OR_DEPARTMENT_OTHER): Payer: Self-pay

## 2024-03-18 ENCOUNTER — Other Ambulatory Visit (HOSPITAL_BASED_OUTPATIENT_CLINIC_OR_DEPARTMENT_OTHER): Payer: Self-pay

## 2024-03-30 ENCOUNTER — Other Ambulatory Visit: Payer: Self-pay

## 2024-04-03 ENCOUNTER — Other Ambulatory Visit: Payer: Self-pay | Admitting: Nurse Practitioner

## 2024-04-03 DIAGNOSIS — D7581 Myelofibrosis: Secondary | ICD-10-CM

## 2024-04-03 NOTE — Telephone Encounter (Signed)
 Forwarded to NP.

## 2024-04-04 ENCOUNTER — Other Ambulatory Visit (HOSPITAL_BASED_OUTPATIENT_CLINIC_OR_DEPARTMENT_OTHER): Payer: Self-pay

## 2024-04-04 ENCOUNTER — Other Ambulatory Visit: Payer: Self-pay | Admitting: Nurse Practitioner

## 2024-04-04 DIAGNOSIS — D7581 Myelofibrosis: Secondary | ICD-10-CM

## 2024-04-04 MED ORDER — HYDROCODONE-ACETAMINOPHEN 5-325 MG PO TABS
1.0000 | ORAL_TABLET | ORAL | 0 refills | Status: DC | PRN
  Filled 2024-04-04: qty 100, 17d supply, fill #0

## 2024-04-05 ENCOUNTER — Other Ambulatory Visit: Payer: Self-pay

## 2024-04-05 ENCOUNTER — Other Ambulatory Visit: Payer: Self-pay | Admitting: Oncology

## 2024-04-05 DIAGNOSIS — D7581 Myelofibrosis: Secondary | ICD-10-CM

## 2024-04-05 MED ORDER — RUXOLITINIB PHOSPHATE 15 MG PO TABS
ORAL_TABLET | ORAL | 0 refills | Status: DC
  Filled 2024-04-05: qty 45, 30d supply, fill #0

## 2024-04-05 NOTE — Progress Notes (Signed)
 Specialty Pharmacy Refill Coordination Note  [name] is a 58 y.o. female contacted today regarding refills of specialty medication(s) Ruxolitinib  Phosphate (JAKAFI )   Patient requested Delivery   Delivery date: 04/11/24   Verified address: [address]   [place] [address]   Medication will be filled on 05.19.25.   This fill date is pending response to refill request from provider. Patient is aware and if they have not received fill by intended date they must follow up with pharmacy.

## 2024-04-10 ENCOUNTER — Inpatient Hospital Stay: Admitting: Oncology

## 2024-04-10 ENCOUNTER — Inpatient Hospital Stay: Attending: Oncology

## 2024-04-10 VITALS — BP 151/81 | HR 79 | Temp 99.2°F | Resp 16 | Ht 67.5 in | Wt 148.3 lb

## 2024-04-10 DIAGNOSIS — D7581 Myelofibrosis: Secondary | ICD-10-CM | POA: Diagnosis not present

## 2024-04-10 DIAGNOSIS — M898X9 Other specified disorders of bone, unspecified site: Secondary | ICD-10-CM | POA: Diagnosis not present

## 2024-04-10 DIAGNOSIS — Z853 Personal history of malignant neoplasm of breast: Secondary | ICD-10-CM | POA: Diagnosis not present

## 2024-04-10 DIAGNOSIS — D649 Anemia, unspecified: Secondary | ICD-10-CM | POA: Insufficient documentation

## 2024-04-10 DIAGNOSIS — C50912 Malignant neoplasm of unspecified site of left female breast: Secondary | ICD-10-CM

## 2024-04-10 LAB — CMP (CANCER CENTER ONLY)
ALT: 8 U/L (ref 0–44)
AST: 26 U/L (ref 15–41)
Albumin: 4.1 g/dL (ref 3.5–5.0)
Alkaline Phosphatase: 80 U/L (ref 38–126)
Anion gap: 12 (ref 5–15)
BUN: 16 mg/dL (ref 6–20)
CO2: 20 mmol/L — ABNORMAL LOW (ref 22–32)
Calcium: 8.9 mg/dL (ref 8.9–10.3)
Chloride: 107 mmol/L (ref 98–111)
Creatinine: 0.94 mg/dL (ref 0.44–1.00)
GFR, Estimated: >60 mL/min (ref >60)
Glucose, Bld: 103 mg/dL — ABNORMAL HIGH (ref 70–99)
Potassium: 3.9 mmol/L (ref 3.5–5.1)
Sodium: 139 mmol/L (ref 135–145)
Total Bilirubin: 0.3 mg/dL (ref 0.0–1.2)
Total Protein: 6.5 g/dL (ref 6.5–8.1)

## 2024-04-10 LAB — CBC WITH DIFFERENTIAL (CANCER CENTER ONLY)
Abs Immature Granulocytes: 2.38 10*3/uL — ABNORMAL HIGH (ref 0.00–0.07)
Basophils Absolute: 0.1 10*3/uL (ref 0.0–0.1)
Basophils Relative: 1 %
Eosinophils Absolute: 0.1 10*3/uL (ref 0.0–0.5)
Eosinophils Relative: 1 %
HCT: 27.7 % — ABNORMAL LOW (ref 36.0–46.0)
Hemoglobin: 8.5 g/dL — ABNORMAL LOW (ref 12.0–15.0)
Immature Granulocytes: 22 %
Lymphocytes Relative: 10 %
Lymphs Abs: 1.1 10*3/uL (ref 0.7–4.0)
MCH: 28.1 pg (ref 26.0–34.0)
MCHC: 30.7 g/dL (ref 30.0–36.0)
MCV: 91.7 fL (ref 80.0–100.0)
Monocytes Absolute: 1.2 10*3/uL — ABNORMAL HIGH (ref 0.1–1.0)
Monocytes Relative: 11 %
Neutro Abs: 5.9 10*3/uL (ref 1.7–7.7)
Neutrophils Relative %: 55 %
Platelet Count: 179 10*3/uL (ref 150–400)
RBC Morphology: NONE SEEN
RBC: 3.02 MIL/uL — ABNORMAL LOW (ref 3.87–5.11)
RDW: 18.3 % — ABNORMAL HIGH (ref 11.5–15.5)
WBC Count: 10.5 10*3/uL (ref 4.0–10.5)
nRBC: 2.5 % — ABNORMAL HIGH (ref 0.0–0.2)

## 2024-04-10 NOTE — Progress Notes (Signed)
 [facility] OFFICE PROGRESS NOTE   Diagnosis: Myelofibrosis, breast cancer  INTERVAL HISTORY:   [name] returns as scheduled.  She reports malaise.  She gets tired toward the end of the day.  Good appetite.  She takes ibuprofen  twice daily and 2-3 hydrocodone  tablets daily for relief of bone pain.  No nausea or abdominal pain. She had a negative right mammogram 02/25/2024. Objective:  Vital signs in last 24 hours:  Blood pressure (!) 151/81, pulse 79, temperature 99.2 F (37.3 C), temperature source Oral, resp. rate 16, height 5' 7.5" (1.715 m), weight 148 lb 4.8 oz (67.3 kg), SpO2 100%.    Lymphatics: No cervical, supraclavicular, or axillary nodes Resp: Lungs clear bilaterally Cardio: Regular rate and rhythm GI: The spleen is palpable throughout the left abdomen extending across the midline and below the umbilicus to the pelvis.  No hepatomegaly Vascular: No leg edema Breast: Left mastectomy with an implant in place, no evidence for chest wall tumor recurrence.  Right breast without mass.  Lab Results:  Lab Results  Component Value Date   WBC 10.5 04/10/2024   HGB 8.5 (L) 04/10/2024   HCT 27.7 (L) 04/10/2024   MCV 91.7 04/10/2024   PLT 179 04/10/2024   NEUTROABS PENDING 04/10/2024    CMP  Lab Results  Component Value Date   NA 139 04/10/2024   K 3.9 04/10/2024   CL 107 04/10/2024   CO2 20 (L) 04/10/2024   GLUCOSE 103 (H) 04/10/2024   BUN 16 04/10/2024   CREATININE 0.94 04/10/2024   CALCIUM 8.9 04/10/2024   PROT 6.5 04/10/2024   ALBUMIN 4.1 04/10/2024   AST 26 04/10/2024   ALT 8 04/10/2024   ALKPHOS 80 04/10/2024   BILITOT 0.3 04/10/2024   GFRNONAA >60 04/10/2024   GFRAA 59 (L) 06/12/2020     Medications: I have reviewed the patient's current medications.   Assessment/Plan: Stage II left-sided breast cancer diagnosed in February 2000. Screening mammogram 10/05/2011 with suggestion of a subcentimeter oval mass on the left superiorly and  no suspicious masses, calcifications or architectural distortion in the right breast. She underwent biopsy of the "asymmetric density of concern in the superior aspect of the right breast" on 10/13/2011. Pathology showed a fibroadenoma.   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 History of intermittent low-grade fever and "night sweats," likely systemic manifestations of myelofibrosis. History of Nose bleeding-likely related to myelofibrosis with dysfunctional platelets 7. 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   8. Elevated creatinine July 2015-normal 08/06/2014, potentially related to blood pressure medication   9.  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  Increased low back pain July 2020 MRI lumbar spine 06/26/2019- diffuse T1 signal compatible with myelofibrosis, hyperintense T2 signal at T12 and L3, most likely hemangiomas Bone scan 07/04/2019- diffuse increased and homogenous uptake compatible with myelofibrosis, no focal increased activity at T12 or  L3 11.  Exertional dyspnea-etiology unclear- evaluated by cardiology    Disposition: [name] has myelofibrosis.  She is symptomatic with bone pain and anemia.  We discussed the transplant option again  today.  She does not wish to consider transplant.  She would like to continue ruxolitinib  for now.  She will call for increased symptoms laded to anemia or splenomegaly.  She will continue ibuprofen  and hydrocodone  as needed. [name] is in clinical remission from breast cancer.  She will return for lab visit in 3 months and an office visit in 6 months.  We discussed potential treatment options for the anemia related to myelofibrosis including steroids (danazol), and thalidomide.  I do not recommend hydroxyurea  or radiation for treatment of the severe splenomegaly due to coexisting anemia.  Coni Deep, MD  04/10/2024  3:32 PM

## 2024-04-11 ENCOUNTER — Telehealth: Payer: Self-pay | Admitting: Oncology

## 2024-04-11 NOTE — Telephone Encounter (Signed)
 Patient has been scheduled for follow-up visit per 04/10/24 LOS.  LVM notifying pt of appt details, provided my direct number to pt if appt changes need to be made.

## 2024-04-13 ENCOUNTER — Other Ambulatory Visit (HOSPITAL_BASED_OUTPATIENT_CLINIC_OR_DEPARTMENT_OTHER): Payer: Self-pay

## 2024-04-13 ENCOUNTER — Other Ambulatory Visit: Payer: Self-pay

## 2024-04-14 ENCOUNTER — Other Ambulatory Visit (HOSPITAL_BASED_OUTPATIENT_CLINIC_OR_DEPARTMENT_OTHER): Payer: Self-pay

## 2024-04-14 ENCOUNTER — Other Ambulatory Visit: Payer: Self-pay

## 2024-04-24 ENCOUNTER — Other Ambulatory Visit: Payer: Self-pay | Admitting: Nurse Practitioner

## 2024-04-24 DIAGNOSIS — D7581 Myelofibrosis: Secondary | ICD-10-CM

## 2024-04-25 ENCOUNTER — Other Ambulatory Visit (HOSPITAL_BASED_OUTPATIENT_CLINIC_OR_DEPARTMENT_OTHER): Payer: Self-pay

## 2024-04-25 ENCOUNTER — Other Ambulatory Visit: Payer: Self-pay | Admitting: Nurse Practitioner

## 2024-04-25 DIAGNOSIS — D7581 Myelofibrosis: Secondary | ICD-10-CM

## 2024-04-25 MED ORDER — HYDROCODONE-ACETAMINOPHEN 5-325 MG PO TABS
1.0000 | ORAL_TABLET | ORAL | 0 refills | Status: DC | PRN
  Filled 2024-04-25: qty 100, 17d supply, fill #0

## 2024-04-26 ENCOUNTER — Other Ambulatory Visit (HOSPITAL_BASED_OUTPATIENT_CLINIC_OR_DEPARTMENT_OTHER): Payer: Self-pay

## 2024-05-02 ENCOUNTER — Ambulatory Visit: Payer: Medicaid Other | Admitting: Family Medicine

## 2024-05-02 ENCOUNTER — Other Ambulatory Visit (HOSPITAL_BASED_OUTPATIENT_CLINIC_OR_DEPARTMENT_OTHER): Payer: Self-pay

## 2024-05-02 ENCOUNTER — Encounter: Payer: Self-pay | Admitting: Family Medicine

## 2024-05-02 VITALS — BP 138/68 | HR 75 | Temp 97.7°F | Ht 67.5 in | Wt 145.8 lb

## 2024-05-02 DIAGNOSIS — F418 Other specified anxiety disorders: Secondary | ICD-10-CM

## 2024-05-02 DIAGNOSIS — F5101 Primary insomnia: Secondary | ICD-10-CM

## 2024-05-02 DIAGNOSIS — D7581 Myelofibrosis: Secondary | ICD-10-CM | POA: Diagnosis not present

## 2024-05-02 DIAGNOSIS — F411 Generalized anxiety disorder: Secondary | ICD-10-CM

## 2024-05-02 MED ORDER — ZOLPIDEM TARTRATE 10 MG PO TABS
ORAL_TABLET | ORAL | 5 refills | Status: DC
  Filled 2024-05-02 – 2024-05-10 (×2): qty 30, fill #0
  Filled 2024-05-11: qty 30, 30d supply, fill #0
  Filled 2024-06-08: qty 30, 30d supply, fill #1
  Filled 2024-07-07: qty 30, 30d supply, fill #2
  Filled 2024-08-07: qty 30, 30d supply, fill #3
  Filled 2024-09-04: qty 30, 30d supply, fill #4
  Filled 2024-10-02: qty 30, 30d supply, fill #5

## 2024-05-02 MED ORDER — IBUPROFEN 800 MG PO TABS
800.0000 mg | ORAL_TABLET | Freq: Two times a day (BID) | ORAL | 5 refills | Status: AC | PRN
  Filled 2024-05-02: qty 60, 30d supply, fill #0

## 2024-05-02 MED ORDER — ESCITALOPRAM OXALATE 20 MG PO TABS
20.0000 mg | ORAL_TABLET | Freq: Every day | ORAL | 3 refills | Status: AC
  Filled 2024-05-02 – 2024-06-08 (×2): qty 90, 90d supply, fill #0
  Filled 2024-09-21: qty 90, 90d supply, fill #1
  Filled 2024-12-20: qty 90, 90d supply, fill #2

## 2024-05-02 NOTE — Progress Notes (Signed)
 Phone [phone] In person visit   Subjective:   [name] is a 58 y.o. year old very pleasant female patient who presents for/with See problem oriented charting Chief Complaint  Patient presents with   Medical Management of Chronic Issues   Past Medical History-  Patient Active Problem List   Diagnosis Date Noted   Genetic testing 01/03/2024    Priority: High   History of Guillain-Barre syndrome 10/28/2021    Priority: High   Myelofibrosis (HCC) 02/23/2008    Priority: High   Insomnia 11/02/2022    Priority: Medium    GAD (generalized anxiety disorder) 05/08/2022    Priority: Medium    History of breast cancer     Priority: Medium    ATM positive 01/03/2024    Medications- reviewed and updated Current Outpatient Medications  Medication Sig Dispense Refill   ALPRAZolam  (XANAX ) 0.5 MG tablet Take 1 tablet (0.5 mg total) by mouth 2 (two) times daily as needed for anxiety 60 tablet 1   ruxolitinib  phosphate (JAKAFI ) 15 MG tablet TAKE 15 MG (1 TABLET) BY MOUTH ALTERNATING WITH 30 MG (2 TABLETS) DAILY 45 tablet 0   escitalopram  (LEXAPRO ) 20 MG tablet Take 1 tablet (20 mg total) by mouth daily. 90 tablet 3   HYDROcodone -acetaminophen  (NORCO/VICODIN) 5-325 MG tablet Take 1 tablet by mouth every 4 (four) hours as needed. (Patient not taking: Reported on 05/02/2024) 100 tablet 0   ibuprofen  (ADVIL ) 800 MG tablet Take 1 tablet (800 mg total) by mouth 2 (two) times daily as needed for pain. (Minimize use if possible- increases stomach bleeding risk, heart risk, and kidney function issues) 60 tablet 5   zolpidem  (AMBIEN ) 10 MG tablet Take 1 tablet (10 mg) by mouth at bedtime as needed for sleep. Try ONE-HALF tablet (5 mg) first. Do not take within 8 hours of alprazolam . Do not drive within 8 hours after taking. 30 tablet 5   No current facility-administered medications for this visit.     Objective:  BP 138/68   Pulse 75   Temp 97.7 F (36.5 C)   Ht 5' 7.5" (1.715 m)   Wt  145 lb 12.8 oz (66.1 kg)   SpO2 97%   BMI 22.50 kg/m  Gen: NAD, resting comfortably but appropriately tearful when discussing husbands situation     Assessment and Plan   # Elevated blood pressure reading-initial blood pressure mildly elevated but improved on repeat-patient reports blood pressure has been running somewhat higher with recent stressors-previously was close to 130s over 70s-we agreed to recheck at next visit-hopefully home situation will be more stable at that point  # Hyperlipidemia-last labs from 2022-she will see if oncology can add lipid panel to next labs-prefers not to do labs today here for that alone  # Myelofibrosis S: Medication: Ruxolitinib -hot flashes are better on this -Has chronic anemia related to this - Severe pain and remains on hydrocodone  related to this from oncology - We also prescribe ibuprofen  and 800 milligrams to be used up to twice a day-we monitor renal function every 6 months A/P: Myelofibrosis overall stable but with ongoing pain-I will refill ibuprofen  and hydrocodone  as prescribed by oncology  # Depression/anxiety/insomnia S: Medication:Lexapro  20 mg daily, Ambien  10 mg -Poor control on sertraline , Prozac  with nausea, citalopram  not very effective  Stressors: Husband no one palliative care and no other options left for his cancer, lost parents in 2023, chronic pain from myelofibrosis     05/02/2024    4:17 PM 11/02/2023  4:06 PM 02/01/2023    2:56 PM  Depression screen PHQ 2/9  Decreased Interest 1 1 2   Down, Depressed, Hopeless 1 1 2   PHQ - 2 Score 2 2 4   Altered sleeping 1 1 0  Tired, decreased energy 1 1 2   Change in appetite 0 1 0  Feeling bad or failure about yourself  0 1 0  Trouble concentrating 1 1 2   Moving slowly or fidgety/restless 0 0 0  Suicidal thoughts 0 0 0  PHQ-9 Score 5 7 8   Difficult doing work/chores Not difficult at all Not difficult at all Somewhat difficult      05/02/2024    4:17 PM 11/02/2023    4:06  PM 02/01/2023    2:56 PM 11/02/2022    8:46 AM  GAD 7 : Generalized Anxiety Score  Nervous, Anxious, on Edge 1 1 2 1   Control/stop worrying 1 2 2  0  Worry too much - different things 1 2 2 2   Trouble relaxing 1 2 2 2   Restless 1 2 2 2   Easily annoyed or irritable 0 2 2 1   Afraid - awful might happen 1 2 1  0  Total GAD 7 Score 6 13 13 8   Anxiety Difficulty Not difficult at all Not difficult at all Somewhat difficult Somewhat difficult   A/P: Given all that patient is facing right now as well as her family-I was that she is doing incredibly well all things considered-continue current medication  #colonoscopy- wants to do this but caring for husband with cancer and not the best time- discuss again next visit   Recommended follow up: Return in about 6 months (around 11/01/2024) for followup or sooner if needed.Schedule b4 you leave. Future Appointments  Date Time Provider Department Center  07/12/2024  8:30 AM [facility] PHLEBOTOMIST [facility] None  07/12/2024  8:50 AM Sumner Ends, MD [facility] None  11/01/2024  9:20 AM Almira Jaeger, MD [facility]    Lab/Order associations:   ICD-10-CM   1. Myelofibrosis (HCC)  D75.81     2. GAD (generalized anxiety disorder)  F41.1 zolpidem  (AMBIEN ) 10 MG tablet    3. Primary insomnia  F51.01       Meds ordered this encounter  Medications   zolpidem  (AMBIEN ) 10 MG tablet    Sig: Take 1 tablet (10 mg) by mouth at bedtime as needed for sleep. Try ONE-HALF tablet (5 mg) first. Do not take within 8 hours of alprazolam . Do not drive within 8 hours after taking.    Dispense:  30 tablet    Refill:  5    G47.00 - TAKE 1 TABLET AT BEDTIME AS NEEDED FOR SLEEP.TRY 1/2 TAB.NOT W/IN 8 HRS ALPRAZOLAM /DRIVE 8 HRS AFTERTAKING FOR INSOMNIA   ibuprofen  (ADVIL ) 800 MG tablet    Sig: Take 1 tablet (800 mg total) by mouth 2 (two) times daily as needed for pain. (Minimize use if possible- increases stomach bleeding risk, heart risk, and kidney function  issues)    Dispense:  60 tablet    Refill:  5   escitalopram  (LEXAPRO ) 20 MG tablet    Sig: Take 1 tablet (20 mg total) by mouth daily.    Dispense:  90 tablet    Refill:  3    Return precautions advised.  Clarisa Crooked, MD

## 2024-05-02 NOTE — Patient Instructions (Addendum)
 See if possible if they can add lipid panel under hypertriglyceridemia to next labs  No changes today- kidney function has looked good so refill medications for 6 months  Recommended follow up: Return in about 6 months (around 11/01/2024) for followup or sooner if needed.Schedule b4 you leave.

## 2024-05-10 ENCOUNTER — Encounter (INDEPENDENT_AMBULATORY_CARE_PROVIDER_SITE_OTHER): Payer: Self-pay

## 2024-05-10 ENCOUNTER — Other Ambulatory Visit: Payer: Self-pay | Admitting: Nurse Practitioner

## 2024-05-10 ENCOUNTER — Other Ambulatory Visit: Payer: Self-pay | Admitting: Oncology

## 2024-05-10 ENCOUNTER — Other Ambulatory Visit (HOSPITAL_BASED_OUTPATIENT_CLINIC_OR_DEPARTMENT_OTHER): Payer: Self-pay

## 2024-05-10 ENCOUNTER — Other Ambulatory Visit: Payer: Self-pay

## 2024-05-10 DIAGNOSIS — D7581 Myelofibrosis: Secondary | ICD-10-CM

## 2024-05-10 MED ORDER — RUXOLITINIB PHOSPHATE 15 MG PO TABS
ORAL_TABLET | ORAL | 1 refills | Status: DC
  Filled 2024-05-10: qty 45, fill #0
  Filled 2024-05-11: qty 45, 30d supply, fill #0
  Filled 2024-05-29: qty 45, 30d supply, fill #1

## 2024-05-11 ENCOUNTER — Other Ambulatory Visit: Payer: Self-pay

## 2024-05-11 ENCOUNTER — Other Ambulatory Visit (HOSPITAL_BASED_OUTPATIENT_CLINIC_OR_DEPARTMENT_OTHER): Payer: Self-pay

## 2024-05-11 ENCOUNTER — Other Ambulatory Visit (HOSPITAL_COMMUNITY): Payer: Self-pay

## 2024-05-11 NOTE — Progress Notes (Signed)
 Specialty Pharmacy Refill Coordination Note  [name] is a 58 y.o. female contacted today regarding refills of specialty medication(s) Ruxolitinib  Phosphate (JAKAFI )   Patient requested Delivery   Delivery date: 05/12/24   Verified address: [address]   Medication will be filled on 05/11/24.

## 2024-05-11 NOTE — Telephone Encounter (Signed)
 Forwarded to NP.

## 2024-05-12 ENCOUNTER — Other Ambulatory Visit: Payer: Self-pay | Admitting: Nurse Practitioner

## 2024-05-12 ENCOUNTER — Other Ambulatory Visit (HOSPITAL_BASED_OUTPATIENT_CLINIC_OR_DEPARTMENT_OTHER): Payer: Self-pay

## 2024-05-12 DIAGNOSIS — D7581 Myelofibrosis: Secondary | ICD-10-CM

## 2024-05-12 MED ORDER — HYDROCODONE-ACETAMINOPHEN 5-325 MG PO TABS
1.0000 | ORAL_TABLET | ORAL | 0 refills | Status: DC | PRN
  Filled 2024-05-12: qty 100, 17d supply, fill #0

## 2024-05-12 MED ORDER — ALPRAZOLAM 0.5 MG PO TABS
0.5000 mg | ORAL_TABLET | Freq: Two times a day (BID) | ORAL | 1 refills | Status: DC | PRN
  Filled 2024-05-12: qty 60, 30d supply, fill #0
  Filled 2024-06-08 (×2): qty 60, 30d supply, fill #1

## 2024-05-29 ENCOUNTER — Encounter (INDEPENDENT_AMBULATORY_CARE_PROVIDER_SITE_OTHER): Payer: Self-pay

## 2024-05-29 ENCOUNTER — Other Ambulatory Visit: Payer: Self-pay

## 2024-05-29 ENCOUNTER — Other Ambulatory Visit: Payer: Self-pay | Admitting: Pharmacy Technician

## 2024-05-29 NOTE — Progress Notes (Signed)
 Specialty Pharmacy Refill Coordination Note  [name] is a 58 y.o. female contacted today regarding refills of specialty medication(s) Ruxolitinib  Phosphate (JAKAFI )   Patient requested (Patient-Rptd) Delivery   Delivery date: 06/06/2024 Verified address: (Patient-Rptd) [address] [place] [address]   Medication will be filled on 06/05/2024.

## 2024-05-30 ENCOUNTER — Other Ambulatory Visit: Payer: Self-pay | Admitting: Nurse Practitioner

## 2024-05-30 ENCOUNTER — Other Ambulatory Visit (HOSPITAL_BASED_OUTPATIENT_CLINIC_OR_DEPARTMENT_OTHER): Payer: Self-pay

## 2024-05-30 DIAGNOSIS — D7581 Myelofibrosis: Secondary | ICD-10-CM

## 2024-05-30 MED ORDER — HYDROCODONE-ACETAMINOPHEN 5-325 MG PO TABS
1.0000 | ORAL_TABLET | ORAL | 0 refills | Status: DC | PRN
  Filled 2024-05-30: qty 100, 17d supply, fill #0

## 2024-06-08 ENCOUNTER — Other Ambulatory Visit (HOSPITAL_BASED_OUTPATIENT_CLINIC_OR_DEPARTMENT_OTHER): Payer: Self-pay

## 2024-06-08 ENCOUNTER — Other Ambulatory Visit: Payer: Self-pay

## 2024-06-15 ENCOUNTER — Other Ambulatory Visit: Payer: Self-pay | Admitting: Nurse Practitioner

## 2024-06-15 DIAGNOSIS — D7581 Myelofibrosis: Secondary | ICD-10-CM

## 2024-06-16 ENCOUNTER — Other Ambulatory Visit (HOSPITAL_BASED_OUTPATIENT_CLINIC_OR_DEPARTMENT_OTHER): Payer: Self-pay

## 2024-06-16 ENCOUNTER — Other Ambulatory Visit: Payer: Self-pay | Admitting: Nurse Practitioner

## 2024-06-16 DIAGNOSIS — D7581 Myelofibrosis: Secondary | ICD-10-CM

## 2024-06-16 MED ORDER — HYDROCODONE-ACETAMINOPHEN 5-325 MG PO TABS
1.0000 | ORAL_TABLET | ORAL | 0 refills | Status: DC | PRN
  Filled 2024-06-16: qty 100, 17d supply, fill #0

## 2024-06-17 ENCOUNTER — Other Ambulatory Visit (HOSPITAL_BASED_OUTPATIENT_CLINIC_OR_DEPARTMENT_OTHER): Payer: Self-pay

## 2024-06-22 ENCOUNTER — Other Ambulatory Visit: Payer: Self-pay

## 2024-06-28 ENCOUNTER — Other Ambulatory Visit: Payer: Self-pay | Admitting: Pharmacy Technician

## 2024-06-28 ENCOUNTER — Other Ambulatory Visit: Payer: Self-pay

## 2024-06-28 ENCOUNTER — Encounter (INDEPENDENT_AMBULATORY_CARE_PROVIDER_SITE_OTHER): Payer: Self-pay

## 2024-06-28 ENCOUNTER — Other Ambulatory Visit: Payer: Self-pay | Admitting: Oncology

## 2024-06-28 DIAGNOSIS — D7581 Myelofibrosis: Secondary | ICD-10-CM

## 2024-06-28 MED ORDER — RUXOLITINIB PHOSPHATE 15 MG PO TABS
ORAL_TABLET | ORAL | 1 refills | Status: DC
Start: 2024-06-28 — End: 2024-08-31
  Filled 2024-06-28: qty 45, 30d supply, fill #0
  Filled 2024-08-01: qty 45, 30d supply, fill #1

## 2024-06-28 NOTE — Progress Notes (Signed)
 Specialty Pharmacy Refill Coordination Note  [name] is a 58 y.o. female contacted today regarding refills of specialty medication(s) Ruxolitinib  Phosphate (JAKAFI )   Patient requested (Patient-Rptd) Delivery   Delivery date: 07/06/24 Verified address: (Patient-Rptd) [address] [place] [address]   Medication will be filled on 07/05/24.    RR sent to MD

## 2024-06-29 ENCOUNTER — Other Ambulatory Visit: Payer: Self-pay

## 2024-07-03 ENCOUNTER — Other Ambulatory Visit: Payer: Self-pay | Admitting: Nurse Practitioner

## 2024-07-03 DIAGNOSIS — D7581 Myelofibrosis: Secondary | ICD-10-CM

## 2024-07-03 NOTE — Telephone Encounter (Signed)
 Forwarded to NP.

## 2024-07-04 ENCOUNTER — Other Ambulatory Visit (HOSPITAL_COMMUNITY): Payer: Self-pay

## 2024-07-04 ENCOUNTER — Other Ambulatory Visit: Payer: Self-pay | Admitting: Nurse Practitioner

## 2024-07-04 ENCOUNTER — Other Ambulatory Visit (HOSPITAL_BASED_OUTPATIENT_CLINIC_OR_DEPARTMENT_OTHER): Payer: Self-pay

## 2024-07-04 DIAGNOSIS — D7581 Myelofibrosis: Secondary | ICD-10-CM

## 2024-07-04 MED ORDER — HYDROCODONE-ACETAMINOPHEN 5-325 MG PO TABS
1.0000 | ORAL_TABLET | ORAL | 0 refills | Status: DC | PRN
  Filled 2024-07-04: qty 100, 17d supply, fill #0

## 2024-07-04 NOTE — Progress Notes (Signed)
 Specialty Pharmacy Ongoing Clinical Assessment Note  [name] is a 58 y.o. female who is being followed by the specialty pharmacy service for RxSp Bleeding Disorders   Patient's specialty medication(s) reviewed today: Ruxolitinib  Phosphate (JAKAFI )   Missed doses in the last 4 weeks: 0   Patient/Caregiver did not have any additional questions or concerns.   Therapeutic benefit summary: Patient is achieving benefit   Adverse events/side effects summary: Experienced adverse events/side effects (mild, tolerable fatigue)   Patient's therapy is appropriate to: Continue    Goals Addressed             This Visit's Progress    Stabilization of disease   On track    Patient is on track. Patient will maintain adherence         Follow up: 6 months  Silvano LOISE Dolly Specialty Pharmacist

## 2024-07-05 ENCOUNTER — Other Ambulatory Visit: Payer: Self-pay

## 2024-07-07 ENCOUNTER — Other Ambulatory Visit: Payer: Self-pay | Admitting: Nurse Practitioner

## 2024-07-07 ENCOUNTER — Other Ambulatory Visit (HOSPITAL_BASED_OUTPATIENT_CLINIC_OR_DEPARTMENT_OTHER): Payer: Self-pay

## 2024-07-07 ENCOUNTER — Other Ambulatory Visit: Payer: Self-pay

## 2024-07-07 DIAGNOSIS — D7581 Myelofibrosis: Secondary | ICD-10-CM

## 2024-07-07 MED ORDER — ALPRAZOLAM 0.5 MG PO TABS
0.5000 mg | ORAL_TABLET | Freq: Two times a day (BID) | ORAL | 1 refills | Status: DC | PRN
  Filled 2024-07-07: qty 60, 30d supply, fill #0
  Filled 2024-08-07: qty 60, 30d supply, fill #1

## 2024-07-12 ENCOUNTER — Ambulatory Visit: Admitting: Oncology

## 2024-07-12 ENCOUNTER — Other Ambulatory Visit

## 2024-07-14 ENCOUNTER — Ambulatory Visit: Admitting: Oncology

## 2024-07-14 ENCOUNTER — Other Ambulatory Visit

## 2024-07-20 ENCOUNTER — Other Ambulatory Visit: Payer: Self-pay | Admitting: Nurse Practitioner

## 2024-07-20 DIAGNOSIS — D7581 Myelofibrosis: Secondary | ICD-10-CM

## 2024-07-20 NOTE — Telephone Encounter (Signed)
 Forwarded to NP.

## 2024-07-21 ENCOUNTER — Other Ambulatory Visit (HOSPITAL_BASED_OUTPATIENT_CLINIC_OR_DEPARTMENT_OTHER): Payer: Self-pay

## 2024-07-21 ENCOUNTER — Other Ambulatory Visit: Payer: Self-pay | Admitting: Nurse Practitioner

## 2024-07-21 DIAGNOSIS — D7581 Myelofibrosis: Secondary | ICD-10-CM

## 2024-07-21 MED ORDER — HYDROCODONE-ACETAMINOPHEN 5-325 MG PO TABS
1.0000 | ORAL_TABLET | ORAL | 0 refills | Status: DC | PRN
  Filled 2024-07-21: qty 100, 17d supply, fill #0

## 2024-07-28 ENCOUNTER — Other Ambulatory Visit (HOSPITAL_COMMUNITY): Payer: Self-pay

## 2024-08-01 ENCOUNTER — Other Ambulatory Visit: Payer: Self-pay

## 2024-08-03 ENCOUNTER — Other Ambulatory Visit: Payer: Self-pay

## 2024-08-03 ENCOUNTER — Other Ambulatory Visit: Payer: Self-pay | Admitting: Pharmacy Technician

## 2024-08-03 NOTE — Progress Notes (Signed)
 Specialty Pharmacy Refill Coordination Note  [name] is a 58 y.o. female contacted today regarding refills of specialty medication(s) Ruxolitinib  Phosphate (JAKAFI )   Patient requested Delivery   Delivery date: 08/09/24   Verified address: [address] [place]    Medication will be filled on 08/08/24.

## 2024-08-07 ENCOUNTER — Other Ambulatory Visit (HOSPITAL_BASED_OUTPATIENT_CLINIC_OR_DEPARTMENT_OTHER): Payer: Self-pay

## 2024-08-07 ENCOUNTER — Other Ambulatory Visit: Payer: Self-pay

## 2024-08-07 ENCOUNTER — Other Ambulatory Visit: Payer: Self-pay | Admitting: Nurse Practitioner

## 2024-08-07 DIAGNOSIS — D7581 Myelofibrosis: Secondary | ICD-10-CM

## 2024-08-08 ENCOUNTER — Other Ambulatory Visit (HOSPITAL_BASED_OUTPATIENT_CLINIC_OR_DEPARTMENT_OTHER): Payer: Self-pay

## 2024-08-08 ENCOUNTER — Other Ambulatory Visit: Payer: Self-pay | Admitting: Nurse Practitioner

## 2024-08-08 ENCOUNTER — Other Ambulatory Visit: Payer: Self-pay

## 2024-08-08 DIAGNOSIS — D7581 Myelofibrosis: Secondary | ICD-10-CM

## 2024-08-08 MED ORDER — HYDROCODONE-ACETAMINOPHEN 5-325 MG PO TABS
1.0000 | ORAL_TABLET | ORAL | 0 refills | Status: DC | PRN
  Filled 2024-08-08: qty 100, 17d supply, fill #0

## 2024-08-08 NOTE — Telephone Encounter (Signed)
 Forwarded to NP.

## 2024-08-21 ENCOUNTER — Inpatient Hospital Stay

## 2024-08-21 ENCOUNTER — Telehealth: Payer: Self-pay | Admitting: Oncology

## 2024-08-21 ENCOUNTER — Inpatient Hospital Stay: Admitting: Oncology

## 2024-08-21 NOTE — Telephone Encounter (Signed)
 PT is at the ER with her husband, rescheduled appt time and day.

## 2024-08-25 ENCOUNTER — Encounter: Payer: Self-pay | Admitting: Family Medicine

## 2024-08-25 ENCOUNTER — Encounter: Payer: Self-pay | Admitting: *Deleted

## 2024-08-25 ENCOUNTER — Other Ambulatory Visit: Payer: Self-pay | Admitting: *Deleted

## 2024-08-25 DIAGNOSIS — E78019 Familial hypercholesterolemia, unspecified: Secondary | ICD-10-CM

## 2024-08-25 NOTE — Progress Notes (Signed)
 Dr. Carolee office requesting lipid panel at next visit. Dx: Z21.4

## 2024-08-29 ENCOUNTER — Telehealth: Payer: Self-pay | Admitting: Oncology

## 2024-08-31 ENCOUNTER — Other Ambulatory Visit: Payer: Self-pay | Admitting: Oncology

## 2024-08-31 ENCOUNTER — Other Ambulatory Visit: Payer: Self-pay

## 2024-08-31 ENCOUNTER — Other Ambulatory Visit: Payer: Self-pay | Admitting: Nurse Practitioner

## 2024-08-31 DIAGNOSIS — D7581 Myelofibrosis: Secondary | ICD-10-CM

## 2024-08-31 MED ORDER — RUXOLITINIB PHOSPHATE 15 MG PO TABS
ORAL_TABLET | ORAL | 1 refills | Status: DC
  Filled 2024-08-31: qty 45, fill #0
  Filled 2024-09-04: qty 45, 30d supply, fill #0
  Filled 2024-09-28: qty 45, 30d supply, fill #1

## 2024-09-01 ENCOUNTER — Inpatient Hospital Stay

## 2024-09-01 ENCOUNTER — Other Ambulatory Visit: Payer: Self-pay | Admitting: Nurse Practitioner

## 2024-09-01 ENCOUNTER — Other Ambulatory Visit (HOSPITAL_BASED_OUTPATIENT_CLINIC_OR_DEPARTMENT_OTHER): Payer: Self-pay

## 2024-09-01 ENCOUNTER — Inpatient Hospital Stay: Admitting: Oncology

## 2024-09-01 DIAGNOSIS — D7581 Myelofibrosis: Secondary | ICD-10-CM

## 2024-09-01 MED ORDER — HYDROCODONE-ACETAMINOPHEN 5-325 MG PO TABS
1.0000 | ORAL_TABLET | ORAL | 0 refills | Status: DC | PRN
  Filled 2024-09-01: qty 100, 17d supply, fill #0

## 2024-09-04 ENCOUNTER — Other Ambulatory Visit: Payer: Self-pay

## 2024-09-04 ENCOUNTER — Other Ambulatory Visit: Payer: Self-pay | Admitting: Oncology

## 2024-09-04 ENCOUNTER — Other Ambulatory Visit (HOSPITAL_BASED_OUTPATIENT_CLINIC_OR_DEPARTMENT_OTHER): Payer: Self-pay

## 2024-09-04 ENCOUNTER — Other Ambulatory Visit: Payer: Self-pay | Admitting: Nurse Practitioner

## 2024-09-04 DIAGNOSIS — D7581 Myelofibrosis: Secondary | ICD-10-CM

## 2024-09-04 MED ORDER — ALPRAZOLAM 0.5 MG PO TABS
0.5000 mg | ORAL_TABLET | Freq: Two times a day (BID) | ORAL | 1 refills | Status: DC | PRN
  Filled 2024-09-04: qty 60, 30d supply, fill #0
  Filled 2024-10-02: qty 60, 30d supply, fill #1

## 2024-09-04 NOTE — Progress Notes (Signed)
 Specialty Pharmacy Refill Coordination Note  [name] is a 58 y.o. female contacted today regarding refills of specialty medication(s) Ruxolitinib  Phosphate (JAKAFI )   Patient requested Delivery   Delivery date: 09/06/24   Verified address: [address] [place]   Medication will be filled on 09/05/24.

## 2024-09-05 ENCOUNTER — Inpatient Hospital Stay: Attending: Oncology

## 2024-09-05 ENCOUNTER — Inpatient Hospital Stay: Admitting: Oncology

## 2024-09-05 VITALS — BP 145/71 | HR 81 | Temp 98.0°F | Resp 16 | Ht 67.5 in | Wt 137.3 lb

## 2024-09-05 DIAGNOSIS — C50912 Malignant neoplasm of unspecified site of left female breast: Secondary | ICD-10-CM | POA: Diagnosis not present

## 2024-09-05 DIAGNOSIS — D696 Thrombocytopenia, unspecified: Secondary | ICD-10-CM | POA: Insufficient documentation

## 2024-09-05 DIAGNOSIS — R161 Splenomegaly, not elsewhere classified: Secondary | ICD-10-CM | POA: Insufficient documentation

## 2024-09-05 DIAGNOSIS — D7581 Myelofibrosis: Secondary | ICD-10-CM | POA: Diagnosis not present

## 2024-09-05 DIAGNOSIS — E78019 Familial hypercholesterolemia, unspecified: Secondary | ICD-10-CM

## 2024-09-05 DIAGNOSIS — D649 Anemia, unspecified: Secondary | ICD-10-CM | POA: Diagnosis not present

## 2024-09-05 DIAGNOSIS — Z853 Personal history of malignant neoplasm of breast: Secondary | ICD-10-CM | POA: Diagnosis not present

## 2024-09-05 LAB — CBC WITH DIFFERENTIAL (CANCER CENTER ONLY)
Abs Immature Granulocytes: 0.5 K/uL — ABNORMAL HIGH (ref 0.00–0.07)
Band Neutrophils: 5 %
Basophils Absolute: 0 K/uL (ref 0.0–0.1)
Basophils Relative: 0 %
Eosinophils Absolute: 0 K/uL (ref 0.0–0.5)
Eosinophils Relative: 0 %
HCT: 25.3 % — ABNORMAL LOW (ref 36.0–46.0)
Hemoglobin: 7.9 g/dL — ABNORMAL LOW (ref 12.0–15.0)
Lymphocytes Relative: 27 %
Lymphs Abs: 2.3 K/uL (ref 0.7–4.0)
MCH: 27.7 pg (ref 26.0–34.0)
MCHC: 31.2 g/dL (ref 30.0–36.0)
MCV: 88.8 fL (ref 80.0–100.0)
Metamyelocytes Relative: 1 %
Monocytes Absolute: 0.3 K/uL (ref 0.1–1.0)
Monocytes Relative: 4 %
Myelocytes: 5 %
Neutro Abs: 5.4 K/uL (ref 1.7–7.7)
Neutrophils Relative %: 58 %
Platelet Count: 142 K/uL — ABNORMAL LOW (ref 150–400)
RBC: 2.85 MIL/uL — ABNORMAL LOW (ref 3.87–5.11)
RDW: 18.9 % — ABNORMAL HIGH (ref 11.5–15.5)
WBC Count: 8.5 K/uL (ref 4.0–10.5)
nRBC: 1.6 % — ABNORMAL HIGH (ref 0.0–0.2)

## 2024-09-05 LAB — CMP (CANCER CENTER ONLY)
ALT: 8 U/L (ref 0–44)
AST: 22 U/L (ref 15–41)
Albumin: 4.3 g/dL (ref 3.5–5.0)
Alkaline Phosphatase: 68 U/L (ref 38–126)
Anion gap: 11 (ref 5–15)
BUN: 15 mg/dL (ref 6–20)
CO2: 21 mmol/L — ABNORMAL LOW (ref 22–32)
Calcium: 9.2 mg/dL (ref 8.9–10.3)
Chloride: 105 mmol/L (ref 98–111)
Creatinine: 0.8 mg/dL (ref 0.44–1.00)
GFR, Estimated: >60 mL/min (ref >60)
Glucose, Bld: 88 mg/dL (ref 70–99)
Potassium: 4.2 mmol/L (ref 3.5–5.1)
Sodium: 138 mmol/L (ref 135–145)
Total Bilirubin: 0.4 mg/dL (ref 0.0–1.2)
Total Protein: 6.6 g/dL (ref 6.5–8.1)

## 2024-09-05 LAB — LIPID PANEL
Cholesterol: 69 mg/dL (ref 0–200)
HDL: 18 mg/dL — ABNORMAL LOW (ref >40)
LDL Cholesterol: 20 mg/dL (ref 0–99)
Total CHOL/HDL Ratio: 3.8 ratio
Triglycerides: 154 mg/dL — ABNORMAL HIGH (ref <150)
VLDL: 31 mg/dL (ref 0–40)

## 2024-09-05 LAB — SAMPLE TO BLOOD BANK

## 2024-09-05 NOTE — Progress Notes (Signed)
  Cancer Center OFFICE PROGRESS NOTE   Diagnosis: Myelofibrosis, breast cancer  INTERVAL HISTORY:   [name] returns for a scheduled visit.  She continues ruxolitinib .  No fever or, nausea, or night sweats.  She reports stable splenomegaly.  She continues to have bone pain.  She uses ibuprofen  and hydrocodone  as needed.  She reports a good appetite.  She relates weight loss to stress .  Her husband is ill and has enrolled in hospice care. She has mild exertional dyspnea.  Objective:  Vital signs in last 24 hours:  Blood pressure (!) 145/71, pulse 81, temperature 98 F (36.7 C), temperature source Temporal, resp. rate 16, height 5' 7.5 (1.715 m), weight 137 lb 4.8 oz (62.3 kg), SpO2 100%.    HEENT: No thrush or ulcers Resp: Lungs clear bilaterally Cardio: Regular rate and rhythm GI: The spleen is palpable throughout the left abdomen extending to the pelvic brim and to the right of midline, no hepatomegaly Vascular: No leg edema   Lab Results:  Lab Results  Component Value Date   WBC 8.5 09/05/2024   HGB 7.9 (L) 09/05/2024   HCT 25.3 (L) 09/05/2024   MCV 88.8 09/05/2024   PLT PENDING 09/05/2024   NEUTROABS PENDING 09/05/2024    CMP  Lab Results  Component Value Date   NA 139 04/10/2024   K 3.9 04/10/2024   CL 107 04/10/2024   CO2 20 (L) 04/10/2024   GLUCOSE 103 (H) 04/10/2024   BUN 16 04/10/2024   CREATININE 0.94 04/10/2024   CALCIUM 8.9 04/10/2024   PROT 6.5 04/10/2024   ALBUMIN 4.1 04/10/2024   AST 26 04/10/2024   ALT 8 04/10/2024   ALKPHOS 80 04/10/2024   BILITOT 0.3 04/10/2024   GFRNONAA >60 04/10/2024   GFRAA 59 (L) 06/12/2020    Medications: I have reviewed the patient's current medications.   Assessment/Plan: Stage II left-sided breast cancer diagnosed in February 2000. Screening mammogram 10/05/2011 with suggestion of a subcentimeter oval mass on the left superiorly and no suspicious masses, calcifications or architectural  distortion in the right breast. She underwent biopsy of the asymmetric density of concern in the superior aspect of the right breast on 10/13/2011. Pathology showed a fibroadenoma.   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 History of intermittent low-grade fever and night sweats, likely systemic manifestations of myelofibrosis. History of Nose bleeding-likely related to myelofibrosis with dysfunctional platelets 7. 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   8. Elevated creatinine July 2015-normal 08/06/2014, potentially related to blood pressure medication   9.  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  Increased low back pain July 2020 MRI lumbar spine 06/26/2019- diffuse T1 signal compatible with myelofibrosis, hyperintense T2 signal at T12 and L3, most likely hemangiomas Bone scan 07/04/2019- diffuse increased and homogenous uptake compatible with myelofibrosis, no focal increased activity at T12 or L3 11.  Exertional  dyspnea-etiology unclear- evaluated by cardiology      Disposition: [name] has myelofibrosis.  Anemia has progressed over the past year.  She appears to have mild symptoms related to anemia.  We discussed changing to a different  treatment.  She would like to continue the ruxolitinib  for now.  We have discussed danazol and thalidomide in the past.  We also discussed red cell transfusion support.  She does not want a transfusion at present.  She confirmed her decision against a bone marrow transplant referral.  [name] agrees to a 58-month follow-up visit.  She will call in the interim as needed.  Arley Hof, MD  09/05/2024  9:09 AM

## 2024-09-21 ENCOUNTER — Other Ambulatory Visit: Payer: Self-pay | Admitting: Nurse Practitioner

## 2024-09-21 ENCOUNTER — Other Ambulatory Visit (HOSPITAL_BASED_OUTPATIENT_CLINIC_OR_DEPARTMENT_OTHER): Payer: Self-pay

## 2024-09-21 DIAGNOSIS — D7581 Myelofibrosis: Secondary | ICD-10-CM

## 2024-09-21 NOTE — Telephone Encounter (Signed)
Sent to NP 

## 2024-09-22 ENCOUNTER — Other Ambulatory Visit (HOSPITAL_BASED_OUTPATIENT_CLINIC_OR_DEPARTMENT_OTHER): Payer: Self-pay

## 2024-09-22 ENCOUNTER — Other Ambulatory Visit: Payer: Self-pay | Admitting: Nurse Practitioner

## 2024-09-22 DIAGNOSIS — D7581 Myelofibrosis: Secondary | ICD-10-CM

## 2024-09-22 MED ORDER — HYDROCODONE-ACETAMINOPHEN 5-325 MG PO TABS
1.0000 | ORAL_TABLET | ORAL | 0 refills | Status: DC | PRN
  Filled 2024-09-22: qty 100, 17d supply, fill #0

## 2024-09-25 ENCOUNTER — Other Ambulatory Visit (HOSPITAL_BASED_OUTPATIENT_CLINIC_OR_DEPARTMENT_OTHER): Payer: Self-pay

## 2024-09-26 ENCOUNTER — Other Ambulatory Visit: Payer: Self-pay

## 2024-09-26 ENCOUNTER — Encounter: Payer: Self-pay | Admitting: *Deleted

## 2024-09-26 ENCOUNTER — Other Ambulatory Visit (HOSPITAL_COMMUNITY): Payer: Self-pay

## 2024-09-26 NOTE — Progress Notes (Unsigned)
 After speaking with pharmacy, [facility] reports her hydrocodone /apap needs a prior authorization. Faxed request to oral drug PA team [phone]

## 2024-09-28 ENCOUNTER — Other Ambulatory Visit (HOSPITAL_COMMUNITY): Payer: Self-pay

## 2024-09-28 ENCOUNTER — Telehealth (HOSPITAL_COMMUNITY): Payer: Self-pay | Admitting: Pharmacy Technician

## 2024-09-28 ENCOUNTER — Other Ambulatory Visit (HOSPITAL_BASED_OUTPATIENT_CLINIC_OR_DEPARTMENT_OTHER): Payer: Self-pay

## 2024-09-28 NOTE — Telephone Encounter (Signed)
 Oral Oncology Patient Advocate Encounter  Faxed approval has been attached to patient's chart.  Adja Ruff (Patty) Chet Burnet, CPhT  [facility], Zelda Salmon, [facility] - Oral Chemotherapy Patient Advocate Specialist III Phone: [phone]  Fax: [phone]

## 2024-09-28 NOTE — Telephone Encounter (Signed)
 Pharmacy Patient Advocate Encounter   Received notification from Pt Calls Messages that prior authorization for HYDROcodone -Acetaminophen  5-325MG  tablets  is required/requested.   Insurance verification completed.   The patient is insured through [employer].   Per test claim: PA required; PA submitted to above mentioned insurance via Latent Key/confirmation #/EOC [id number] Status is pending

## 2024-09-28 NOTE — Telephone Encounter (Signed)
 Pharmacy Patient Advocate Encounter  Received notification from [employer] that Prior Authorization for HYDROcodone -Acetaminophen  5-325MG  tablets  has been APPROVED from 09/28/2024 to 03/27/2025. Ran test claim, Copay is $4.00. This test claim was processed through [facility]- copay amounts may vary at other pharmacies due to pharmacy/plan contracts, or as the patient moves through the different stages of their insurance plan.   PA #/Case ID/Reference #: [id number]

## 2024-09-29 ENCOUNTER — Telehealth: Payer: Self-pay | Admitting: Family Medicine

## 2024-09-29 ENCOUNTER — Encounter: Payer: Self-pay | Admitting: Family Medicine

## 2024-09-29 NOTE — Telephone Encounter (Signed)
 Noted. Patient seeing Gi on Monday.    Copied from CRM #[id number]. Topic: Referral - Request for Referral >> Sep 29, 2024 12:27 PM Berneda FALCON wrote: Did the patient discuss referral with their provider in the last year? Yes (If No - schedule appointment) (If Yes - send message)  Appointment offered? No  Type of order/referral and detailed reason for visit: Rectal bleeding, PCP recommended  Preference of office, provider, location: [facility] Mariellen Alan Coombs Fax is [phone]  If referral order, have you been seen by this specialty before? Yes (If Yes, this issue or another issue? When? Where? Back in 2016, not sure where  Can we respond through MyChart? Yes

## 2024-09-29 NOTE — Telephone Encounter (Signed)
 Patient is scheduled to see GI on Monday

## 2024-10-02 ENCOUNTER — Encounter: Payer: Self-pay | Admitting: Physician Assistant

## 2024-10-02 ENCOUNTER — Other Ambulatory Visit: Payer: Self-pay

## 2024-10-02 ENCOUNTER — Encounter (INDEPENDENT_AMBULATORY_CARE_PROVIDER_SITE_OTHER): Payer: Self-pay

## 2024-10-02 ENCOUNTER — Ambulatory Visit: Admitting: Physician Assistant

## 2024-10-02 ENCOUNTER — Other Ambulatory Visit (INDEPENDENT_AMBULATORY_CARE_PROVIDER_SITE_OTHER)

## 2024-10-02 ENCOUNTER — Other Ambulatory Visit (HOSPITAL_BASED_OUTPATIENT_CLINIC_OR_DEPARTMENT_OTHER): Payer: Self-pay

## 2024-10-02 VITALS — BP 136/64 | HR 86 | Ht 67.0 in | Wt 137.4 lb

## 2024-10-02 DIAGNOSIS — D649 Anemia, unspecified: Secondary | ICD-10-CM

## 2024-10-02 DIAGNOSIS — K625 Hemorrhage of anus and rectum: Secondary | ICD-10-CM | POA: Diagnosis not present

## 2024-10-02 DIAGNOSIS — Z8669 Personal history of other diseases of the nervous system and sense organs: Secondary | ICD-10-CM

## 2024-10-02 DIAGNOSIS — D7581 Myelofibrosis: Secondary | ICD-10-CM

## 2024-10-02 DIAGNOSIS — R195 Other fecal abnormalities: Secondary | ICD-10-CM

## 2024-10-02 DIAGNOSIS — Z1501 Genetic susceptibility to malignant neoplasm of breast: Secondary | ICD-10-CM | POA: Diagnosis not present

## 2024-10-02 DIAGNOSIS — Z853 Personal history of malignant neoplasm of breast: Secondary | ICD-10-CM | POA: Diagnosis not present

## 2024-10-02 DIAGNOSIS — Z1509 Genetic susceptibility to other malignant neoplasm: Secondary | ICD-10-CM | POA: Diagnosis not present

## 2024-10-02 LAB — CBC WITH DIFFERENTIAL/PLATELET
Basophils Absolute: 0.1 K/uL (ref 0.0–0.1)
Basophils Relative: 0.7 % (ref 0.0–3.0)
Eosinophils Absolute: 0.1 K/uL (ref 0.0–0.7)
Eosinophils Relative: 0.6 % (ref 0.0–5.0)
HCT: 26.3 % — ABNORMAL LOW (ref 36.0–46.0)
Hemoglobin: 8.5 g/dL — ABNORMAL LOW (ref 12.0–15.0)
Lymphocytes Relative: 16 % (ref 12.0–46.0)
Lymphs Abs: 1.8 K/uL (ref 0.7–4.0)
MCHC: 32.3 g/dL (ref 30.0–36.0)
MCV: 86 fl (ref 78.0–100.0)
Monocytes Absolute: 1.3 K/uL — ABNORMAL HIGH (ref 0.1–1.0)
Monocytes Relative: 11.7 % (ref 3.0–12.0)
Neutro Abs: 7.9 K/uL — ABNORMAL HIGH (ref 1.4–7.7)
Neutrophils Relative %: 71 % (ref 43.0–77.0)
Platelets: 141 K/uL — ABNORMAL LOW (ref 150.0–400.0)
RBC: 3.06 Mil/uL — ABNORMAL LOW (ref 3.87–5.11)
RDW: 19.4 % — ABNORMAL HIGH (ref 11.5–15.5)
WBC: 11.1 K/uL — ABNORMAL HIGH (ref 4.0–10.5)

## 2024-10-02 LAB — COMPREHENSIVE METABOLIC PANEL WITH GFR
ALT: 9 U/L (ref 0–35)
AST: 14 U/L (ref 0–37)
Albumin: 4.5 g/dL (ref 3.5–5.2)
Alkaline Phosphatase: 66 U/L (ref 39–117)
BUN: 16 mg/dL (ref 6–23)
CO2: 21 meq/L (ref 19–32)
Calcium: 8.4 mg/dL (ref 8.4–10.5)
Chloride: 107 meq/L (ref 96–112)
Creatinine, Ser: 0.92 mg/dL (ref 0.40–1.20)
GFR: 67.56 mL/min (ref >60.00)
Glucose, Bld: 93 mg/dL (ref 70–99)
Potassium: 4 meq/L (ref 3.5–5.1)
Sodium: 137 meq/L (ref 135–145)
Total Bilirubin: 0.4 mg/dL (ref 0.2–1.2)
Total Protein: 6.6 g/dL (ref 6.0–8.3)

## 2024-10-02 LAB — IBC + FERRITIN
Ferritin: 118.6 ng/mL (ref 10.0–291.0)
Iron: 52 ug/dL (ref 42–145)
Saturation Ratios: 19.1 % — ABNORMAL LOW (ref 20.0–50.0)
TIBC: 271.6 ug/dL (ref 250.0–450.0)
Transferrin: 194 mg/dL — ABNORMAL LOW (ref 212.0–360.0)

## 2024-10-02 LAB — TSH: TSH: 1.78 u[IU]/mL (ref 0.35–5.50)

## 2024-10-02 LAB — VITAMIN B12: Vitamin B-12: 289 pg/mL (ref 211–911)

## 2024-10-02 MED ORDER — NA SULFATE-K SULFATE-MG SULF 17.5-3.13-1.6 GM/177ML PO SOLN
1.0000 | Freq: Once | ORAL | 0 refills | Status: AC
  Filled 2024-10-02: qty 354, 1d supply, fill #0

## 2024-10-02 NOTE — Progress Notes (Signed)
 Agree with the assessment and plan as outlined by Alan Coombs, PA-C.  Yes, significant splenomegaly could place her at elevated risk for splenic injury and I recommend repeat CT prior to scheduling colonoscopy.  This would also give us  an idea of any gastric or esophageal varices which may necessitate upper endoscopy as well.  Davell Beckstead, DO, [facility]

## 2024-10-02 NOTE — Progress Notes (Addendum)
 10/02/2024 [name] [id number] Feb 20, 1966  Referring provider: Katrinka Garnette KIDD, MD Primary GI doctor: Dr. San  ASSESSMENT AND PLAN:  Rectal bleeding History of colonoscopy 08/13/2015, recall 2021  unable to see report but per patient benign polyps Light red blood on TP only after BM Single episode of pale red blood post-bowel movement. Differential includes hemorrhoids. Further evaluation warranted due to history of benign polyps and family history of colon cancer. - Scheduled colonoscopy on December 8th, 2025, We have discussed the risks of bleeding, infection, perforation, medication reactions, and remote risk of death associated with colonoscopy. All questions were answered and the patient acknowledges these risk and wishes to proceed.  Loose stools with urgency x 1 years 2-3 Bm's a day, no nocturnal symptoms Has had weight loss 20 lbs since Dec but blames this on stress from taking care of her husband who passed 3 weeks go from lung cancer Denies gas/bloating - Increase dietary fiber intake. - Consider FODMAP diet to identify potential trigger foods. - check celiac - consider biopsy rule out microscopic colitis  Myelofibrosis Marked splenomegaly last CT 2017 measured up to 23 cm ? Need repeat imaging? Uncertain if splenomegaly will affect the colonoscopy but will discuss with Dr. San [facility] with Cancer center Mild thrombocytpenia, ? Need for EGD  Anemia Likely due to myelofibrosis  - will check CBC, iron ,ferritin, B12 -consider check CBC 1-2 days before procedure.   Maternal aunt with history of colon cancer in 30s  Patient with personal history of ATM heterozygote/bard 1  Personal history of breast cancer Status post left mastectomy 2000  Patient Care Team: Katrinka Garnette KIDD, MD as PCP - General (Family Medicine)  HISTORY OF PRESENT ILLNESS: 58 y.o. female with a past medical history listed below presents for evaluation of rectal  bleeding.   [facility], had recall colonoscopy 2021  Discussed the use of AI scribe software for clinical note transcription with the patient, who gave verbal consent to proceed.  History of Present Illness   [name] is a 58 year old female with a history of breast cancer and familial colon cancer who presents with rectal bleeding and loose stools.  She experienced a recent episode of rectal bleeding, which occurred once after a bowel movement. The blood was described as pale red and was noted on the tissue. There is no prior history of rectal bleeding.  She has had loose stools for approximately four years, with an increase in frequency to two to three times daily since 2021. She describes the urgency as 'when I have to go, I got to go' and sometimes experiences a 'burst' of small amounts. No nocturnal symptoms, abdominal pain, or rectal pain associated with her bowel movements. No gas, bloating, or gurgling.  She has experienced a weight loss of about 20 pounds since December of the previous year, attributed to stress. Her appetite is present, but she has not been eating well due to personal circumstances. No upper gastrointestinal symptoms such as nausea, vomiting, or heartburn.  Her past medical history includes breast cancer treated with a left mastectomy in 2000. She has a family history of colon cancer in her maternal aunt. She was due for a follow-up colonoscopy in 2021, which was delayed due to personal and family health issues.  She is currently on Lexapro , which she has been taking for an extended period without recent changes in dosage. She occasionally uses ibuprofen  but denies  regular use of over-the-counter medications or supplements. She does not consume alcohol or smoke.  She reports a history of myelofibrosis and notes that her hemoglobin has been trending down. She follows up with a cancer center every three months and had  a CT scan in 2016 or 2017. She has not had any recent imaging of her abdomen since then.        She  reports that she has never smoked. She has never used smokeless tobacco. She reports that she does not drink alcohol and does not use drugs.  RELEVANT GI HISTORY, IMAGING AND LABS: Results   LABS Hemoglobin: Decreased Fecal occult blood test: Negative (10/02/2024)  DIAGNOSTIC Colonoscopy: Benign polyps (08/13/2015)      CBC    Component Value Date/Time   WBC 8.5 09/05/2024 0843   WBC 5.2 11/20/2019 1116   RBC 2.85 (L) 09/05/2024 0843   HGB 7.9 (L) 09/05/2024 0843   HGB 10.8 (L) 10/07/2017 1540   HCT 25.3 (L) 09/05/2024 0843   HCT 33.6 (L) 10/07/2017 1540   PLT 142 (L) 09/05/2024 0843   PLT 191 10/07/2017 1540   MCV 88.8 09/05/2024 0843   MCV 88.0 10/07/2017 1540   MCH 27.7 09/05/2024 0843   MCHC 31.2 09/05/2024 0843   RDW 18.9 (H) 09/05/2024 0843   RDW 17.8 (H) 10/07/2017 1540   LYMPHSABS 2.3 09/05/2024 0843   LYMPHSABS 1.5 11/03/2016 1522   MONOABS 0.3 09/05/2024 0843   MONOABS 0.7 11/03/2016 1522   EOSABS 0.0 09/05/2024 0843   EOSABS 0.0 11/03/2016 1522   BASOSABS 0.0 09/05/2024 0843   BASOSABS 0.1 11/03/2016 1522   Recent Labs    11/25/23 1413 04/10/24 1440 09/05/24 0843  HGB 8.8* 8.5* 7.9*    CMP     Component Value Date/Time   NA 138 09/05/2024 0843   NA 137 10/07/2017 1540   K 4.2 09/05/2024 0843   K 3.7 10/07/2017 1540   CL 105 09/05/2024 0843   CO2 21 (L) 09/05/2024 0843   CO2 23 10/07/2017 1540   GLUCOSE 88 09/05/2024 0843   GLUCOSE 100 10/07/2017 1540   BUN 15 09/05/2024 0843   BUN 14.3 10/07/2017 1540   CREATININE 0.80 09/05/2024 0843   CREATININE 0.8 10/07/2017 1540   CALCIUM 9.2 09/05/2024 0843   CALCIUM 9.3 10/07/2017 1540   PROT 6.6 09/05/2024 0843   PROT 7.3 10/07/2017 1540   ALBUMIN 4.3 09/05/2024 0843   ALBUMIN 4.2 10/07/2017 1540   AST 22 09/05/2024 0843   AST 22 10/07/2017 1540   ALT 8 09/05/2024 0843   ALT 16 10/07/2017  1540   ALKPHOS 68 09/05/2024 0843   ALKPHOS 65 10/07/2017 1540   BILITOT 0.4 09/05/2024 0843   BILITOT 0.61 10/07/2017 1540   GFRNONAA >60 09/05/2024 0843   GFRAA 59 (L) 06/12/2020 1539      Latest Ref Rng & Units 09/05/2024    8:43 AM 04/10/2024    2:40 PM 11/25/2023    2:13 PM  Hepatic Function  Total Protein 6.5 - 8.1 g/dL 6.6  6.5  6.9   Albumin 3.5 - 5.0 g/dL 4.3  4.1  4.3   AST 15 - 41 U/L 22  26  16    ALT 0 - 44 U/L 8  8  10    Alk Phosphatase 38 - 126 U/L 68  80  61   Total Bilirubin 0.0 - 1.2 mg/dL 0.4  0.3  0.4       Current Medications:  Current Outpatient Medications (Analgesics):    HYDROcodone -acetaminophen  (NORCO/VICODIN) 5-325 MG tablet, Take 1 tablet by mouth every 4 (four) hours as needed.   ibuprofen  (ADVIL ) 800 MG tablet, Take 1 tablet (800 mg total) by mouth 2 (two) times daily as needed for pain. (Minimize use if possible- increases stomach bleeding risk, heart risk, and kidney function issues)   Current Outpatient Medications (Other):    ALPRAZolam  (XANAX ) 0.5 MG tablet, Take 1 tablet (0.5 mg total) by mouth 2 (two) times daily as needed for anxiety   escitalopram  (LEXAPRO ) 20 MG tablet, Take 1 tablet (20 mg total) by mouth daily.   Na Sulfate-K Sulfate-Mg Sulfate concentrate (SUPREP) 17.5-3.13-1.6 GM/177ML SOLN, Take 1 kit (354 mLs total) by mouth once for 1 dose.   ruxolitinib  phosphate (JAKAFI ) 15 MG tablet, TAKE 15 MG (1 TABLET) BY MOUTH ALTERNATING WITH 30 MG (2 TABLETS) DAILY   zolpidem  (AMBIEN ) 10 MG tablet, Take 1 tablet (10 mg) by mouth at bedtime as needed for sleep. Try ONE-HALF tablet (5 mg) first. Do not take within 8 hours of alprazolam . Do not drive within 8 hours after taking.  Medical History:  Past Medical History:  Diagnosis Date   Breast cancer (HCC)    Myelofibrosis (HCC)    Allergies:  Allergies  Allergen Reactions   Clarithromycin Rash     Surgical History:  She  has a past surgical history that includes Breast surgery  and Abdominal hysterectomy. Family History:  Her family history includes Atrial fibrillation in her mother; Breast cancer in her cousin, maternal aunt, and other family members; Colon cancer in her cousin; Diabetes in her mother; Healthy in her brother; Hypertension in her mother; Leukemia (age of onset: 4) in her cousin; Lung cancer in her maternal aunt and maternal grandmother; Other in her mother, paternal grandfather, and paternal grandmother; Pancreatic cancer in her father; Prostate cancer in an other family member.  REVIEW OF SYSTEMS  : All other systems reviewed and negative except where noted in the History of Present Illness.  PHYSICAL EXAM: BP 136/64 (BP Location: Right Arm, Patient Position: Sitting, Cuff Size: Normal)   Pulse 86   Ht 5' 7 (1.702 m)   Wt 137 lb 6 oz (62.3 kg)   BMI 21.52 kg/m  Physical Exam   GENERAL APPEARANCE: Well nourished, in no apparent distress. HEENT: No cervical lymphadenopathy, unremarkable thyroid, sclerae anicteric, conjunctiva pink. RESPIRATORY: Respiratory effort normal, BS equal bilateral without rales, rhonchi, wheezing. CARDIO: RRR with no MRGs, peripheral pulses intact. ABDOMEN: Soft, non distended, active bowel sounds in all 4 quadrants, no tenderness to palpation, no rebound, no mass appreciated. Splenomegaly noted. RECTAL: No external hemorrhoids, fissures, or rashes. No rectal masses or abnormalities. Stool hemoccult negative. MUSCULOSKELETAL: Full ROM, normal gait, without edema. SKIN: Dry, intact without rashes or lesions. No jaundice. NEURO: Alert, oriented, no focal deficits. PSYCH: Cooperative, normal mood and affect.      Alan JONELLE Coombs, PA-C 9:13 AM

## 2024-10-02 NOTE — Progress Notes (Signed)
 Specialty Pharmacy Refill Coordination Note  [name] is a 58 y.o. female contacted today regarding refills of specialty medication(s) Ruxolitinib  Phosphate (JAKAFI )   Patient requested (Patient-Rptd) Delivery   Delivery date: 10/10/24   Verified address: (Patient-Rptd) [address]   Medication will be filled on: 10/09/24

## 2024-10-02 NOTE — Patient Instructions (Addendum)
 Your provider has requested that you go to the basement level for lab work before leaving today. Press B on the elevator. The lab is located at the first door on the left as you exit the elevator.  Due to recent changes in healthcare laws, you may see the results of your imaging and laboratory studies on MyChart before your provider has had a chance to review them.  We understand that in some cases there may be results that are confusing or concerning to you. Not all laboratory results come back in the same time frame and the provider may be waiting for multiple results in order to interpret others.  Please give us  48 hours in order for your provider to thoroughly review all the results before contacting the office for clarification of your results.    FIBER SUPPLEMENT You can do metamucil or fibercon once or twice a day but if this causes gas/bloating please switch to Benefiber or Citracel.  Fiber is good for constipation/diarrhea/irritable bowel syndrome.  It can also help with weight loss and can help lower your bad cholesterol (LDL).  Please do 1 TBSP in the morning in water, coffee, or tea.  It can take up to a month before you can see a difference with your bowel movements.  It is cheapest from [employer], [employer], [employer].   You have been scheduled for a colonoscopy. Please follow written instructions given to you at your visit today.   If you use inhalers (even only as needed), please bring them with you on the day of your procedure.  DO NOT TAKE 7 DAYS PRIOR TO TEST- Trulicity (dulaglutide) Ozempic, Wegovy (semaglutide) Mounjaro, Zepbound (tirzepatide) Bydureon Bcise (exanatide extended release)  DO NOT TAKE 1 DAY PRIOR TO YOUR TEST Rybelsus (semaglutide) Adlyxin (lixisenatide) Victoza (liraglutide) Byetta (exanatide) ___________________________________________________________________________   Thank you for trusting me with your gastrointestinal care!   Alan Coombs,  PA-C  _______________________________________________________  If your blood pressure at your visit was 140/90 or greater, please contact your primary care physician to follow up on this.  _______________________________________________________  If you are age 60 or older, your body mass index should be between 23-30. Your Body mass index is 21.52 kg/m. If this is out of the aforementioned range listed, please consider follow up with your Primary Care Provider.  If you are age 7 or younger, your body mass index should be between 19-25. Your Body mass index is 21.52 kg/m. If this is out of the aformentioned range listed, please consider follow up with your Primary Care Provider.   ________________________________________________________  The [facility] providers would like to encourage you to use MYCHART to communicate with providers for non-urgent requests or questions.  Due to long hold times on the telephone, sending your provider a message by [facility] may be a faster and more efficient way to get a response.  Please allow 48 business hours for a response.  Please remember that this is for non-urgent requests.  _______________________________________________________  [facility] is using a team-based approach to care.  Your team is made up of your doctor and two to three APPS. Our APPS (Nurse Practitioners and Physician Assistants) work with your physician to ensure care continuity for you. They are fully qualified to address your health concerns and develop a treatment plan. They communicate directly with your gastroenterologist to care for you. Seeing the Advanced Practice Practitioners on your physician's team can help you by facilitating care more promptly, often allowing for earlier appointments, access to diagnostic testing,  procedures, and other specialty referrals.     FODMAP stands for fermentable oligo-, di-, mono-saccharides and polyols (1). These are the scientific  terms used to classify groups of carbs that are difficult for our body to digest and that are notorious for triggering digestive symptoms like bloating, gas, loose stools and stomach pain.   You can try low FODMAP diet  - start with eliminating just one column at a time that you feel may be a trigger for you. - the table at the very bottom contains foods that are low in FODMAPs   Sometimes trying to eliminate the FODMAP's from your diet is difficult or tricky, if you are stuggling with trying to do the elimination diet you can try an enzyme.  There is a food enzymes that you sprinkle in or on your food that helps break down the FODMAP. You can read more about the enzyme by going to this site: https://fodzyme.com/

## 2024-10-03 ENCOUNTER — Ambulatory Visit: Payer: Self-pay | Admitting: Physician Assistant

## 2024-10-03 ENCOUNTER — Telehealth: Payer: Self-pay

## 2024-10-03 DIAGNOSIS — R161 Splenomegaly, not elsewhere classified: Secondary | ICD-10-CM

## 2024-10-03 LAB — IGA: Immunoglobulin A: 150 mg/dL (ref 47–310)

## 2024-10-03 LAB — TISSUE TRANSGLUTAMINASE, IGA: (tTG) Ab, IgA: <1 U/mL

## 2024-10-03 NOTE — Telephone Encounter (Signed)
-----   Message from Alan JONELLE Coombs sent at 10/02/2024  6:10 PM EST ----- Regarding: FW:  I talked with Dr. San, can we get CT Ab pelvis with contrast to evaluate splenomegaly and AB pain prior to Colonoscopy? Get urgent if need be, he wants to make sure there is not a chance of spleen injury during Colonoscopy as well as making sure no need for upper endoscopy.  Thanks,  Alan  ----- Message ----- From: San Sandor GAILS, DO Sent: 10/02/2024   5:28 PM EST To: Alan JONELLE Coombs, PA-C

## 2024-10-03 NOTE — Telephone Encounter (Signed)
 CT order in epic. Secure staff message sent to radiology scheduling to contact patient to schedule CT appt. MyChart message sent to patient with recommendations.

## 2024-10-06 NOTE — Telephone Encounter (Signed)
 CT scheduled for 10/09/24

## 2024-10-09 ENCOUNTER — Ambulatory Visit (HOSPITAL_COMMUNITY)
Admission: RE | Admit: 2024-10-09 | Discharge: 2024-10-09 | Disposition: A | Discharge status: Home / Self Care | Source: Ambulatory Visit | Attending: Physician Assistant | Admitting: Physician Assistant

## 2024-10-09 ENCOUNTER — Other Ambulatory Visit: Payer: Self-pay

## 2024-10-09 DIAGNOSIS — R161 Splenomegaly, not elsewhere classified: Secondary | ICD-10-CM | POA: Diagnosis not present

## 2024-10-09 DIAGNOSIS — R109 Unspecified abdominal pain: Secondary | ICD-10-CM | POA: Diagnosis not present

## 2024-10-09 DIAGNOSIS — K8020 Calculus of gallbladder without cholecystitis without obstruction: Secondary | ICD-10-CM | POA: Diagnosis not present

## 2024-10-09 MED ORDER — IOHEXOL 300 MG/ML  SOLN
100.0000 mL | Freq: Once | INTRAMUSCULAR | Status: AC | PRN
  Administered 2024-10-09: 100 mL via INTRAVENOUS

## 2024-10-13 ENCOUNTER — Ambulatory Visit: Payer: Self-pay | Admitting: Physician Assistant

## 2024-10-17 ENCOUNTER — Other Ambulatory Visit: Payer: Self-pay

## 2024-10-17 DIAGNOSIS — R195 Other fecal abnormalities: Secondary | ICD-10-CM

## 2024-10-17 DIAGNOSIS — K625 Hemorrhage of anus and rectum: Secondary | ICD-10-CM

## 2024-10-17 DIAGNOSIS — D649 Anemia, unspecified: Secondary | ICD-10-CM

## 2024-10-20 ENCOUNTER — Other Ambulatory Visit: Payer: Self-pay | Admitting: Nurse Practitioner

## 2024-10-20 DIAGNOSIS — D7581 Myelofibrosis: Secondary | ICD-10-CM

## 2024-10-23 NOTE — Telephone Encounter (Signed)
 Forwarded to NP.

## 2024-10-24 ENCOUNTER — Other Ambulatory Visit: Payer: Self-pay | Admitting: Nurse Practitioner

## 2024-10-24 ENCOUNTER — Other Ambulatory Visit (HOSPITAL_BASED_OUTPATIENT_CLINIC_OR_DEPARTMENT_OTHER): Payer: Self-pay

## 2024-10-24 DIAGNOSIS — D7581 Myelofibrosis: Secondary | ICD-10-CM

## 2024-10-24 MED ORDER — HYDROCODONE-ACETAMINOPHEN 5-325 MG PO TABS
1.0000 | ORAL_TABLET | ORAL | 0 refills | Status: DC | PRN
  Filled 2024-10-24: qty 100, 17d supply, fill #0

## 2024-10-27 ENCOUNTER — Encounter: Admitting: Gastroenterology

## 2024-10-27 ENCOUNTER — Ambulatory Visit
Admission: RE | Admit: 2024-10-27 | Discharge: 2024-10-27 | Disposition: A | Source: Ambulatory Visit | Attending: Physician Assistant | Admitting: Physician Assistant

## 2024-10-27 DIAGNOSIS — R197 Diarrhea, unspecified: Secondary | ICD-10-CM | POA: Diagnosis not present

## 2024-10-27 DIAGNOSIS — R195 Other fecal abnormalities: Secondary | ICD-10-CM

## 2024-10-27 DIAGNOSIS — D649 Anemia, unspecified: Secondary | ICD-10-CM

## 2024-10-27 DIAGNOSIS — K625 Hemorrhage of anus and rectum: Secondary | ICD-10-CM

## 2024-10-30 ENCOUNTER — Other Ambulatory Visit: Payer: Self-pay | Admitting: Nurse Practitioner

## 2024-10-30 ENCOUNTER — Other Ambulatory Visit: Payer: Self-pay | Admitting: Family Medicine

## 2024-10-30 ENCOUNTER — Encounter: Admitting: Gastroenterology

## 2024-10-30 ENCOUNTER — Other Ambulatory Visit: Payer: Self-pay

## 2024-10-30 ENCOUNTER — Other Ambulatory Visit (HOSPITAL_BASED_OUTPATIENT_CLINIC_OR_DEPARTMENT_OTHER): Payer: Self-pay

## 2024-10-30 DIAGNOSIS — F411 Generalized anxiety disorder: Secondary | ICD-10-CM

## 2024-10-30 DIAGNOSIS — D7581 Myelofibrosis: Secondary | ICD-10-CM

## 2024-10-30 MED ORDER — ALPRAZOLAM 0.5 MG PO TABS
0.5000 mg | ORAL_TABLET | Freq: Two times a day (BID) | ORAL | 1 refills | Status: DC | PRN
  Filled 2024-10-30 (×2): qty 60, 30d supply, fill #0
  Filled 2024-11-27 – 2024-11-28 (×2): qty 60, 30d supply, fill #1

## 2024-10-30 MED ORDER — ZOLPIDEM TARTRATE 10 MG PO TABS
ORAL_TABLET | ORAL | 5 refills | Status: AC
  Filled 2024-10-30: qty 15, 30d supply, fill #0
  Filled 2024-11-27 – 2024-11-28 (×2): qty 15, 30d supply, fill #1
  Filled 2024-12-14 – 2024-12-26 (×5): qty 15, 30d supply, fill #2

## 2024-10-31 ENCOUNTER — Other Ambulatory Visit (HOSPITAL_BASED_OUTPATIENT_CLINIC_OR_DEPARTMENT_OTHER): Payer: Self-pay

## 2024-11-01 ENCOUNTER — Other Ambulatory Visit: Payer: Self-pay

## 2024-11-01 ENCOUNTER — Other Ambulatory Visit: Payer: Self-pay | Admitting: Oncology

## 2024-11-01 ENCOUNTER — Other Ambulatory Visit (HOSPITAL_COMMUNITY): Payer: Self-pay

## 2024-11-01 ENCOUNTER — Encounter: Payer: Self-pay | Admitting: Family Medicine

## 2024-11-01 ENCOUNTER — Ambulatory Visit: Admitting: Family Medicine

## 2024-11-01 VITALS — BP 118/62 | HR 87 | Temp 97.8°F | Ht 67.0 in | Wt 134.0 lb

## 2024-11-01 DIAGNOSIS — F5101 Primary insomnia: Secondary | ICD-10-CM

## 2024-11-01 DIAGNOSIS — D7581 Myelofibrosis: Secondary | ICD-10-CM | POA: Diagnosis not present

## 2024-11-01 DIAGNOSIS — Z853 Personal history of malignant neoplasm of breast: Secondary | ICD-10-CM

## 2024-11-01 DIAGNOSIS — F418 Other specified anxiety disorders: Secondary | ICD-10-CM

## 2024-11-01 DIAGNOSIS — F411 Generalized anxiety disorder: Secondary | ICD-10-CM

## 2024-11-01 MED ORDER — RUXOLITINIB PHOSPHATE 15 MG PO TABS
ORAL_TABLET | ORAL | 1 refills | Status: AC
  Filled 2024-11-01: qty 45, fill #0
  Filled 2024-11-03: qty 45, 30d supply, fill #0
  Filled 2024-12-08: qty 45, 30d supply, fill #1

## 2024-11-01 NOTE — Progress Notes (Signed)
 Phone [phone] In person visit   Subjective:   [name] is a 58 y.o. 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 90 tablet 3   HYDROcodone -acetaminophen  (NORCO/VICODIN) 5-325 MG tablet Take 1 tablet by mouth every 4 (four) hours as needed. 100 tablet 0   ibuprofen  (ADVIL ) 800 MG tablet Take 1 tablet (800 mg total) by mouth 2 (two) times daily as needed for pain. (Minimize use if possible- increases stomach bleeding risk, heart risk, and kidney function issues) 60 tablet 5   ruxolitinib  phosphate (JAKAFI ) 15 MG tablet TAKE 15 MG (1 TABLET) BY MOUTH ALTERNATING WITH 30 MG (2 TABLETS) DAILY 45 tablet 1   zolpidem  (AMBIEN ) 10 MG tablet Take 1 tablet (10 mg) by mouth at bedtime as needed for sleep. Try ONE-HALF tablet (5 mg) first. Do not take within 8 hours of alprazolam . Do not drive within 8 hours after taking. 30 tablet 5   No current facility-administered medications for this visit.     Objective:  BP 118/62 (BP Location: Left Arm, Patient Position: Sitting, Cuff Size: Normal)   Pulse 87    Temp 97.8 F (36.6 C) (Temporal)   Ht 5' 7 (1.702 m)   Wt 134 lb (60.8 kg)   SpO2 98%   BMI 20.99 kg/m  Gen: NAD, resting comfortably CV: RRR no murmurs rubs or gallops Lungs: CTAB no crackles, wheeze, rhonchi Abdomen: /nondistended/normal bowel sounds. Ext: no edema Skin: warm, dry     Assessment and Plan   #social update- lost her husband in October to cancer battle  # Health maintenance-  virtual Colonoscopy-reports completed just 5 days ago-2 polyps noted- we are waiitng on gastroenterology for next steps- potential colonoscopy? With polyps but some risk with her ongoing splenomegaly related to myelofibrosis  -thankfully no more blood. Ibuprofen  obviously increases risk some but hankfully no recurrence.   # Myelofibrosis S: Medication: Ruxolitinib -hot flashes are better on this -Has chronic anemia related to this - Severe pain and remains on hydrocodone  related to this from oncology - We also prescribe ibuprofen  and 800 milligrams to be used up to twice a day-we monitor renal function every 6 months- she only takes about up to twice a week A/P: myelofibrosis ongoing severe pain but on stable medication regimen. Just had renal function checked within a month and stable in regards to the ibpurofen we prescribe- contiue to check every 6 months   #screening hyperlipidemia S: Medication:none  Lab Results  Component Value Date   CHOL  69 09/05/2024   HDL 18 (L) 09/05/2024   LDLCALC 20 09/05/2024   LDLDIRECT 77.1 09/26/2020   TRIG 154 (H) 09/05/2024   CHOLHDL 3.8 09/05/2024   A/P: very mildly high triglyceride(s) but certainly not in range for treatment(s) - her LDL is phenomenally low which should be protective though HDL is low- exercise challenging with pain - appreciate Dr. [facility] drawing these labs!    # Depression/anxiety/insomnia/grieving S: Medication:Lexapro  20 mg daily, Ambien  10 mg -Poor control on sertraline , Prozac  with nausea, citalopram  not very  effective  Stressors: lost husband October 2025,  lost parents in 2023, chronic pain from myelofibrosis    11/01/2024    9:18 AM 09/05/2024    8:58 AM 05/02/2024    4:17 PM  Depression screen PHQ 2/9  Decreased Interest 0 0 1  Down, Depressed, Hopeless 0 0 1  PHQ - 2 Score 0 0 2  Altered sleeping 0  1  Tired, decreased energy 3  1  Change in appetite 0  0  Feeling bad or failure about yourself  0  0  Trouble concentrating 0  1  Moving slowly or fidgety/restless 0  0  Suicidal thoughts 0  0  PHQ-9 Score 3  5   Difficult doing work/chores Not difficult at all  Not difficult at all     Data saved with a previous flowsheet row definition      11/01/2024    9:19 AM 05/02/2024    4:17 PM 11/02/2023    4:06 PM 02/01/2023    2:56 PM  GAD 7 : Generalized Anxiety Score  Nervous, Anxious, on Edge 1 1 1 2   Control/stop worrying 0 1 2 2   Worry too much - different things 0 1 2 2   Trouble relaxing 2 1 2 2   Restless 0 1 2 2   Easily annoyed or irritable 0 0 2 2  Afraid - awful might happen 0 1 2 1   Total GAD 7 Score 3 6 13 13   Anxiety Difficulty Not difficult at all Not difficult at all Not difficult at all Somewhat difficult  A/P: generalized anxiety disorder with full remission even with recent loss- she is doing as best as possible- taking this day by day  Recommended follow up: Return in about 6 months (around 05/02/2025) for followup or sooner if needed.Schedule b4 you leave. Future Appointments  Date Time Provider Department Center  12/05/2024  9:15 AM [facility] PHLEBOTOMIST [facility] None  12/05/2024  9:40 AM [facility] NOVAK, MD [facility] None  05/04/2025  3:20 PM Katrinka Garnette KIDD, MD [facility]    Lab/Order associations:   ICD-10-CM   1. Myelofibrosis (HCC)  D75.81     2. Primary insomnia  F51.01     3. GAD (generalized anxiety disorder)  F41.1       No orders of the defined types were placed in this encounter.   Return precautions advised.  Garnette Katrinka, MD

## 2024-11-01 NOTE — Patient Instructions (Addendum)
 No labs today since recently drawn  No changes- just awaiting next steps from Dr. San   Recommended follow up: Return in about 6 months (around 05/02/2025) for followup or sooner if needed.Schedule b4 you leave.

## 2024-11-02 ENCOUNTER — Other Ambulatory Visit: Payer: Self-pay

## 2024-11-03 ENCOUNTER — Other Ambulatory Visit (HOSPITAL_COMMUNITY): Payer: Self-pay

## 2024-11-06 ENCOUNTER — Other Ambulatory Visit: Payer: Self-pay

## 2024-11-06 ENCOUNTER — Ambulatory Visit: Payer: Self-pay | Admitting: Physician Assistant

## 2024-11-06 ENCOUNTER — Other Ambulatory Visit (HOSPITAL_BASED_OUTPATIENT_CLINIC_OR_DEPARTMENT_OTHER): Payer: Self-pay

## 2024-11-06 NOTE — Progress Notes (Signed)
 Specialty Pharmacy Refill Coordination Note  [name] is a 58 y.o. female contacted today regarding refills of specialty medication(s) Ruxolitinib  Phosphate (JAKAFI )   Patient requested Delivery   Delivery date: 11/20/24   Verified address: [address]   Medication will be filled on: 11/17/24

## 2024-11-09 ENCOUNTER — Other Ambulatory Visit (HOSPITAL_BASED_OUTPATIENT_CLINIC_OR_DEPARTMENT_OTHER): Payer: Self-pay

## 2024-11-09 ENCOUNTER — Other Ambulatory Visit: Payer: Self-pay | Admitting: Nurse Practitioner

## 2024-11-09 DIAGNOSIS — D7581 Myelofibrosis: Secondary | ICD-10-CM

## 2024-11-09 MED ORDER — HYDROCODONE-ACETAMINOPHEN 5-325 MG PO TABS
1.0000 | ORAL_TABLET | ORAL | 0 refills | Status: DC | PRN
  Filled 2024-11-09: qty 100, 17d supply, fill #0

## 2024-11-09 NOTE — Telephone Encounter (Signed)
 Forwarded to NP.

## 2024-11-17 ENCOUNTER — Other Ambulatory Visit: Payer: Self-pay

## 2024-11-25 ENCOUNTER — Other Ambulatory Visit: Payer: Self-pay | Admitting: Nurse Practitioner

## 2024-11-25 DIAGNOSIS — D7581 Myelofibrosis: Secondary | ICD-10-CM

## 2024-11-27 ENCOUNTER — Other Ambulatory Visit (HOSPITAL_BASED_OUTPATIENT_CLINIC_OR_DEPARTMENT_OTHER): Payer: Self-pay

## 2024-11-27 ENCOUNTER — Other Ambulatory Visit: Payer: Self-pay | Admitting: Nurse Practitioner

## 2024-11-27 ENCOUNTER — Other Ambulatory Visit: Payer: Self-pay

## 2024-11-27 DIAGNOSIS — D7581 Myelofibrosis: Secondary | ICD-10-CM

## 2024-11-27 MED ORDER — HYDROCODONE-ACETAMINOPHEN 5-325 MG PO TABS
1.0000 | ORAL_TABLET | ORAL | 0 refills | Status: DC | PRN
  Filled 2024-11-27: qty 100, 17d supply, fill #0

## 2024-11-27 NOTE — Telephone Encounter (Signed)
 Forwarded to NP.

## 2024-11-28 ENCOUNTER — Other Ambulatory Visit: Payer: Self-pay

## 2024-12-05 ENCOUNTER — Inpatient Hospital Stay: Admitting: Oncology

## 2024-12-05 ENCOUNTER — Inpatient Hospital Stay: Attending: Oncology

## 2024-12-05 VITALS — BP 143/68 | HR 81 | Temp 97.9°F | Resp 14 | Wt 140.3 lb

## 2024-12-05 DIAGNOSIS — D7581 Myelofibrosis: Secondary | ICD-10-CM

## 2024-12-05 LAB — CBC WITH DIFFERENTIAL (CANCER CENTER ONLY)
Abs Immature Granulocytes: 1.39 K/uL — ABNORMAL HIGH (ref 0.00–0.07)
Basophils Absolute: 0.1 K/uL (ref 0.0–0.1)
Basophils Relative: 1 %
Eosinophils Absolute: 0 K/uL (ref 0.0–0.5)
Eosinophils Relative: 1 %
HCT: 24.5 % — ABNORMAL LOW (ref 36.0–46.0)
Hemoglobin: 7.5 g/dL — ABNORMAL LOW (ref 12.0–15.0)
Immature Granulocytes: 20 %
Lymphocytes Relative: 19 %
Lymphs Abs: 1.3 K/uL (ref 0.7–4.0)
MCH: 28.2 pg (ref 26.0–34.0)
MCHC: 30.6 g/dL (ref 30.0–36.0)
MCV: 92.1 fL (ref 80.0–100.0)
Monocytes Absolute: 0.6 K/uL (ref 0.1–1.0)
Monocytes Relative: 9 %
Neutro Abs: 3.5 K/uL (ref 1.7–7.7)
Neutrophils Relative %: 50 %
Platelet Count: 118 K/uL — ABNORMAL LOW (ref 150–400)
RBC: 2.66 MIL/uL — ABNORMAL LOW (ref 3.87–5.11)
RDW: 18.8 % — ABNORMAL HIGH (ref 11.5–15.5)
WBC Count: 6.9 K/uL (ref 4.0–10.5)
nRBC: 2.6 % — ABNORMAL HIGH (ref 0.0–0.2)

## 2024-12-05 LAB — CMP (CANCER CENTER ONLY)
ALT: 16 U/L (ref 0–44)
AST: 26 U/L (ref 15–41)
Albumin: 4.3 g/dL (ref 3.5–5.0)
Alkaline Phosphatase: 79 U/L (ref 38–126)
Anion gap: 12 (ref 5–15)
BUN: 23 mg/dL — ABNORMAL HIGH (ref 6–20)
CO2: 20 mmol/L — ABNORMAL LOW (ref 22–32)
Calcium: 9.1 mg/dL (ref 8.9–10.3)
Chloride: 106 mmol/L (ref 98–111)
Creatinine: 0.84 mg/dL (ref 0.44–1.00)
GFR, Estimated: >60 mL/min
Glucose, Bld: 102 mg/dL — ABNORMAL HIGH (ref 70–99)
Potassium: 4 mmol/L (ref 3.5–5.1)
Sodium: 138 mmol/L (ref 135–145)
Total Bilirubin: 0.3 mg/dL (ref 0.0–1.2)
Total Protein: 6.7 g/dL (ref 6.5–8.1)

## 2024-12-05 LAB — SAMPLE TO BLOOD BANK

## 2024-12-05 NOTE — Progress Notes (Signed)
 " [facility] OFFICE PROGRESS NOTE   Diagnosis: Myelofibrosis  INTERVAL HISTORY:   [name] returns as scheduled.  She continues Jakafi .  No fever or night sweats.  Good appetite.  No abdominal pain.  She has diffuse bone pain.  She takes hydrocodone  and ibuprofen  as needed. She underwent a virtual colonoscopy in November.  She was noted to have massive splenomegaly, 2 right sided polyps, a left adnexal cyst, and diffuse bone marrow heterogeneity.  A CT abdomen/pelvis revealed 2 left lower lobe nodules. She is scheduled for follow-up with gastroenterology this month.  Objective:  Vital signs in last 24 hours:  Blood pressure (!) 143/68, pulse 81, temperature 97.9 F (36.6 C), temperature source Temporal, resp. rate 14, weight 140 lb 4.8 oz (63.6 kg), SpO2 100%.   Resp: Lungs clear bilaterally Cardio: Regular rate and rhythm GI: The spleen is palpable throughout the left abdomen extending across the midline into the level of the iliac Vascular: No leg edema   Lab Results:  Lab Results  Component Value Date   WBC 6.9 12/05/2024   HGB 7.5 (L) 12/05/2024   HCT 24.5 (L) 12/05/2024   MCV 92.1 12/05/2024   PLT 118 (L) 12/05/2024   NEUTROABS 3.5 12/05/2024    CMP  Lab Results  Component Value Date   NA 138 12/05/2024   K 4.0 12/05/2024   CL 106 12/05/2024   CO2 20 (L) 12/05/2024   GLUCOSE 102 (H) 12/05/2024   BUN 23 (H) 12/05/2024   CREATININE 0.84 12/05/2024   CALCIUM 9.1 12/05/2024   PROT 6.7 12/05/2024   ALBUMIN 4.3 12/05/2024   AST 26 12/05/2024   ALT 16 12/05/2024   ALKPHOS 79 12/05/2024   BILITOT 0.3 12/05/2024   GFRNONAA >60 12/05/2024   GFRAA 59 (L) 06/12/2020    No results found for: CEA1, CEA, CAN199, CA125  No results found for: INR, LABPROT  Imaging:  No results found.  Medications: I have reviewed the patient's current medications.   Assessment/Plan:  Stage II left-sided breast cancer diagnosed in February 2000.  Screening mammogram 10/05/2011 with suggestion of a subcentimeter oval mass on the left superiorly and no suspicious masses, calcifications or architectural distortion in the right breast. She underwent biopsy of the asymmetric density of concern in the superior aspect of the right breast on 10/13/2011. Pathology showed a fibroadenoma.   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 History of intermittent low-grade fever and night sweats, likely systemic manifestations of myelofibrosis. History of Nose bleeding-likely related to myelofibrosis with dysfunctional platelets 7. 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   8. Elevated creatinine July 2015-normal 08/06/2014, potentially related to blood pressure medication   9.  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  Increased low back pain July 2020 MRI lumbar spine 06/26/2019- diffuse T1 signal compatible with myelofibrosis, hyperintense T2 signal at T12 and L3, most  likely hemangiomas Bone scan 07/04/2019- diffuse increased and homogenous uptake compatible with myelofibrosis, no focal increased activity at T12 or L3 11.  Exertional dyspnea-etiology unclear- evaluated by cardiology 12.  10/09/2024 CT abdomen/pelvis:  Massive splenomegaly, diffuse mottled appearance of the bones, 2 left lower lobe nodules measuring up to 4 mm left adnexal cyst 13.  10/27/2024 virtual colonoscopy-2 right sided polyps    Disposition: [name] has myelofibrosis.  The hemoglobin is slowly decreasing, likely secondary to progressive bone marrow fibrosis.  We discussed changing to a different systemic therapy.  She would like to continue ruxolitinib .  She will call for symptoms of anemia.  She will return for an office and lab visit in 3 months. [name] will follow-up with gastroenterology to consider options for managing the colon polyps.  The bone findings on the CT images are unlikely related to myelofibrosis.  I have low clinical suspicion for metastatic breast cancer.  The small lung nodules are likely benign.  She will be scheduled for a repeat chest CT in 3 months.  Arley Hof, MD  12/05/2024  10:00 AM   "

## 2024-12-08 ENCOUNTER — Other Ambulatory Visit (HOSPITAL_COMMUNITY): Payer: Self-pay

## 2024-12-12 ENCOUNTER — Other Ambulatory Visit: Payer: Self-pay

## 2024-12-13 ENCOUNTER — Other Ambulatory Visit (HOSPITAL_BASED_OUTPATIENT_CLINIC_OR_DEPARTMENT_OTHER): Payer: Self-pay

## 2024-12-13 ENCOUNTER — Other Ambulatory Visit: Payer: Self-pay | Admitting: Nurse Practitioner

## 2024-12-13 DIAGNOSIS — D7581 Myelofibrosis: Secondary | ICD-10-CM

## 2024-12-13 MED ORDER — HYDROCODONE-ACETAMINOPHEN 5-325 MG PO TABS
1.0000 | ORAL_TABLET | ORAL | 0 refills | Status: AC | PRN
  Filled 2024-12-13: qty 100, 17d supply, fill #0

## 2024-12-14 ENCOUNTER — Other Ambulatory Visit (HOSPITAL_BASED_OUTPATIENT_CLINIC_OR_DEPARTMENT_OTHER): Payer: Self-pay

## 2024-12-14 ENCOUNTER — Other Ambulatory Visit: Payer: Self-pay | Admitting: Pharmacy Technician

## 2024-12-14 ENCOUNTER — Other Ambulatory Visit: Payer: Self-pay

## 2024-12-14 ENCOUNTER — Other Ambulatory Visit: Payer: Self-pay | Admitting: Oncology

## 2024-12-14 ENCOUNTER — Encounter (INDEPENDENT_AMBULATORY_CARE_PROVIDER_SITE_OTHER): Payer: Self-pay

## 2024-12-14 ENCOUNTER — Other Ambulatory Visit: Payer: Self-pay | Admitting: Nurse Practitioner

## 2024-12-14 DIAGNOSIS — D7581 Myelofibrosis: Secondary | ICD-10-CM

## 2024-12-14 MED ORDER — ALPRAZOLAM 0.5 MG PO TABS
0.5000 mg | ORAL_TABLET | Freq: Two times a day (BID) | ORAL | 1 refills | Status: AC | PRN
  Filled 2024-12-14 – 2024-12-26 (×4): qty 60, 30d supply, fill #0

## 2024-12-14 MED ORDER — ALPRAZOLAM 0.5 MG PO TABS: 0.5000 mg | ORAL_TABLET | Freq: Two times a day (BID) | ORAL | 1 refills | Status: DC | PRN

## 2024-12-14 NOTE — Progress Notes (Signed)
 Specialty Pharmacy Refill Coordination Note  [name] is a 59 y.o. female contacted today regarding refills of specialty medication(s) Ruxolitinib  Phosphate (JAKAFI )   Patient requested Delivery   Delivery date: 12/21/24   Verified address: [address]   Medication will be filled on: 12/20/24

## 2024-12-15 ENCOUNTER — Other Ambulatory Visit (HOSPITAL_BASED_OUTPATIENT_CLINIC_OR_DEPARTMENT_OTHER): Payer: Self-pay

## 2024-12-18 ENCOUNTER — Ambulatory Visit: Admitting: Gastroenterology

## 2024-12-20 ENCOUNTER — Other Ambulatory Visit: Payer: Self-pay

## 2024-12-20 ENCOUNTER — Other Ambulatory Visit (HOSPITAL_COMMUNITY): Payer: Self-pay

## 2024-12-20 ENCOUNTER — Other Ambulatory Visit (HOSPITAL_BASED_OUTPATIENT_CLINIC_OR_DEPARTMENT_OTHER): Payer: Self-pay

## 2024-12-20 MED ORDER — ACETAMINOPHEN-CODEINE 300-30 MG PO TABS
1.0000 | ORAL_TABLET | Freq: Four times a day (QID) | ORAL | 0 refills | Status: AC | PRN
  Filled 2024-12-20 (×2): qty 12, 3d supply, fill #0

## 2024-12-26 ENCOUNTER — Other Ambulatory Visit: Payer: Self-pay

## 2024-12-26 ENCOUNTER — Other Ambulatory Visit (HOSPITAL_BASED_OUTPATIENT_CLINIC_OR_DEPARTMENT_OTHER): Payer: Self-pay

## 2024-12-28 ENCOUNTER — Other Ambulatory Visit (HOSPITAL_COMMUNITY): Payer: Self-pay

## 2025-03-05 ENCOUNTER — Inpatient Hospital Stay

## 2025-03-05 ENCOUNTER — Inpatient Hospital Stay: Admitting: Oncology

## 2025-05-04 ENCOUNTER — Ambulatory Visit: Admitting: Family Medicine

## 2025-05-11 ENCOUNTER — Ambulatory Visit: Admitting: Family Medicine
